# Patient Record
Sex: Female | Born: 1980 | Race: White | Hispanic: No | Smoking: Never smoker
Health system: Southern US, Community
[De-identification: ages and names within clinical notes are randomized; demographics above are authoritative.]

## PROBLEM LIST (undated history)

## (undated) VITALS — BP 113/89 | HR 106 | Temp 97.6°F | Resp 17 | Ht 67.0 in | Wt 161.0 lb

## (undated) DIAGNOSIS — M3500 Sicca syndrome, unspecified: Secondary | ICD-10-CM

## (undated) DIAGNOSIS — R17 Unspecified jaundice: Secondary | ICD-10-CM

## (undated) DIAGNOSIS — J45909 Unspecified asthma, uncomplicated: Secondary | ICD-10-CM

## (undated) DIAGNOSIS — R748 Abnormal levels of other serum enzymes: Secondary | ICD-10-CM

## (undated) HISTORY — PX: ORIF FINGER / THUMB FRACTURE: SUR932

## (undated) HISTORY — PX: TONSILLECTOMY AND ADENOIDECTOMY: SHX28

## (undated) HISTORY — PX: SUBMANDIBULAR GLAND EXCISION: SHX2456

---

## 2013-05-16 DIAGNOSIS — F339 Major depressive disorder, recurrent, unspecified: Secondary | ICD-10-CM | POA: Insufficient documentation

## 2016-01-28 DIAGNOSIS — E538 Deficiency of other specified B group vitamins: Secondary | ICD-10-CM | POA: Insufficient documentation

## 2016-07-20 DIAGNOSIS — M3500 Sicca syndrome, unspecified: Secondary | ICD-10-CM | POA: Insufficient documentation

## 2016-07-20 DIAGNOSIS — M26609 Unspecified temporomandibular joint disorder, unspecified side: Secondary | ICD-10-CM | POA: Insufficient documentation

## 2016-09-22 DIAGNOSIS — N870 Mild cervical dysplasia: Secondary | ICD-10-CM | POA: Insufficient documentation

## 2020-12-18 ENCOUNTER — Other Ambulatory Visit: Payer: Self-pay

## 2020-12-18 ENCOUNTER — Encounter (HOSPITAL_BASED_OUTPATIENT_CLINIC_OR_DEPARTMENT_OTHER): Payer: Self-pay | Admitting: *Deleted

## 2020-12-18 ENCOUNTER — Emergency Department (HOSPITAL_BASED_OUTPATIENT_CLINIC_OR_DEPARTMENT_OTHER): Payer: 59

## 2020-12-18 ENCOUNTER — Emergency Department (HOSPITAL_BASED_OUTPATIENT_CLINIC_OR_DEPARTMENT_OTHER)
Admission: EM | Admit: 2020-12-18 | Discharge: 2020-12-18 | Disposition: A | Discharge status: Home / Self Care | Payer: 59 | Attending: Emergency Medicine | Admitting: Emergency Medicine

## 2020-12-18 DIAGNOSIS — R748 Abnormal levels of other serum enzymes: Secondary | ICD-10-CM

## 2020-12-18 DIAGNOSIS — R7401 Elevation of levels of liver transaminase levels: Secondary | ICD-10-CM | POA: Diagnosis not present

## 2020-12-18 DIAGNOSIS — J45909 Unspecified asthma, uncomplicated: Secondary | ICD-10-CM | POA: Diagnosis not present

## 2020-12-18 DIAGNOSIS — R110 Nausea: Secondary | ICD-10-CM | POA: Insufficient documentation

## 2020-12-18 DIAGNOSIS — N3000 Acute cystitis without hematuria: Secondary | ICD-10-CM | POA: Insufficient documentation

## 2020-12-18 DIAGNOSIS — D72829 Elevated white blood cell count, unspecified: Secondary | ICD-10-CM | POA: Insufficient documentation

## 2020-12-18 DIAGNOSIS — R109 Unspecified abdominal pain: Secondary | ICD-10-CM

## 2020-12-18 DIAGNOSIS — R630 Anorexia: Secondary | ICD-10-CM | POA: Insufficient documentation

## 2020-12-18 DIAGNOSIS — R17 Unspecified jaundice: Secondary | ICD-10-CM | POA: Diagnosis not present

## 2020-12-18 DIAGNOSIS — K824 Cholesterolosis of gallbladder: Secondary | ICD-10-CM | POA: Diagnosis not present

## 2020-12-18 DIAGNOSIS — R1011 Right upper quadrant pain: Secondary | ICD-10-CM | POA: Diagnosis present

## 2020-12-18 DIAGNOSIS — Z91040 Latex allergy status: Secondary | ICD-10-CM | POA: Diagnosis not present

## 2020-12-18 HISTORY — DX: Abnormal levels of other serum enzymes: R74.8

## 2020-12-18 HISTORY — DX: Sjogren syndrome, unspecified: M35.00

## 2020-12-18 HISTORY — DX: Unspecified jaundice: R17

## 2020-12-18 HISTORY — DX: Unspecified asthma, uncomplicated: J45.909

## 2020-12-18 LAB — CBC WITH DIFFERENTIAL/PLATELET
Abs Immature Granulocytes: 0.02 10*3/uL (ref 0.00–0.07)
Basophils Absolute: 0.1 10*3/uL (ref 0.0–0.1)
Basophils Relative: 1 %
Eosinophils Absolute: 0.1 10*3/uL (ref 0.0–0.5)
Eosinophils Relative: 1 %
HCT: 29.4 % — ABNORMAL LOW (ref 36.0–46.0)
Hemoglobin: 10.1 g/dL — ABNORMAL LOW (ref 12.0–15.0)
Immature Granulocytes: 0 %
Lymphocytes Relative: 38 %
Lymphs Abs: 2.7 10*3/uL (ref 0.7–4.0)
MCH: 39.8 pg — ABNORMAL HIGH (ref 26.0–34.0)
MCHC: 34.4 g/dL (ref 30.0–36.0)
MCV: 115.7 fL — ABNORMAL HIGH (ref 80.0–100.0)
Monocytes Absolute: 0.9 10*3/uL (ref 0.1–1.0)
Monocytes Relative: 13 %
Neutro Abs: 3.3 10*3/uL (ref 1.7–7.7)
Neutrophils Relative %: 47 %
Platelets: 240 10*3/uL (ref 150–400)
RBC: 2.54 MIL/uL — ABNORMAL LOW (ref 3.87–5.11)
RDW: 14.4 % (ref 11.5–15.5)
Smear Review: NORMAL
WBC: 7.1 10*3/uL (ref 4.0–10.5)
nRBC: 0 % (ref 0.0–0.2)

## 2020-12-18 LAB — COMPREHENSIVE METABOLIC PANEL
ALT: 77 U/L — ABNORMAL HIGH (ref 0–44)
AST: 231 U/L — ABNORMAL HIGH (ref 15–41)
Albumin: 2.7 g/dL — ABNORMAL LOW (ref 3.5–5.0)
Alkaline Phosphatase: 308 U/L — ABNORMAL HIGH (ref 38–126)
Anion gap: 14 (ref 5–15)
BUN: 8 mg/dL (ref 6–20)
CO2: 30 mmol/L (ref 22–32)
Calcium: 9.6 mg/dL (ref 8.9–10.3)
Chloride: 94 mmol/L — ABNORMAL LOW (ref 98–111)
Creatinine, Ser: 0.63 mg/dL (ref 0.44–1.00)
GFR, Estimated: >60 mL/min (ref >60)
Glucose, Bld: 102 mg/dL — ABNORMAL HIGH (ref 70–99)
Potassium: 3.3 mmol/L — ABNORMAL LOW (ref 3.5–5.1)
Sodium: 138 mmol/L (ref 135–145)
Total Bilirubin: 3.5 mg/dL — ABNORMAL HIGH (ref 0.3–1.2)
Total Protein: 6 g/dL — ABNORMAL LOW (ref 6.5–8.1)

## 2020-12-18 LAB — PREGNANCY, URINE: Preg Test, Ur: NEGATIVE

## 2020-12-18 LAB — URINALYSIS, MICROSCOPIC (REFLEX)

## 2020-12-18 LAB — URINALYSIS, ROUTINE W REFLEX MICROSCOPIC
Glucose, UA: NEGATIVE mg/dL
Hgb urine dipstick: NEGATIVE
Ketones, ur: NEGATIVE mg/dL
Nitrite: POSITIVE — AB
Protein, ur: NEGATIVE mg/dL
Specific Gravity, Urine: 1.015 (ref 1.005–1.030)
pH: 7 (ref 5.0–8.0)

## 2020-12-18 LAB — PROTIME-INR
INR: 1.1 (ref 0.8–1.2)
Prothrombin Time: 14.2 seconds (ref 11.4–15.2)

## 2020-12-18 LAB — ACETAMINOPHEN LEVEL: Acetaminophen (Tylenol), Serum: <10 ug/mL — ABNORMAL LOW (ref 10–30)

## 2020-12-18 LAB — LIPASE, BLOOD: Lipase: 42 U/L (ref 11–51)

## 2020-12-18 MED ORDER — SODIUM CHLORIDE 0.9 % IV BOLUS
1000.0000 mL | Freq: Once | INTRAVENOUS | Status: AC
  Administered 2020-12-18: 1000 mL via INTRAVENOUS

## 2020-12-18 MED ORDER — ONDANSETRON HCL 4 MG/2ML IJ SOLN
4.0000 mg | Freq: Once | INTRAMUSCULAR | Status: AC
  Administered 2020-12-18: 4 mg via INTRAVENOUS
  Filled 2020-12-18: qty 2

## 2020-12-18 MED ORDER — CEPHALEXIN 500 MG PO CAPS: 500.0000 mg | ORAL_CAPSULE | Freq: Four times a day (QID) | ORAL | 0 refills | Status: AC

## 2020-12-18 NOTE — ED Provider Notes (Signed)
[facility] EMERGENCY DEPARTMENT Provider Note   CSN: [id number] Arrival date & time: 12/18/20  1319     History Chief Complaint  Patient presents with  . Abdominal Pain    Jeanette Elliott is a 40 y.o. female with PMH/o elevated LFTs, Jaundice, Sjogren's who presents for evaluation of abdominal pain, persistent jaundice, generalized weakness that has been ongoing for for the past couple months.  She reports that this has been ongoing for about 3 months.  She saw urgent care last month for evaluation of her symptoms and was told that her liver enzymes were elevated.  She was also told that she was jaundiced.  She was told to follow-up with her GI doctor which she has.  She was told that she would need an MRCP or an MRI which she has not got scheduled yet.  She comes in today because she still has symptoms.  She states she feels weak and has pain in her upper abdomen, particular right upper quadrant.  She has not noted any fevers but states that she feels like the jaundice is getting slightly worse.  She has had nausea but no vomiting.  She has had decreased appetite.  She states that she has lost 45 pounds in about a 84-monthtime span.  She denies any fevers, vomiting, urinary complaints, diarrhea.  She does report that she would take about 200 mg of Tylenol a day for the last for 5 days but has not excessively use Tylenol.  Denies any history of IV drug use.  Denies any history outside travel outside the country.  She denies any current alcohol drinks.  She states before this happened, she would drink about 2-3 wine glasses on the weekend.  She denies any chest pain, difficulty breathing.  The history is provided by the patient.       Past Medical History:  Diagnosis Date  . Asthma   . Elevated liver enzymes   . Jaundice   . Sjogren's syndrome ([place]     There are no problems to display for this patient.   Past Surgical History:  Procedure Laterality Date  . SUBMANDIBULAR  GLAND EXCISION    . TONSILLECTOMY       OB History   No obstetric history on file.     History reviewed. No pertinent family history.  Social History   Tobacco Use  . Smoking status: Never Smoker  . Smokeless tobacco: Never Used  Substance Use Topics  . Alcohol use: Not Currently  . Drug use: Not Currently    Home Medications Prior to Admission medications   Medication Sig Start Date End Date Taking? Authorizing Provider  cephALEXin (KEFLEX) 500 MG capsule Take 1 capsule (500 mg total) by mouth 4 (four) times daily for 7 days. 12/18/20 12/25/20 Yes LVolanda Napoleon PA-C    Allergies    Latex  Review of Systems   Review of Systems  Constitutional: Positive for unexpected weight change. Negative for fever.  Respiratory: Negative for cough and shortness of breath.   Cardiovascular: Negative for chest pain.  Gastrointestinal: Positive for abdominal pain and nausea. Negative for vomiting.  Genitourinary: Negative for dysuria and hematuria.  Skin: Positive for color change.  Neurological: Positive for weakness (generalized). Negative for headaches.  All other systems reviewed and are negative.   Physical Exam Updated Vital Signs BP 92/74 (BP Location: Left Arm)   Pulse 94   Temp 98.5 F (36.9 C) (Oral)   Resp 18   Ht  5' 7"  (1.702 m)   Wt 54.4 kg   SpO2 100%   BMI 18.79 kg/m   Physical Exam Vitals and nursing note reviewed.  Constitutional:      Appearance: Normal appearance. She is well-developed.  HENT:     Head: Normocephalic and atraumatic.  Eyes:     General: Lids are normal. Scleral icterus present.     Conjunctiva/sclera: Conjunctivae normal.     Pupils: Pupils are equal, round, and reactive to light.  Cardiovascular:     Rate and Rhythm: Normal rate and regular rhythm.     Pulses: Normal pulses.     Heart sounds: Normal heart sounds. No murmur heard. No friction rub. No gallop.   Pulmonary:     Effort: Pulmonary effort is normal.     Breath  sounds: Normal breath sounds.     Comments: Lungs clear to auscultation bilaterally.  Symmetric chest rise.  No wheezing, rales, rhonchi. Abdominal:     Palpations: Abdomen is soft. Abdomen is not rigid.     Tenderness: There is abdominal tenderness in the right upper quadrant. There is no guarding.     Comments: Abdomen is soft, non-distended.  Tenderness palpation right upper quadrant.  No rigidity, guarding.  No CVA tenderness noted bilaterally.  Musculoskeletal:        General: Normal range of motion.     Cervical back: Full passive range of motion without pain.  Skin:    General: Skin is warm and dry.     Capillary Refill: Capillary refill takes less than 2 seconds.     Coloration: Skin is jaundiced.  Neurological:     Mental Status: She is alert and oriented to person, place, and time.  Psychiatric:        Speech: Speech normal.     ED Results / Procedures / Treatments   Labs (all labs ordered are listed, but only abnormal results are displayed) Labs Reviewed  COMPREHENSIVE METABOLIC PANEL - Abnormal; Notable for the following components:      Result Value   Potassium 3.3 (*)    Chloride 94 (*)    Glucose, Bld 102 (*)    Total Protein 6.0 (*)    Albumin 2.7 (*)    AST 231 (*)    ALT 77 (*)    Alkaline Phosphatase 308 (*)    Total Bilirubin 3.5 (*)    All other components within normal limits  CBC WITH DIFFERENTIAL/PLATELET - Abnormal; Notable for the following components:   RBC 2.54 (*)    Hemoglobin 10.1 (*)    HCT 29.4 (*)    MCV 115.7 (*)    MCH 39.8 (*)    All other components within normal limits  URINALYSIS, ROUTINE W REFLEX MICROSCOPIC - Abnormal; Notable for the following components:   APPearance HAZY (*)    Bilirubin Urine MODERATE (*)    Nitrite POSITIVE (*)    Leukocytes,Ua TRACE (*)    All other components within normal limits  ACETAMINOPHEN LEVEL - Abnormal; Notable for the following components:   Acetaminophen (Tylenol), Serum <10 (*)    All  other components within normal limits  URINALYSIS, MICROSCOPIC (REFLEX) - Abnormal; Notable for the following components:   Bacteria, UA FEW (*)    All other components within normal limits  LIPASE, BLOOD  PREGNANCY, URINE  PROTIME-INR    EKG None  Radiology US Abdomen Limited RUQ (LIVER/GB)  Result Date: 12/18/2020 CLINICAL DATA:  Upper abdominal pain EXAM: ULTRASOUND ABDOMEN LIMITED RIGHT  UPPER QUADRANT COMPARISON:  None. FINDINGS: Gallbladder: There is sludge in the gallbladder. There is a 6 mm echogenic focus in the gallbladder which neither moves nor shadows, a presumed polyp. There are no echogenic foci in the gallbladder which move and shadow as is expected with cholelithiasis. There is no gallbladder wall thickening or pericholecystic fluid. No sonographic Murphy sign noted by sonographer. Common bile duct: Diameter: 2 mm. No intrahepatic or extrahepatic biliary duct dilatation. Liver: No focal lesion identified. Liver echogenicity is increased diffusely. Portal vein is patent on color Doppler imaging with normal direction of blood flow towards the liver. Other: None. IMPRESSION: 1. A 6 mm apparent polyp within the gallbladder. Per consensus guidelines, a polyp of this size does not warrant additional imaging surveillance. There is sludge in the gallbladder. No evident gallstones. No gallbladder wall thickening or pericholecystic fluid. 2. Diffuse increase in liver echogenicity, a finding indicative of hepatic steatosis. No focal liver lesions evident on this study. It must be cautioned that the sensitivity of ultrasound for detection of focal liver lesions is diminished in this circumstance. Electronically Signed   By: Lowella Grip III M.D.   On: 12/18/2020 16:03    Procedures Procedures   Medications Ordered in ED Medications  sodium chloride 0.9 % bolus 1,000 mL ( Intravenous Stopped 12/18/20 1745)  ondansetron (ZOFRAN) injection 4 mg (4 mg Intravenous Given 12/18/20 1643)    ED  Course  I have reviewed the triage vital signs and the nursing notes.  Pertinent labs & imaging results that were available during my care of the patient were reviewed by me and considered in my medical decision making (see chart for details).    MDM Rules/Calculators/A&P                          40 year old female who presents for evaluation of generalized weakness, jaundice, abdominal pain.  She reports is been ongoing for few months.  Saw urgent care last month and was full told to follow-up with GI which she has seen.  The one in her do an MRI but she has not gotten that done yet.  Comes in today's with continued symptoms.  On mission arrival, she is afebrile nontoxic-appearing.  Vital signs are stable.  She does appear jaundiced.  On exam, she has tenderness palpation on the right upper quadrant.  Concern for hepatobiliary etiology.  We will plan to check labs, imaging.  Reviewed lab work from 11/27/2020 which showed total bili of 5.8, alk phos of 187, AST of 261, ALT of 101.  Reactive hepatitis A on her hepatitis panel.  CMP today shows potassium of 3.3, AST of 231, ALT of 77, alk phos of 308, total bili of 3.5.  INR is 1.1.  Acetaminophen level is normal.  Lipase is unremarkable.  CBC shows hemoglobin of 10.1.  Ultrasound shows a 6 mm apparent polyp within the gallbladder.  No evidence of gallbladder wall thickening, pericholecystic fluid.  There is mention of potential hepatic steatosis  Urine pregnancy negative.  UA does show positive nitrites, leukocytes.  Plan to treat.  Patient with no known drug allergies.  Discussed results with mom and patient.  At this time, her LFTs are improving.  I did discuss with mom and patient that she would need further GI follow-up with further testing per their recommendation.  At this time, patient does not meet criteria for admission.  She has not having any vomiting and labs are improving.  We will give her a referral to GI.  Additionally, I have  consulted transition of care to help with instrument of her medical care. At this time, patient exhibits no emergent life-threatening condition that require further evaluation in ED. Discussed patient with Dr. Joya Gaskins who is agreeable. Patient had ample opportunity for questions and discussion. All patient's questions were answered with full understanding. Strict return precautions discussed. Patient expresses understanding and agreement to plan.   Portions of this note were generated with Lobbyist. Dictation errors may occur despite best attempts at proofreading.   Final Clinical Impression(s) / ED Diagnoses Final diagnoses:  Abdominal pain  Gallbladder polyp  Acute cystitis without hematuria  Elevated liver enzymes    Rx / DC Orders ED Discharge Orders         Ordered    cephALEXin (KEFLEX) 500 MG capsule  4 times daily        12/18/20 1853           Desma Mcgregor 12/18/20 2138    Arnaldo Natal, MD 12/18/20 2212

## 2020-12-18 NOTE — Discharge Instructions (Signed)
As we discussed, your liver enzymes are still elevated today but did appear slightly improved from your previous work-up.  We have provided you a referral to a GI doctor.  Please follow-up with them.  Additionally, we have provided you with [facility] with care to help establish further medical care.  We have also consulted our social work team.  They will be in contact with you.  As we discussed, your ultrasound today showed evidence of a gallbladder polyp.  Your urine did show evidence of infection.  We will plan to treat.  Take antibiotics as directed.  Return the emergency department for any worsening abdominal pain, persistent vomiting, fevers or any other worsening concerning symptoms.

## 2020-12-18 NOTE — ED Triage Notes (Signed)
C/o abd pain and bloating x 1 week , jaundice and increased liver enzymes x 3 months

## 2021-01-07 ENCOUNTER — Telehealth: Payer: Self-pay | Admitting: Gastroenterology

## 2021-01-07 NOTE — Telephone Encounter (Signed)
Hi Dr. Bryan Lemma, this new pt is scheduled for an appt with you on 01/22/21. I came across to her appt because her mother called inquiring about any cancellations. I realized that pt recently saw a GI at [facility] but due to her insurance being out of network she could not continue her care over there. Since we missed to advise you about that to review her records first and advise on scheduling. Would you want me to r/s her 5/12 appt? Thank you.

## 2021-01-11 ENCOUNTER — Other Ambulatory Visit: Payer: Self-pay

## 2021-01-11 ENCOUNTER — Encounter (HOSPITAL_COMMUNITY): Payer: Self-pay | Admitting: Emergency Medicine

## 2021-01-11 ENCOUNTER — Emergency Department (HOSPITAL_COMMUNITY): Payer: 59

## 2021-01-11 ENCOUNTER — Inpatient Hospital Stay (HOSPITAL_COMMUNITY)
Admission: EM | Admit: 2021-01-11 | Discharge: 2021-01-17 | DRG: 441 | Disposition: A | Discharge status: Home / Self Care | Payer: 59 | Attending: Family Medicine | Admitting: Family Medicine

## 2021-01-11 DIAGNOSIS — K219 Gastro-esophageal reflux disease without esophagitis: Secondary | ICD-10-CM | POA: Diagnosis present

## 2021-01-11 DIAGNOSIS — R140 Abdominal distension (gaseous): Secondary | ICD-10-CM | POA: Diagnosis present

## 2021-01-11 DIAGNOSIS — D589 Hereditary hemolytic anemia, unspecified: Secondary | ICD-10-CM | POA: Diagnosis present

## 2021-01-11 DIAGNOSIS — S2232XA Fracture of one rib, left side, initial encounter for closed fracture: Secondary | ICD-10-CM | POA: Diagnosis present

## 2021-01-11 DIAGNOSIS — F603 Borderline personality disorder: Secondary | ICD-10-CM | POA: Diagnosis present

## 2021-01-11 DIAGNOSIS — R17 Unspecified jaundice: Secondary | ICD-10-CM | POA: Diagnosis present

## 2021-01-11 DIAGNOSIS — J90 Pleural effusion, not elsewhere classified: Secondary | ICD-10-CM | POA: Diagnosis present

## 2021-01-11 DIAGNOSIS — S2232XD Fracture of one rib, left side, subsequent encounter for fracture with routine healing: Secondary | ICD-10-CM

## 2021-01-11 DIAGNOSIS — K7210 Chronic hepatic failure without coma: Secondary | ICD-10-CM | POA: Diagnosis present

## 2021-01-11 DIAGNOSIS — R634 Abnormal weight loss: Secondary | ICD-10-CM

## 2021-01-11 DIAGNOSIS — R188 Other ascites: Secondary | ICD-10-CM | POA: Diagnosis present

## 2021-01-11 DIAGNOSIS — E46 Unspecified protein-calorie malnutrition: Secondary | ICD-10-CM | POA: Insufficient documentation

## 2021-01-11 DIAGNOSIS — F410 Panic disorder [episodic paroxysmal anxiety] without agoraphobia: Secondary | ICD-10-CM | POA: Diagnosis present

## 2021-01-11 DIAGNOSIS — M7989 Other specified soft tissue disorders: Secondary | ICD-10-CM | POA: Diagnosis present

## 2021-01-11 DIAGNOSIS — R160 Hepatomegaly, not elsewhere classified: Secondary | ICD-10-CM | POA: Diagnosis present

## 2021-01-11 DIAGNOSIS — E538 Deficiency of other specified B group vitamins: Secondary | ICD-10-CM | POA: Diagnosis not present

## 2021-01-11 DIAGNOSIS — I959 Hypotension, unspecified: Secondary | ICD-10-CM | POA: Diagnosis present

## 2021-01-11 DIAGNOSIS — I10 Essential (primary) hypertension: Secondary | ICD-10-CM | POA: Diagnosis present

## 2021-01-11 DIAGNOSIS — D539 Nutritional anemia, unspecified: Secondary | ICD-10-CM | POA: Diagnosis not present

## 2021-01-11 DIAGNOSIS — Z9889 Other specified postprocedural states: Secondary | ICD-10-CM

## 2021-01-11 DIAGNOSIS — W19XXXA Unspecified fall, initial encounter: Secondary | ICD-10-CM

## 2021-01-11 DIAGNOSIS — J45909 Unspecified asthma, uncomplicated: Secondary | ICD-10-CM | POA: Diagnosis present

## 2021-01-11 DIAGNOSIS — F4310 Post-traumatic stress disorder, unspecified: Secondary | ICD-10-CM | POA: Diagnosis present

## 2021-01-11 DIAGNOSIS — E871 Hypo-osmolality and hyponatremia: Secondary | ICD-10-CM | POA: Diagnosis present

## 2021-01-11 DIAGNOSIS — R748 Abnormal levels of other serum enzymes: Secondary | ICD-10-CM

## 2021-01-11 DIAGNOSIS — W1830XA Fall on same level, unspecified, initial encounter: Secondary | ICD-10-CM | POA: Diagnosis present

## 2021-01-11 DIAGNOSIS — D689 Coagulation defect, unspecified: Secondary | ICD-10-CM | POA: Diagnosis present

## 2021-01-11 DIAGNOSIS — F419 Anxiety disorder, unspecified: Secondary | ICD-10-CM | POA: Diagnosis not present

## 2021-01-11 DIAGNOSIS — K760 Fatty (change of) liver, not elsewhere classified: Secondary | ICD-10-CM | POA: Diagnosis present

## 2021-01-11 DIAGNOSIS — B159 Hepatitis A without hepatic coma: Secondary | ICD-10-CM | POA: Diagnosis present

## 2021-01-11 DIAGNOSIS — E43 Unspecified severe protein-calorie malnutrition: Secondary | ICD-10-CM | POA: Diagnosis present

## 2021-01-11 DIAGNOSIS — K8010 Calculus of gallbladder with chronic cholecystitis without obstruction: Secondary | ICD-10-CM | POA: Diagnosis present

## 2021-01-11 DIAGNOSIS — R109 Unspecified abdominal pain: Secondary | ICD-10-CM

## 2021-01-11 DIAGNOSIS — F32A Depression, unspecified: Secondary | ICD-10-CM | POA: Diagnosis present

## 2021-01-11 DIAGNOSIS — E872 Acidosis: Secondary | ICD-10-CM | POA: Diagnosis not present

## 2021-01-11 DIAGNOSIS — M3500 Sicca syndrome, unspecified: Secondary | ICD-10-CM | POA: Diagnosis present

## 2021-01-11 DIAGNOSIS — K5900 Constipation, unspecified: Secondary | ICD-10-CM | POA: Diagnosis not present

## 2021-01-11 DIAGNOSIS — E8809 Other disorders of plasma-protein metabolism, not elsewhere classified: Secondary | ICD-10-CM | POA: Diagnosis not present

## 2021-01-11 DIAGNOSIS — E876 Hypokalemia: Secondary | ICD-10-CM | POA: Diagnosis present

## 2021-01-11 LAB — CBC
HCT: 27.7 % — ABNORMAL LOW (ref 36.0–46.0)
Hemoglobin: 9.8 g/dL — ABNORMAL LOW (ref 12.0–15.0)
MCH: 39.5 pg — ABNORMAL HIGH (ref 26.0–34.0)
MCHC: 35.4 g/dL (ref 30.0–36.0)
MCV: 111.7 fL — ABNORMAL HIGH (ref 80.0–100.0)
Platelets: 205 10*3/uL (ref 150–400)
RBC: 2.48 MIL/uL — ABNORMAL LOW (ref 3.87–5.11)
RDW: 16.8 % — ABNORMAL HIGH (ref 11.5–15.5)
WBC: 10.3 10*3/uL (ref 4.0–10.5)
nRBC: 0 % (ref 0.0–0.2)

## 2021-01-11 LAB — APTT: aPTT: 33 seconds (ref 24–36)

## 2021-01-11 LAB — BASIC METABOLIC PANEL
Anion gap: 19 — ABNORMAL HIGH (ref 5–15)
BUN: <5 mg/dL — ABNORMAL LOW (ref 6–20)
CO2: 27 mmol/L (ref 22–32)
Calcium: 8.3 mg/dL — ABNORMAL LOW (ref 8.9–10.3)
Chloride: 84 mmol/L — ABNORMAL LOW (ref 98–111)
Creatinine, Ser: 0.7 mg/dL (ref 0.44–1.00)
GFR, Estimated: >60 mL/min (ref >60)
Glucose, Bld: 89 mg/dL (ref 70–99)
Potassium: 3.2 mmol/L — ABNORMAL LOW (ref 3.5–5.1)
Sodium: 130 mmol/L — ABNORMAL LOW (ref 135–145)

## 2021-01-11 LAB — PROTIME-INR
INR: 1.3 — ABNORMAL HIGH (ref 0.8–1.2)
Prothrombin Time: 15.7 seconds — ABNORMAL HIGH (ref 11.4–15.2)

## 2021-01-11 LAB — HEPATIC FUNCTION PANEL
ALT: 99 U/L — ABNORMAL HIGH (ref 0–44)
AST: 295 U/L — ABNORMAL HIGH (ref 15–41)
Albumin: 2.4 g/dL — ABNORMAL LOW (ref 3.5–5.0)
Alkaline Phosphatase: 540 U/L — ABNORMAL HIGH (ref 38–126)
Bilirubin, Direct: 5.2 mg/dL — ABNORMAL HIGH (ref 0.0–0.2)
Indirect Bilirubin: 4.3 mg/dL — ABNORMAL HIGH (ref 0.3–0.9)
Total Bilirubin: 9.5 mg/dL — ABNORMAL HIGH (ref 0.3–1.2)
Total Protein: 5.9 g/dL — ABNORMAL LOW (ref 6.5–8.1)

## 2021-01-11 LAB — AMMONIA: Ammonia: 42 umol/L — ABNORMAL HIGH (ref 9–35)

## 2021-01-11 LAB — HIV ANTIBODY (ROUTINE TESTING W REFLEX): HIV Screen 4th Generation wRfx: NONREACTIVE

## 2021-01-11 LAB — LACTIC ACID, PLASMA
Lactic Acid, Venous: 7.7 mmol/L (ref 0.5–1.9)
Lactic Acid, Venous: 5.1 mmol/L (ref 0.5–1.9)

## 2021-01-11 LAB — VITAMIN B12: Vitamin B-12: 547 pg/mL (ref 180–914)

## 2021-01-11 LAB — FOLATE: Folate: 3.5 ng/mL — ABNORMAL LOW (ref >5.9)

## 2021-01-11 MED ORDER — SODIUM CHLORIDE 0.9 % IV BOLUS
1000.0000 mL | Freq: Once | INTRAVENOUS | Status: AC
  Administered 2021-01-11: 1000 mL via INTRAVENOUS

## 2021-01-11 MED ORDER — LORAZEPAM 2 MG/ML IJ SOLN
1.0000 mg | Freq: Once | INTRAMUSCULAR | Status: AC
  Administered 2021-01-11: 1 mg via INTRAVENOUS
  Filled 2021-01-11: qty 1

## 2021-01-11 MED ORDER — GADOBUTROL 1 MMOL/ML IV SOLN
5.0000 mL | Freq: Once | INTRAVENOUS | Status: AC | PRN
  Administered 2021-01-11: 5 mL via INTRAVENOUS

## 2021-01-11 MED ORDER — FENTANYL CITRATE (PF) 100 MCG/2ML IJ SOLN
50.0000 ug | Freq: Once | INTRAMUSCULAR | Status: AC
  Administered 2021-01-11: 50 ug via INTRAVENOUS
  Filled 2021-01-11: qty 2

## 2021-01-11 MED ORDER — SODIUM CHLORIDE 0.9 % IV SOLN: INTRAVENOUS | Status: DC

## 2021-01-11 MED ORDER — HYDROXYZINE HCL 25 MG PO TABS
25.0000 mg | ORAL_TABLET | Freq: Three times a day (TID) | ORAL | Status: DC | PRN
  Administered 2021-01-12 – 2021-01-13 (×3): 25 mg via ORAL
  Filled 2021-01-11 (×3): qty 1

## 2021-01-11 MED ORDER — HEPARIN SODIUM (PORCINE) 5000 UNIT/ML IJ SOLN
5000.0000 [IU] | Freq: Once | INTRAMUSCULAR | Status: AC
  Administered 2021-01-11: 5000 [IU] via SUBCUTANEOUS
  Filled 2021-01-11: qty 1

## 2021-01-11 MED ORDER — KETOROLAC TROMETHAMINE 15 MG/ML IJ SOLN
15.0000 mg | Freq: Four times a day (QID) | INTRAMUSCULAR | Status: DC | PRN
  Administered 2021-01-11 – 2021-01-14 (×7): 15 mg via INTRAVENOUS
  Filled 2021-01-11 (×7): qty 1

## 2021-01-11 MED ORDER — ACETAMINOPHEN 325 MG PO TABS: 325.0000 mg | ORAL_TABLET | Freq: Four times a day (QID) | ORAL | Status: DC | PRN
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
## 2021-01-11 NOTE — ED Triage Notes (Signed)
Pt fell and hit side of bath tub.  C/o L rib pain- states she can feel "popping".  States she fell due to pain and swelling in lower extremities.

## 2021-01-11 NOTE — ED Notes (Signed)
ED Provider at bedside. 
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
## 2021-01-11 NOTE — ED Notes (Signed)
Tried calling report, RN not available.

## 2021-01-11 NOTE — ED Notes (Signed)
Report called to floor RN

## 2021-01-11 NOTE — Progress Notes (Signed)
[facility]  Received consult late this afternoon, unfortunately patient went to MRI and I could not see her.  We are aware of her arrival and recommend trending LFTs in the morning. We will see her in consultation in the morning.  Ellouise Newer, PA-C

## 2021-01-12 DIAGNOSIS — R634 Abnormal weight loss: Secondary | ICD-10-CM

## 2021-01-12 DIAGNOSIS — D539 Nutritional anemia, unspecified: Secondary | ICD-10-CM | POA: Diagnosis not present

## 2021-01-12 DIAGNOSIS — R748 Abnormal levels of other serum enzymes: Secondary | ICD-10-CM | POA: Diagnosis not present

## 2021-01-12 DIAGNOSIS — R17 Unspecified jaundice: Secondary | ICD-10-CM

## 2021-01-12 DIAGNOSIS — S2232XD Fracture of one rib, left side, subsequent encounter for fracture with routine healing: Secondary | ICD-10-CM

## 2021-01-12 DIAGNOSIS — E538 Deficiency of other specified B group vitamins: Secondary | ICD-10-CM

## 2021-01-12 LAB — HEPATITIS PANEL, ACUTE
HCV Ab: NONREACTIVE
Hep A IgM: NONREACTIVE
Hep B C IgM: NONREACTIVE
Hepatitis B Surface Ag: NONREACTIVE

## 2021-01-12 LAB — COMPREHENSIVE METABOLIC PANEL
ALT: 84 U/L — ABNORMAL HIGH (ref 0–44)
AST: 259 U/L — ABNORMAL HIGH (ref 15–41)
Albumin: 2 g/dL — ABNORMAL LOW (ref 3.5–5.0)
Alkaline Phosphatase: 421 U/L — ABNORMAL HIGH (ref 38–126)
Anion gap: 14 (ref 5–15)
BUN: <5 mg/dL — ABNORMAL LOW (ref 6–20)
CO2: 26 mmol/L (ref 22–32)
Calcium: 7.7 mg/dL — ABNORMAL LOW (ref 8.9–10.3)
Chloride: 91 mmol/L — ABNORMAL LOW (ref 98–111)
Creatinine, Ser: 0.8 mg/dL (ref 0.44–1.00)
GFR, Estimated: >60 mL/min (ref >60)
Glucose, Bld: 88 mg/dL (ref 70–99)
Potassium: 3.5 mmol/L (ref 3.5–5.1)
Sodium: 131 mmol/L — ABNORMAL LOW (ref 135–145)
Total Bilirubin: 9.1 mg/dL — ABNORMAL HIGH (ref 0.3–1.2)
Total Protein: 5 g/dL — ABNORMAL LOW (ref 6.5–8.1)

## 2021-01-12 LAB — URINALYSIS, ROUTINE W REFLEX MICROSCOPIC
Glucose, UA: NEGATIVE mg/dL
Hgb urine dipstick: NEGATIVE
Ketones, ur: NEGATIVE mg/dL
Leukocytes,Ua: NEGATIVE
Nitrite: NEGATIVE
Protein, ur: 30 mg/dL — AB
Specific Gravity, Urine: 1.021 (ref 1.005–1.030)
pH: 5 (ref 5.0–8.0)

## 2021-01-12 LAB — CBC
HCT: 22.7 % — ABNORMAL LOW (ref 36.0–46.0)
Hemoglobin: 8 g/dL — ABNORMAL LOW (ref 12.0–15.0)
MCH: 39.6 pg — ABNORMAL HIGH (ref 26.0–34.0)
MCHC: 35.2 g/dL (ref 30.0–36.0)
MCV: 112.4 fL — ABNORMAL HIGH (ref 80.0–100.0)
Platelets: 172 10*3/uL (ref 150–400)
RBC: 2.02 MIL/uL — ABNORMAL LOW (ref 3.87–5.11)
RDW: 17.2 % — ABNORMAL HIGH (ref 11.5–15.5)
WBC: 8.6 10*3/uL (ref 4.0–10.5)
nRBC: 0 % (ref 0.0–0.2)

## 2021-01-12 MED ORDER — ACETAMINOPHEN 325 MG PO TABS
325.0000 mg | ORAL_TABLET | Freq: Four times a day (QID) | ORAL | Status: DC
  Administered 2021-01-12 – 2021-01-17 (×18): 325 mg via ORAL
  Filled 2021-01-12 (×19): qty 1

## 2021-01-12 MED ORDER — OXYCODONE HCL 5 MG PO TABS
2.5000 mg | ORAL_TABLET | Freq: Four times a day (QID) | ORAL | Status: DC | PRN
  Administered 2021-01-12 – 2021-01-14 (×5): 2.5 mg via ORAL
  Filled 2021-01-12 (×5): qty 1

## 2021-01-12 MED ORDER — PRENATAL MULTIVITAMIN CH
1.0000 | ORAL_TABLET | Freq: Every day | ORAL | Status: DC
  Administered 2021-01-12 – 2021-01-13 (×2): 1 via ORAL
  Filled 2021-01-12 (×3): qty 1
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
## 2021-01-12 NOTE — Progress Notes (Signed)
   01/11/21 2100  Assess: MEWS Score  Temp 98.3 F (36.8 C)  BP 100/66  Pulse Rate (!) 109  Resp 17  Level of Consciousness Alert  SpO2 100 %  O2 Device Room Air  Assess: MEWS Score  MEWS Temp 0  MEWS Systolic 1  MEWS Pulse 1  MEWS RR 0  MEWS LOC 0  MEWS Score 2  MEWS Score Color Yellow  Assess: if the MEWS score is Yellow or Red  Were vital signs taken at a resting state? Yes  Focused Assessment No change from prior assessment  Early Detection of Sepsis Score *See Row Information* Low  MEWS guidelines implemented *See Row Information* Yes  Treat  MEWS Interventions Administered prn meds/treatments  Pain Scale 0-10  Pain Score 6  Pain Type Acute pain  Pain Location Rib cage  Pain Orientation Left  Pain Descriptors / Indicators Discomfort;Sore  Pain Frequency Intermittent  Pain Onset Gradual  Patients Stated Pain Goal 2  Pain Intervention(s) Medication (See eMAR)  Take Vital Signs  Increase Vital Sign Frequency  Yellow: Q 2hr X 2 then Q 4hr X 2, if remains yellow, continue Q 4hrs  Escalate  MEWS: Escalate Yellow: discuss with charge nurse/RN and consider discussing with provider and RRT  Notify: Charge Nurse/RN  Name of Charge Nurse/RN Notified Nellie  Date Charge Nurse/RN Notified 01/11/21  Time Charge Nurse/RN Notified 2100  Notify: Provider  Provider Name/Title Dr. Nancy Fetter  Date Provider Notified 01/11/21  Time Provider Notified 2045  Notification Type Page  Notification Reason Other (Comment)  Provider response No new orders  Date of Provider Response 01/11/21  Time of Provider Response 2046  Document  Patient Outcome Stabilized after interventions  Progress note created (see row info) Yes

## 2021-01-12 NOTE — TOC CAGE-AID Note (Addendum)
Transition of Care [facility]) - CAGE-AID Screening   Patient Details  Name: Jeanette Elliott MRN: [id number] Date of Birth: 1980-11-02  Transition of Care [facility]) CM/SW Contact:    Bethann Berkshire, [place] Phone Number: 01/12/2021, 2:46 PM   Clinical Narrative:  Pt reports she doesn't currently drink alcohol and does not use any other substances. Reports she stopped drinking alcohol a few months ago. Was drinking 1-2 drinks on weekends.   CAGE-AID Screening:    Have You Ever Felt You Ought to Cut Down on Your Drinking or Drug Use?: No Have People Annoyed You By Critizing Your Drinking Or Drug Use?: No Have You Felt Bad Or Guilty About Your Drinking Or Drug Use?: No Have You Ever Had a Drink or Used Drugs First Thing In The Morning to Steady Your Nerves or to Get Rid of a Hangover?: No CAGE-AID Score: 0  Substance Abuse Education Offered: No

## 2021-01-13 DIAGNOSIS — D539 Nutritional anemia, unspecified: Secondary | ICD-10-CM | POA: Diagnosis not present

## 2021-01-13 DIAGNOSIS — F419 Anxiety disorder, unspecified: Secondary | ICD-10-CM

## 2021-01-13 DIAGNOSIS — E538 Deficiency of other specified B group vitamins: Secondary | ICD-10-CM | POA: Diagnosis not present

## 2021-01-13 DIAGNOSIS — R634 Abnormal weight loss: Secondary | ICD-10-CM | POA: Diagnosis not present

## 2021-01-13 DIAGNOSIS — R748 Abnormal levels of other serum enzymes: Secondary | ICD-10-CM | POA: Diagnosis not present

## 2021-01-13 DIAGNOSIS — R17 Unspecified jaundice: Secondary | ICD-10-CM | POA: Diagnosis not present

## 2021-01-13 LAB — CBC
HCT: 22 % — ABNORMAL LOW (ref 36.0–46.0)
Hemoglobin: 7.4 g/dL — ABNORMAL LOW (ref 12.0–15.0)
MCH: 39.2 pg — ABNORMAL HIGH (ref 26.0–34.0)
MCHC: 33.6 g/dL (ref 30.0–36.0)
MCV: 116.4 fL — ABNORMAL HIGH (ref 80.0–100.0)
Platelets: 150 10*3/uL (ref 150–400)
RBC: 1.89 MIL/uL — ABNORMAL LOW (ref 3.87–5.11)
RDW: 17.6 % — ABNORMAL HIGH (ref 11.5–15.5)
WBC: 7.5 10*3/uL (ref 4.0–10.5)
nRBC: 0 % (ref 0.0–0.2)

## 2021-01-13 LAB — LACTIC ACID, PLASMA
Lactic Acid, Venous: 2.6 mmol/L (ref 0.5–1.9)
Lactic Acid, Venous: 2.2 mmol/L (ref 0.5–1.9)

## 2021-01-13 LAB — IRON AND TIBC
Iron: 117 ug/dL (ref 28–170)
Saturation Ratios: 93 % — ABNORMAL HIGH (ref 10.4–31.8)
TIBC: 126 ug/dL — ABNORMAL LOW (ref 250–450)
UIBC: 9 ug/dL

## 2021-01-13 LAB — COMPREHENSIVE METABOLIC PANEL
ALT: 79 U/L — ABNORMAL HIGH (ref 0–44)
AST: 274 U/L — ABNORMAL HIGH (ref 15–41)
Albumin: 1.8 g/dL — ABNORMAL LOW (ref 3.5–5.0)
Alkaline Phosphatase: 388 U/L — ABNORMAL HIGH (ref 38–126)
Anion gap: 10 (ref 5–15)
BUN: <5 mg/dL — ABNORMAL LOW (ref 6–20)
CO2: 25 mmol/L (ref 22–32)
Calcium: 7.5 mg/dL — ABNORMAL LOW (ref 8.9–10.3)
Chloride: 96 mmol/L — ABNORMAL LOW (ref 98–111)
Creatinine, Ser: 0.77 mg/dL (ref 0.44–1.00)
GFR, Estimated: >60 mL/min (ref >60)
Glucose, Bld: 91 mg/dL (ref 70–99)
Potassium: 3 mmol/L — ABNORMAL LOW (ref 3.5–5.1)
Sodium: 131 mmol/L — ABNORMAL LOW (ref 135–145)
Total Bilirubin: 9.7 mg/dL — ABNORMAL HIGH (ref 0.3–1.2)
Total Protein: 4.6 g/dL — ABNORMAL LOW (ref 6.5–8.1)

## 2021-01-13 LAB — RETICULOCYTES
Immature Retic Fract: 22.8 % — ABNORMAL HIGH (ref 2.3–15.9)
RBC.: 1.98 MIL/uL — ABNORMAL LOW (ref 3.87–5.11)
Retic Count, Absolute: 84.7 10*3/uL (ref 19.0–186.0)
Retic Ct Pct: 4.3 % — ABNORMAL HIGH (ref 0.4–3.1)

## 2021-01-13 LAB — IGG: IgG (Immunoglobin G), Serum: 902 mg/dL (ref 586–1602)

## 2021-01-13 LAB — ANTINUCLEAR ANTIBODIES, IFA: ANA Ab, IFA: NEGATIVE

## 2021-01-13 LAB — MAGNESIUM: Magnesium: 1.5 mg/dL — ABNORMAL LOW (ref 1.7–2.4)

## 2021-01-13 LAB — FERRITIN: Ferritin: 1071 ng/mL — ABNORMAL HIGH (ref 11–307)

## 2021-01-13 LAB — PHOSPHORUS: Phosphorus: 3.1 mg/dL (ref 2.5–4.6)

## 2021-01-13 LAB — CERULOPLASMIN: Ceruloplasmin: 25 mg/dL (ref 19.0–39.0)

## 2021-01-13 LAB — MITOCHONDRIAL ANTIBODIES: Mitochondrial M2 Ab, IgG: <20 Units (ref 0.0–20.0)

## 2021-01-13 LAB — LACTATE DEHYDROGENASE: LDH: 296 U/L — ABNORMAL HIGH (ref 98–192)

## 2021-01-13 MED ORDER — ESCITALOPRAM OXALATE 10 MG PO TABS
5.0000 mg | ORAL_TABLET | Freq: Once | ORAL | Status: AC
  Administered 2021-01-13: 5 mg via ORAL
  Filled 2021-01-13: qty 1

## 2021-01-13 MED ORDER — FOLIC ACID 1 MG PO TABS
1.0000 mg | ORAL_TABLET | Freq: Every day | ORAL | Status: DC
  Administered 2021-01-13 – 2021-01-17 (×5): 1 mg via ORAL
  Filled 2021-01-13 (×5): qty 1

## 2021-01-13 MED ORDER — MAGNESIUM SULFATE 2 GM/50ML IV SOLN
2.0000 g | Freq: Once | INTRAVENOUS | Status: AC
  Administered 2021-01-13: 2 g via INTRAVENOUS
  Filled 2021-01-13: qty 50

## 2021-01-13 MED ORDER — HYDROXYZINE HCL 25 MG PO TABS
50.0000 mg | ORAL_TABLET | Freq: Three times a day (TID) | ORAL | Status: DC | PRN
  Administered 2021-01-13 – 2021-01-16 (×2): 50 mg via ORAL
  Filled 2021-01-13 (×2): qty 2

## 2021-01-13 MED ORDER — POTASSIUM CHLORIDE CRYS ER 20 MEQ PO TBCR
40.0000 meq | EXTENDED_RELEASE_TABLET | Freq: Once | ORAL | Status: AC
  Administered 2021-01-13: 40 meq via ORAL
  Filled 2021-01-13: qty 2

## 2021-01-13 MED ORDER — ESCITALOPRAM OXALATE 10 MG PO TABS
10.0000 mg | ORAL_TABLET | Freq: Every day | ORAL | Status: DC
  Administered 2021-01-14 – 2021-01-17 (×4): 10 mg via ORAL
  Filled 2021-01-13 (×4): qty 1
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
## 2021-01-13 NOTE — TOC Initial Note (Signed)
Transition of Care [facility]) - Initial/Assessment Note    Patient Details  Name: Jeanette Elliott MRN: [id number] Date of Birth: [birth year]  [facility] CM/SW Contact:    Bethann Berkshire, [place] Phone Number: 01/13/2021, 3:30 PM  Clinical Narrative:                  CSW met with pt and pt mother bedside in response to financial assistance and psych resources. Pt is consents to her mother being included in conversation. CSW spoke with pt and pt mother day prior about medicaid but is able to go into further detail about the process and differences between Department of Social Services and IT trainer. Pt states she has applied for disability online about 2 weeks ago and has not heard anything back yet. Pt was also approved for family planning medicaid but is also applying for disability medicaid. CSW informs pt and pt mother that disability lawyers are available if they need assistance in applying for disability.   CSW also speaks to pt about psych/therapy resources. Pt reports she has been seen in the past through "[facility]" but that has been years. CSW provides list of psych/therapy resources. Pt and pt mother are familiar with how to look up what providers are covered by pt's insurance. CSW is able to provide some education on different levels of care and treatment modalities.   No other needs identified at this time. Please reach out to [facility] if there are any additional needs.   Expected Discharge Plan: Home/Self Care Barriers to Discharge: Continued Medical Work up   Patient Goals and CMS Choice        Expected Discharge Plan and Services Expected Discharge Plan: Home/Self Care       Living arrangements for the past 2 months: Single Family Home                                      Prior Living Arrangements/Services Living arrangements for the past 2 months: Single Family Home Lives with:: Self Patient language and need for  interpreter reviewed:: Yes Do you feel safe going back to the place where you live?: Yes      Need for Family Participation in Patient Care: Yes (Comment) Care giver support system in place?: Yes (comment)   Criminal Activity/Legal Involvement Pertinent to Current Situation/Hospitalization: No - Comment as needed  Activities of Daily Living      Permission Sought/Granted   Permission granted to share information with : Yes, Verbal Permission Granted  Share Information with NAME: Sherhonda, Gaspar (Mother)   [phone] (Mobile)           Emotional Assessment Appearance:: Appears stated age Attitude/Demeanor/Rapport: Engaged Affect (typically observed): Accepting,Flat Orientation: : Oriented to Self,Oriented to Place,Oriented to  Time,Oriented to Situation Alcohol / Substance Use: Not Applicable Psych Involvement: No (comment)  Admission diagnosis:  Jaundice [R17] Fall [W19.XXXA] Elevated liver enzymes [R74.8] Patient Active Problem List   Diagnosis Date Noted  . Closed fracture of rib of left side with routine healing   . Elevated bilirubin   . Loss of weight   . Macrocytic anemia   . Folate deficiency   . Jaundice 01/11/2021  . Elevated liver enzymes   . Fall    PCP:  Pcp, No Pharmacy:   [facility] [phone] - [place], [place] - [address] AT [facility]  [place] [address] [place] [address] Phone: [phone] Fax: [phone]     Social Determinants of Health (SDOH) Interventions    Readmission Risk Interventions No flowsheet data found.

## 2021-01-13 NOTE — Progress Notes (Addendum)
Attending physician's note   I have taken an interval history, reviewed the chart and examined the patient. I agree with the Advanced Practitioner's note, impression, and recommendations as outlined.   40 year old female with progressive elevation in liver enzymes and bilirubin over the last several months with associated weight loss, jaundice, icteric sclera.  Now undergoing extensive evaluation which so far is notable for the following:  - Ferritin 1071, iron 117, TIBC 126, sat 93% - Normal IgG, ceruloplasmin, AMA - Elevated LDH at 296 - Liver enzymes largely stable with AST/ALT/ALP 274/79/288 and T bili 9.7 - Folate 3.5 - H/H 7.4/22 - HIV negative, acute viral hepatitis panel negative - INR 1.3 - Lactate 7.7 --> 5.1 --> 2.2 - MRCP: Enlarged liver 25 cm, CBD 5 mm without any intrahepatic duct dilation.  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  GB sludge with small stones without cholecystitis.  Normal pancreas.  Heterogenous appearing liver  Interestingly, while she reports a history of IDA dating back to high school previously treated with IV iron, she has not had iron supplementation many years.  Labs now with iron overload.  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  1) Elevated liver enzymes 2) Hyperbilirubinemia 3) Elevated alkaline phosphatase 4 Jaundiced/Icteric sclera 5) Lactic acidosis 6) Hypoalbuminemia 7) Folate deficiency 8) Iron saturation elevated/Hemochromatosis 9) Mild coagulopathy 10) Hepatomegaly on MRI 11) Gallbladder sludge/small stones 12) Weight loss/Decreased appetite 13) Macrocytic anemia  - Evaluation so far c/w hemochromatosis - Check HFE - Schedule liver biopsy with IR tomorrow - Hematology consult - We will follow-up on multiple pending labs to rule out concomitant liver disease - Continue trending lactate to normal.  Unsure if this is a typical type a lactic acidosis or potentially type B lactic acidosis from  liver dysfunction - Start folic acid - Continue trending liver enzymes and daily CBC - P.o. intake as tolerated - GI service will continue to follow  Gerrit Heck, DO, FACG [phone] office                                                                      Daily Rounding Note  01/13/2021, 10:10 AM  LOS: 2 days   SUBJECTIVE:   Chief complaint: Jaundice, elevated LFTs.     Complains of pain in left upper quadrant.  Still depressed appetite.  No bowel movements for more than a couple of days.  OBJECTIVE:         Vital signs in last 24 hours:    Temp:  [98.1 F (36.7 C)-98.8 F (37.1 C)] 98.4 F (36.9 C) (05/03 0956) Pulse Rate:  [97-103] 103 (05/03 0956) Resp:  [15-18] 16 (05/03 0956) BP: (87-99)/(57-67) 90/66 (05/03 0956) SpO2:  [95 %-99 %] 97 % (05/03 0956) Last BM Date: 01/10/21 There were no vitals filed for this visit. General: Scleral icterus.  NAD.  Looks mildly unwell Heart: RRR. Chest: Clear bilaterally.  No labored breathing Abdomen: Soft.  Tender left upper quadrant, epigastrium.  No guarding or rebound.  Active bowel sounds.  No distention Extremities: No CCE Neuro/Psych: Alert.  Appropriate.  Moves all 4 limbs.  Depressed/flat affect.  Intake/Output from previous day: 05/02 0701 - 05/03 0700 In:  2176.5 [P.O.:300; I.V.:1876.5] Out: 101 [Urine:101]  Intake/Output this shift: No intake/output data recorded.  Lab Results: Recent Labs    01/11/21 1029 01/12/21 0053 01/13/21 0221  WBC 10.3 8.6 7.5  HGB 9.8* 8.0* 7.4*  HCT 27.7* 22.7* 22.0*  PLT 205 172 150   BMET Recent Labs    01/11/21 1029 01/12/21 0053 01/13/21 0221  NA 130* 131* 131*  K 3.2* 3.5 3.0*  CL 84* 91* 96*  CO2 27 26 25   GLUCOSE 89 88 91  BUN <5* <5* <5*  CREATININE 0.70 0.80 0.77  CALCIUM 8.3* 7.7* 7.5*   LFT Recent Labs    01/11/21 1029 01/12/21 0053 01/13/21 0221  PROT 5.9* 5.0* 4.6*  ALBUMIN 2.4* 2.0* 1.8*  AST 295* 259* 274*  ALT 99* 84* 79*   ALKPHOS 540* 421* 388*  BILITOT 9.5* 9.1* 9.7*  BILIDIR 5.2*  --   --   IBILI 4.3*  --   --    PT/INR Recent Labs    01/11/21 1437  LABPROT 15.7*  INR 1.3*   Hepatitis Panel Recent Labs    01/12/21 0053  HEPBSAG NON REACTIVE  HCVAB NON REACTIVE  HEPAIGM NON REACTIVE  HEPBIGM NON REACTIVE    Studies/Results: MR 3D Recon At Scanner  Result Date: 01/11/2021 CLINICAL DATA:  Jaundice EXAM: MRI ABDOMEN WITHOUT AND WITH CONTRAST (INCLUDING MRCP) TECHNIQUE: Multiplanar multisequence MR imaging of the abdomen was performed both before and after the administration of intravenous contrast. Heavily T2-weighted images of the biliary and pancreatic ducts were obtained, and three-dimensional MRCP images were rendered by post processing. CONTRAST:  49m GADAVIST GADOBUTROL 1 MMOL/ML IV SOLN COMPARISON:  12/18/2020 FINDINGS: Lower chest: Lung bases are clear. Hepatobiliary: The liver is enlarged measuring 25.5 cm in craniocaudal length. There is mild background heterogeneity of the liver parenchyma on post-contrast images, consistent with intrinsic liver disease or hepatitis. No focal lesions. There is no intrahepatic biliary duct dilation. Common bile duct measures 5 mm in diameter, normal. No filling defects or choledocholithiasis. Multiple small gallstones and sludge are seen layering dependently within the gallbladder, with no evidence of cholecystitis. Pancreas: No mass, inflammatory changes, or other parenchymal abnormality identified. Spleen:  Within normal limits in size and appearance. Adrenals/Urinary Tract: No masses identified. No evidence of hydronephrosis. The adrenals are unremarkable. Stomach/Bowel: No evidence of bowel obstruction. Vascular/Lymphatic: Normal caliber of the abdominal aorta. No pathologic adenopathy. Other: Trace ascites along the liver capsule. No evidence of pneumoperitoneum. No abdominal wall hernia. Musculoskeletal: No suspicious bone lesions identified. IMPRESSION: 1.  Hepatomegaly, with heterogeneous background liver parenchyma on postcontrast images suggesting chronic liver disease or hepatitis. No focal liver abnormalities. 2. Cholelithiasis and gallbladder sludge. No evidence of cholecystitis or choledocholithiasis. 3. Normal common bile duct.  No evidence of biliary obstruction. Electronically Signed   By: MRanda NgoM.D.   On: 01/11/2021 20:16   MR ABDOMEN MRCP W WO CONTAST  Result Date: 01/11/2021 CLINICAL DATA:  Jaundice EXAM: MRI ABDOMEN WITHOUT AND WITH CONTRAST (INCLUDING MRCP) TECHNIQUE: Multiplanar multisequence MR imaging of the abdomen was performed both before and after the administration of intravenous contrast. Heavily T2-weighted images of the biliary and pancreatic ducts were obtained, and three-dimensional MRCP images were rendered by post processing. CONTRAST:  520mGADAVIST GADOBUTROL 1 MMOL/ML IV SOLN COMPARISON:  12/18/2020 FINDINGS: Lower chest: Lung bases are clear. Hepatobiliary: The liver is enlarged measuring 25.5 cm in craniocaudal length. There is mild background heterogeneity of the liver parenchyma on post-contrast images, consistent with intrinsic liver  disease or hepatitis. No focal lesions. There is no intrahepatic biliary duct dilation. Common bile duct measures 5 mm in diameter, normal. No filling defects or choledocholithiasis. Multiple small gallstones and sludge are seen layering dependently within the gallbladder, with no evidence of cholecystitis. Pancreas: No mass, inflammatory changes, or other parenchymal abnormality identified. Spleen:  Within normal limits in size and appearance. Adrenals/Urinary Tract: No masses identified. No evidence of hydronephrosis. The adrenals are unremarkable. Stomach/Bowel: No evidence of bowel obstruction. Vascular/Lymphatic: Normal caliber of the abdominal aorta. No pathologic adenopathy. Other: Trace ascites along the liver capsule. No evidence of pneumoperitoneum. No abdominal wall hernia.  Musculoskeletal: No suspicious bone lesions identified. IMPRESSION: 1. Hepatomegaly, with heterogeneous background liver parenchyma on postcontrast images suggesting chronic liver disease or hepatitis. No focal liver abnormalities. 2. Cholelithiasis and gallbladder sludge. No evidence of cholecystitis or choledocholithiasis. 3. Normal common bile duct.  No evidence of biliary obstruction. Electronically Signed   By: Randa Ngo M.D.   On: 01/11/2021 20:16    ASSESMENT:   *    Several months of jaundice, new hepatomegaly. Meld- Na 22. Acute hep A/B/C serologies negative LDH elevated at 296.   Lactic acid elevated, improved 2.6.  Reticulocytes count elevated at 4.3, retic absolute normal, immature retic elevated 22.8.   IgG normal.  Ceruloplasmin normal. Pending labs include microsomal Ab, IgA, IgM, haptoglobin, A1 AT, smooth muscle Ab, homocystine, methylmalonic acid, thiamine LFTs remain elevated, rising T bili but falling alk phos, falling transaminases.  *   Coagulopathy, INR 1.3  *    Longstanding IDA dating back to age 15.  *    Lower extremity edema.  *    Trauma/fall related left rib fracture.  *   History of anemia with iron and B12 deficiency in past.  Currently iron, iron sats and ferritin at or above normal.  TIBC decreased to 126.   PLAN   *    Await findings of multiple labs.  *   Who was to make contact with hematology?  GI or hospitalist?  Or do we wait until all the labs are back before deciding about hematology input.      Azucena Freed  01/13/2021, 10:10 AM Phone [phone]

## 2021-01-14 ENCOUNTER — Inpatient Hospital Stay (HOSPITAL_COMMUNITY): Payer: 59

## 2021-01-14 DIAGNOSIS — E538 Deficiency of other specified B group vitamins: Secondary | ICD-10-CM | POA: Diagnosis not present

## 2021-01-14 DIAGNOSIS — S2232XD Fracture of one rib, left side, subsequent encounter for fracture with routine healing: Secondary | ICD-10-CM | POA: Diagnosis not present

## 2021-01-14 DIAGNOSIS — R17 Unspecified jaundice: Secondary | ICD-10-CM | POA: Diagnosis not present

## 2021-01-14 DIAGNOSIS — D539 Nutritional anemia, unspecified: Secondary | ICD-10-CM | POA: Diagnosis not present

## 2021-01-14 DIAGNOSIS — R748 Abnormal levels of other serum enzymes: Secondary | ICD-10-CM | POA: Diagnosis not present

## 2021-01-14 LAB — CBC
HCT: 23 % — ABNORMAL LOW (ref 36.0–46.0)
Hemoglobin: 7.7 g/dL — ABNORMAL LOW (ref 12.0–15.0)
MCH: 39.3 pg — ABNORMAL HIGH (ref 26.0–34.0)
MCHC: 33.5 g/dL (ref 30.0–36.0)
MCV: 117.3 fL — ABNORMAL HIGH (ref 80.0–100.0)
Platelets: 154 10*3/uL (ref 150–400)
RBC: 1.96 MIL/uL — ABNORMAL LOW (ref 3.87–5.11)
RDW: 17.2 % — ABNORMAL HIGH (ref 11.5–15.5)
WBC: 8.5 10*3/uL (ref 4.0–10.5)
nRBC: 0.2 % (ref 0.0–0.2)

## 2021-01-14 LAB — COMPREHENSIVE METABOLIC PANEL
ALT: 79 U/L — ABNORMAL HIGH (ref 0–44)
AST: 247 U/L — ABNORMAL HIGH (ref 15–41)
Albumin: 1.9 g/dL — ABNORMAL LOW (ref 3.5–5.0)
Alkaline Phosphatase: 394 U/L — ABNORMAL HIGH (ref 38–126)
Anion gap: 9 (ref 5–15)
BUN: 8 mg/dL (ref 6–20)
CO2: 23 mmol/L (ref 22–32)
Calcium: 8 mg/dL — ABNORMAL LOW (ref 8.9–10.3)
Chloride: 99 mmol/L (ref 98–111)
Creatinine, Ser: 0.88 mg/dL (ref 0.44–1.00)
GFR, Estimated: >60 mL/min (ref >60)
Glucose, Bld: 95 mg/dL (ref 70–99)
Potassium: 4.4 mmol/L (ref 3.5–5.1)
Sodium: 131 mmol/L — ABNORMAL LOW (ref 135–145)
Total Bilirubin: 9.5 mg/dL — ABNORMAL HIGH (ref 0.3–1.2)
Total Protein: 4.7 g/dL — ABNORMAL LOW (ref 6.5–8.1)

## 2021-01-14 LAB — LACTIC ACID, PLASMA
Lactic Acid, Venous: 3.8 mmol/L (ref 0.5–1.9)
Lactic Acid, Venous: 2.3 mmol/L (ref 0.5–1.9)
Lactic Acid, Venous: 3.2 mmol/L (ref 0.5–1.9)

## 2021-01-14 LAB — IGA: IgA: 316 mg/dL (ref 87–352)

## 2021-01-14 LAB — ANTI-SMOOTH MUSCLE ANTIBODY, IGG: F-Actin IgG: 9 Units (ref 0–19)

## 2021-01-14 LAB — HOMOCYSTEINE: Homocysteine: 19.3 umol/L — ABNORMAL HIGH (ref 0.0–14.5)

## 2021-01-14 LAB — HAPTOGLOBIN: Haptoglobin: 66 mg/dL (ref 33–278)

## 2021-01-14 LAB — IGM: IgM (Immunoglobulin M), Srm: 199 mg/dL (ref 26–217)

## 2021-01-14 LAB — ALPHA-1-ANTITRYPSIN: A-1 Antitrypsin, Ser: 127 mg/dL (ref 100–188)

## 2021-01-14 LAB — PATHOLOGIST SMEAR REVIEW

## 2021-01-14 MED ORDER — FENTANYL CITRATE (PF) 100 MCG/2ML IJ SOLN
INTRAMUSCULAR | Status: AC
  Filled 2021-01-14: qty 2

## 2021-01-14 MED ORDER — LIDOCAINE-EPINEPHRINE 1 %-1:100000 IJ SOLN
INTRAMUSCULAR | Status: AC
  Filled 2021-01-14: qty 1

## 2021-01-14 MED ORDER — GELATIN ABSORBABLE 12-7 MM EX MISC
CUTANEOUS | Status: AC
  Filled 2021-01-14: qty 1

## 2021-01-14 MED ORDER — MIDAZOLAM HCL 2 MG/2ML IJ SOLN
INTRAMUSCULAR | Status: AC
  Filled 2021-01-14: qty 2

## 2021-01-14 MED ORDER — MIDAZOLAM HCL 2 MG/2ML IJ SOLN
INTRAMUSCULAR | Status: AC | PRN
  Administered 2021-01-14: 0.5 mg via INTRAVENOUS

## 2021-01-14 MED ORDER — FENTANYL CITRATE (PF) 100 MCG/2ML IJ SOLN
INTRAMUSCULAR | Status: AC | PRN
  Administered 2021-01-14: 25 ug via INTRAVENOUS

## 2021-01-14 MED ORDER — SODIUM CHLORIDE 0.9 % IV SOLN: INTRAVENOUS | Status: DC
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
## 2021-01-14 NOTE — Procedures (Signed)
Pre Procedure Dx: Elevated LFTs Post Procedural Dx: Same  Technically successful US guided biopsy of right lobe of the liver.  EBL: None  No immediate complications.   Jay Kortnie Stovall, MD Pager #: [phone]    

## 2021-01-14 NOTE — Progress Notes (Addendum)
[place] [facility] ROUNDING NOTE   Subjective: No acute events overnight.  Visited with the patient this afternoon following her liver biopsy this morning.  Other than some mild postoperative soreness, no complaints.  Sitting upright and trying to eat lunch.   Objective: Vital signs in last 24 hours: Temp:  [97.8 F (36.6 C)-98.4 F (36.9 C)] 98 F (36.7 C) (05/04 0442) Pulse Rate:  [88-103] 93 (05/04 0927) Resp:  [14-19] 15 (05/04 0927) BP: (81-98)/(52-73) 89/61 (05/04 0927) SpO2:  [95 %-100 %] 100 % (05/04 0927) Last BM Date: 01/10/21  AAOx3, well conversive Icteric sclera Jaundiced complexion  Intake/Output from previous day: 05/03 0701 - 05/04 0700 In: 830.7 [P.O.:300; I.V.:530.7] Out: -  Intake/Output this shift: No intake/output data recorded.   Lab Results: Recent Labs    01/12/21 0053 01/13/21 0221 01/14/21 0246  WBC 8.6 7.5 8.5  HGB 8.0* 7.4* 7.7*  PLT 172 150 154  MCV 112.4* 116.4* 117.3*   BMET Recent Labs    01/12/21 0053 01/13/21 0221 01/14/21 0246  NA 131* 131* 131*  K 3.5 3.0* 4.4  CL 91* 96* 99  CO2 26 25 23   GLUCOSE 88 91 95  BUN <5* <5* 8  CREATININE 0.80 0.77 0.88  CALCIUM 7.7* 7.5* 8.0*   LFT Recent Labs    01/11/21 1029 01/12/21 0053 01/13/21 0221 01/14/21 0246  PROT 5.9* 5.0* 4.6* 4.7*  ALBUMIN 2.4* 2.0* 1.8* 1.9*  AST 295* 259* 274* 247*  ALT 99* 84* 79* 79*  ALKPHOS 540* 421* 388* 394*  BILITOT 9.5* 9.1* 9.7* 9.5*  BILIDIR 5.2*  --   --   --   IBILI 4.3*  --   --   --    PT/INR Recent Labs    01/11/21 1437  INR 1.3*      Imaging/Other results: No results found.    Assessment and Plan:  40 year old female with progressive elevation in liver enzymes and bilirubin over the last several months with associated weight loss, jaundice, icteric sclera.  Now undergoing extensive evaluation which so far is notable for the following:  - Ferritin 1071, iron 117, TIBC 126, sat 93% - Normal/negative IgG, IgA,  IgM, ceruloplasmin, AMA, ANA - Elevated LDH at 296; normal haptoglobin - Liver enzymes largely stable with AST/ALT/ALP 274/79/288 and T bili 9.7 - Folate 3.5 - H/H 7.7/23, PLT 154 - HIV negative, acute viral hepatitis panel negative - INR 1.3 - Lactate 7.7-->5.1 --> 2.2 --> 3.8 - Pending labs include: HFE, ASMA, thiamine, anti-LKM - Liver biopsy completed 01/14/2021: Will follow-up on pathology - MRCP: Enlarged liver 25 cm, CBD 5 mm without any intrahepatic duct dilation. 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 GB sludge with small stones without cholecystitis. Normal pancreas. Heterogenous appearing liver  1) Hemochromatosis/Iron overload  2) Hyperbilirubinemia 3) Elevated alkaline phosphatase 4 Jaundiced/Icteric sclera 5) Lactic acidosis 6) Hypoalbuminemia 7) Macrocytic anemia/folate deficiency 8) Elevated liver enzymes 9) Mild coagulopathy 10) Hepatomegaly on MRI 11) Gallbladder sludge/small stones 12) Weight loss/Decreased appetite  - Hematology/Oncology consult - Continue trending lactate.  Unsure if this is a typical type a lactic acidosis or potentially type B lactic acidosis from liver dysfunction - Continue daily liver enzyme trend - Will follow up on liver biopsy results and pending labs - Will need continued Social Work assistance - Continue encouraging p.o. intake - GI service will continue to follow   Lavena Bullion, DO  01/14/2021, 9:37 AM [facility] Pager [phone]

## 2021-01-14 NOTE — Progress Notes (Signed)
Family Medicine Teaching Service Daily Progress Note Intern Pager: [phone]  Patient name: Jeanette Elliott Medical record number: [id number] Date of birth: 01-Aug-1981 Age: 40 y.o. Gender: female  Primary Care Provider: Pcp, No Consultants: GI Code Status: FULL  Pt Overview and Major Events to Date:  5/1: Admitted; MRCP performed   Assessment and Plan: Jeanette Smithis a 40 y.o.femalewhopresenteds/p fall with new 9th rib fracture, also jaundiced with elevated LFT's and bilirubinconcerning for CLF of unknown etiology. PMH is significant foranemia, B12 and iron-deficiency, PTSD/Anxiety, HTN, lower extremity swelling, Hepatitis A.  Jaundice, Elevated LFT's and Bilirubin Iron panel yesterday showed elevated ferritin- concerning for hemochromatosis though could also be elevated due to being an acute phase reactant. Iron level normal with decreased TIBC and increased sat ratio. Additional work up thus far has revealed ANA, mitochondrial antibodies, IgA, IgM and alpha-1-antitrypsin negative. Lactic acid continues to fluctuate 2.2>3.8>2.3. CMP this morning showed AST 247, ALT 79, Alk Phos 394, Total bili 9.5. Overall LFT's are grossly stable.  -GI following, appreciate recommendations: Plan for liver biopsy today by IR  -Labs ordered/pending:   Hemochromatosis DNA   LKM Ab  Thiamine   IgG   Anti-smooth muscle antibody   Lactic acid   Left 9th Rib Fracture: acute, stable  From fall from standing.  -Scheduled low-dose acetaminophen, 325 mg q6h  -Ketorolac 15 mg q6h PRN moderate pain -Oxycodone 2.5 mg q6h PRN severe pain  Hypokalemia: Resolved Potassium 3>4.4. -Continue to encourage PO as tolerated  Cholelithiasis  Biliary Sludge Multiple small gallstones and biliary sludge appreciated on MRCP without cholecystitis or choledocholithiasis. -Consider outpatient elective cholecystectomy   Macrocytic Anemia  Folate Deficiency Hgb stable 7.4>7.7. Transfusion threshold is <7. Drop  was likely dilutional. Iron panel suggestive of potential iron-overloaded state given ferritin >1,000. Could also be elevated in the setting of illness. Haptoglobin WNL. -MMA and Homocysteine pending  -Started on folic acid supplementation -Monitor CBC -Pending labs include: MMA, Homocysteine, peripheral smear  Anxiety, Depression  PTSD  Borderline Personality Disorder Psych consulted yesterday. Patient started on Lexapro and Vistaril increased. -Psych following, appreciate care and consultation -Hydroxyzine 50 mg TID PRN anxiety -Lexapro 10 mg daily  Sjogrens: chronic, stable Naphazoline HCl 1 drop in both eyes BID.  GERD  Takes Omeprazole 20 mg every morning.  -Holding, can add back as needed   FEN/GI: Regular diet  PPx: SCD's   Status is: Inpatient  Remains inpatient appropriate because:Ongoing diagnostic testing needed not appropriate for outpatient work up and Inpatient level of care appropriate due to severity of illness   Dispo: The patient is from: Home              Anticipated d/c is to: Home              Patient currently is not medically stable to d/c.   Difficult to place patient No  Subjective:  Patient continues to have some abdominal pain and rib pain.  She also feels that her anxiety is getting worse the longer she has to stay in the hospital and with the more people she sees.  She continues to express her desire to go home.  She tells me about her 32 and me results that did show that she was positive for a gene associated with hemochromatosis.  Objective: Temp:  [97.8 F (36.6 C)-98.4 F (36.9 C)] 98 F (36.7 C) (05/04 0442) Pulse Rate:  [88-103] 93 (05/04 0442) Resp:  [16-18] 18 (05/04 0442) BP: (81-90)/(52-66) 81/52 (05/04 0442) SpO2:  [96 %-  100 %] 96 % (05/04 0442) Physical Exam: General: Awake, alert, jaundiced with scleral icterus, soft spoken, anxious apeparing Cardiovascular: RRR without murmur Respiratory: CTA B without  wheezing/rhonchi/rales Extremities: Swelling has resolved, 2+ DP pulses bilaterally  Laboratory: Recent Labs  Lab 01/12/21 0053 01/13/21 0221 01/14/21 0246  WBC 8.6 7.5 8.5  HGB 8.0* 7.4* 7.7*  HCT 22.7* 22.0* 23.0*  PLT 172 150 154   Recent Labs  Lab 01/12/21 0053 01/13/21 0221 01/14/21 0246  NA 131* 131* 131*  K 3.5 3.0* 4.4  CL 91* 96* 99  CO2 26 25 23   BUN <5* <5* 8  CREATININE 0.80 0.77 0.88  CALCIUM 7.7* 7.5* 8.0*  PROT 5.0* 4.6* 4.7*  BILITOT 9.1* 9.7* 9.5*  ALKPHOS 421* 388* 394*  ALT 84* 79* 79*  AST 259* 274* 247*  GLUCOSE 88 91 95    Imaging/Diagnostic Tests: None new.   Sharion Settler, DO 01/14/2021, 6:20 AM PGY-1, [place] Intern pager: [phone], text pages welcome

## 2021-01-14 NOTE — Consult Note (Addendum)
[place]  Telephone:([phone]   HEMATOLOGY ONCOLOGY INPATIENT CONSULTATION   Jeanette Elliott  DOB: [birth year]  MR#: [id number]  CSN#: [id number]    Requesting Physician: [facility] Dr. Erin Hearing   Patient Care Team: Pcp, No as PCP - General  Reason for consult: anemia and elevated ferritin, rule out hemochromatosis  History of present illness: Patient is a 40 year old Caucasian female, with past medical history of PTSD, anxiety, hypertension, iron deficiency, B12 deficiency, who was admitted for left ninth rib fracture after fall.  He was found to have significant jaundice and abnormal liver functions, GI was consulted, and she underwent liver biopsy today.  Patient states that she has been anemic since seen a year over her high school.  She was seeing hematologist Dr. Baird Cancer at [facility] and to 2018, when she changed her job and insurance.  She previously received B12 injection and IV iron, but not compliant with oral B12 and iron.  She reports unintentional weight loss about 45 pounds in the past 6 months, and jaundice for over a month.  She has noticed progressive dyspnea on exertion for the past month, no chest pain, cough, or abdominal pain.  He has noticed abdominal bloating, decreased appetite, no nausea or vomiting around same time.  No fever or chills.  No overt GI bleeding or other mucosal bleeding.  Her in house  work-up showed elevated ferritin at 1071, elevated iron level and saturation, low TIBC, elevated LDH, normal haptoglobin, transaminitis and significant hyperbilirubinemia with total bilirubin 9.7, folate was slightly low 3.5.  Lactate was elevated, but trending down.  She underwent abdominal MRI after admission, which showed enlarged liver 25 cm, no biliary duct dilatation.  Spleen size normal, gallbladder sludge and small stones.  No acute cholecystitis.  MEDICAL HISTORY:  Past Medical History:  Diagnosis Date  . Asthma   . Elevated liver  enzymes   . Jaundice   . Sjogren's syndrome ([place])     SURGICAL HISTORY: Past Surgical History:  Procedure Laterality Date  . SUBMANDIBULAR GLAND EXCISION    . TONSILLECTOMY      SOCIAL HISTORY: Social History   Socioeconomic History  . Marital status: Divorced    Spouse name: Not on file  . Number of children: Not on file  . Years of education: Not on file  . Highest education level: Not on file  Occupational History  . Not on file  Tobacco Use  . Smoking status: Never Smoker  . Smokeless tobacco: Never Used  Substance and Sexual Activity  . Alcohol use: Not Currently  . Drug use: Not Currently  . Sexual activity: Not on file  Other Topics Concern  . Not on file  Social History Narrative  . Not on file   Social Determinants of Health   Financial Resource Strain: Not on file  Food Insecurity: Not on file  Transportation Needs: Not on file  Physical Activity: Not on file  Stress: Not on file  Social Connections: Not on file  Intimate Partner Violence: Not on file    FAMILY HISTORY: No family history on file.  ALLERGIES:  is allergic to latex.  MEDICATIONS:  Current Facility-Administered Medications  Medication Dose Route Frequency Provider Last Rate Last Admin  . acetaminophen (TYLENOL) tablet 325 mg  325 mg Oral Q6H Gifford Shave, MD   325 mg at 01/14/21 1214  . escitalopram (LEXAPRO) tablet 10 mg  10 mg Oral Daily Briant Cedar, MD   10 mg at 01/14/21  0277  . folic acid (FOLVITE) tablet 1 mg  1 mg Oral Daily Meccariello, Bailey J, DO   1 mg at 01/14/21 1026  . gelatin adsorbable (GELFOAM/SURGIFOAM) 12-7 MM sponge 12-7 mm           . hydrOXYzine (ATARAX/VISTARIL) tablet 50 mg  50 mg Oral TID PRN Briant Cedar, MD   50 mg at 01/13/21 2034  . ketorolac (TORADOL) 15 MG/ML injection 15 mg  15 mg Intravenous Q6H PRN Meccariello, Bailey J, DO   15 mg at 01/14/21 1026  . lidocaine-EPINEPHrine (XYLOCAINE W/EPI) 1 %-1:100000 (with pres) injection            . oxyCODONE (Oxy IR/ROXICODONE) immediate release tablet 2.5 mg  2.5 mg Oral Q6H PRN Gifford Shave, MD   2.5 mg at 01/14/21 0006    REVIEW OF SYSTEMS:   Constitutional: Denies fevers, chills or abnormal night sweats Eyes: Denies blurriness of vision, double vision or watery eyes Ears, nose, mouth, throat, and face: Denies mucositis or sore throat Respiratory: Denies cough, dyspnea or wheezes Cardiovascular: Denies palpitation, chest discomfort or lower extremity swelling Gastrointestinal:  Denies nausea, heartburn or change in bowel habits Skin: Denies abnormal skin rashes Lymphatics: Denies new lymphadenopathy or easy bruising Neurological:Denies numbness, tingling or new weaknesses Behavioral/Psych: Mood is stable, no new changes  All other systems were reviewed with the patient and are negative.  PHYSICAL EXAMINATION: ECOG PERFORMANCE STATUS: 3 - Symptomatic, >50% confined to bed  Vitals:   01/14/21 1013 01/14/21 1526  BP: (!) 88/63 96/73  Pulse: 95 86  Resp:    Temp: 97.7 F (36.5 C) 98.1 F (36.7 C)  SpO2: 98% 98%   There were no vitals filed for this visit.  GENERAL:alert, no distress and comfortable SKIN: (+) Pale skin and jaundice no rashes or significant lesions EYES: normal, conjunctiva are pink and non-injected, sclera clear OROPHARYNX:no exudate, no erythema and lips, buccal mucosa, and tongue normal  NECK: supple, thyroid normal size, non-tender, without nodularity LYMPH:  no palpable lymphadenopathy in the cervical, axillary or inguinal LUNGS: clear to auscultation and percussion with normal breathing effort HEART: regular rate & rhythm and no murmurs and no lower extremity edema ABDOMEN:abdomen soft, distended, significant hepatomegaly, to the level of umbilicus.  Mild tenderness in the right upper quadrant, normal bowel sounds Musculoskeletal:no cyanosis of digits and no clubbing  PSYCH: alert & oriented x 3 with fluent speech NEURO: no focal  motor/sensory deficits  LABORATORY DATA:  I have reviewed the data as listed Lab Results  Component Value Date   WBC 8.5 01/14/2021   HGB 7.7 (L) 01/14/2021   HCT 23.0 (L) 01/14/2021   MCV 117.3 (H) 01/14/2021   PLT 154 01/14/2021   Recent Labs    01/11/21 1029 01/12/21 0053 01/13/21 0221 01/14/21 0246  NA 130* 131* 131* 131*  K 3.2* 3.5 3.0* 4.4  CL 84* 91* 96* 99  CO2 _0 GLUCOSE 89 88 91 95  BUN <5* <5* <5* 8  CREATININE 0.70 0.80 0.77 0.88  CALCIUM 8.3* 7.7* 7.5* 8.0*  GFRNONAA >60 >60 >60 >60  PROT 5.9* 5.0* 4.6* 4.7*  ALBUMIN 2.4* 2.0* 1.8* 1.9*  AST 295* 259* 274* 247*  ALT 99* 84* 79* 79*  ALKPHOS 540* 421* 388* 394*  BILITOT 9.5* 9.1* 9.7* 9.5*  BILIDIR 5.2*  --   --   --   IBILI 4.3*  --   --   --     RADIOGRAPHIC STUDIES:  I have personally reviewed the radiological images as listed and agreed with the findings in the report. DG Ribs Unilateral W/Chest Left  Result Date: 01/11/2021 CLINICAL DATA:  Rib pain. EXAM: LEFT RIBS AND CHEST - 3+ VIEW COMPARISON:  None. FINDINGS: There is a nondisplaced fracture through the anterior ninth left rib. No other bony or soft tissue abnormalities. No pneumothorax. IMPRESSION: Nondisplaced fracture through the anterior ninth left rib. No pneumothorax. Electronically Signed   By: Dorise Bullion III M.D   On: 01/11/2021 10:05   MR 3D Recon At Scanner  Result Date: 01/11/2021 CLINICAL DATA:  Jaundice EXAM: MRI ABDOMEN WITHOUT AND WITH CONTRAST (INCLUDING MRCP) TECHNIQUE: Multiplanar multisequence MR imaging of the abdomen was performed both before and after the administration of intravenous contrast. Heavily T2-weighted images of the biliary and pancreatic ducts were obtained, and three-dimensional MRCP images were rendered by post processing. CONTRAST:  17m GADAVIST GADOBUTROL 1 MMOL/ML IV SOLN COMPARISON:  12/18/2020 FINDINGS: Lower chest: Lung bases are clear. Hepatobiliary: The liver is enlarged measuring 25.5 cm in  craniocaudal length. There is mild background heterogeneity of the liver parenchyma on post-contrast images, consistent with intrinsic liver disease or hepatitis. No focal lesions. There is no intrahepatic biliary duct dilation. Common bile duct measures 5 mm in diameter, normal. No filling defects or choledocholithiasis. Multiple small gallstones and sludge are seen layering dependently within the gallbladder, with no evidence of cholecystitis. Pancreas: No mass, inflammatory changes, or other parenchymal abnormality identified. Spleen:  Within normal limits in size and appearance. Adrenals/Urinary Tract: No masses identified. No evidence of hydronephrosis. The adrenals are unremarkable. Stomach/Bowel: No evidence of bowel obstruction. Vascular/Lymphatic: Normal caliber of the abdominal aorta. No pathologic adenopathy. Other: Trace ascites along the liver capsule. No evidence of pneumoperitoneum. No abdominal wall hernia. Musculoskeletal: No suspicious bone lesions identified. IMPRESSION: 1. Hepatomegaly, with heterogeneous background liver parenchyma on postcontrast images suggesting chronic liver disease or hepatitis. No focal liver abnormalities. 2. Cholelithiasis and gallbladder sludge. No evidence of cholecystitis or choledocholithiasis. 3. Normal common bile duct.  No evidence of biliary obstruction. Electronically Signed   By: MRanda NgoM.D.   On: 01/11/2021 20:16   [place]BIOPSY (LIVER)  Result Date: 01/14/2021 INDICATION: Elevated LFTs. Please perform ultrasound-guided liver biopsy for tissue diagnostic purposes. EXAM: ULTRASOUND GUIDED LIVER BIOPSY COMPARISON:  None. MEDICATIONS: None ANESTHESIA/SEDATION: Fentanyl 25 mcg IV; Versed 0.5 mg IV Total Moderate Sedation time: 12 minutes; The patient was continuously monitored during the procedure by the interventional radiology nurse under my direct supervision. COMPLICATIONS: None immediate. PROCEDURE: Informed written consent was obtained from the  patient after a discussion of the risks, benefits and alternatives to treatment. The patient understands and consents the procedure. A timeout was performed prior to the initiation of the procedure. Ultrasound scanning was performed of the right upper abdominal quadrant and the procedure was planned. The right upper abdomen was prepped and draped in the usual sterile fashion. The overlying soft tissues were anesthetized with 1% lidocaine with epinephrine. A 17 gauge, 6.8 cm co-axial needle was advanced into a peripheral aspect of the right lobe of the liver and 3 core biopsies were obtained with an 18 gauge core device under direct ultrasound guidance. The co-axial needle track was embolized with the administration of a Gel-Foam slurry. Superficial hemostasis was obtained with manual compression. Post procedural scanning was negative for definitive area of hemorrhage. A dressing was placed. The patient tolerated the procedure well without immediate post procedural complication. IMPRESSION: Technically successful ultrasound guided liver  biopsy. Electronically Signed   By: Sandi Mariscal M.D.   On: 01/14/2021 09:37   MR ABDOMEN MRCP W WO CONTAST  Result Date: 01/11/2021 CLINICAL DATA:  Jaundice EXAM: MRI ABDOMEN WITHOUT AND WITH CONTRAST (INCLUDING MRCP) TECHNIQUE: Multiplanar multisequence MR imaging of the abdomen was performed both before and after the administration of intravenous contrast. Heavily T2-weighted images of the biliary and pancreatic ducts were obtained, and three-dimensional MRCP images were rendered by post processing. CONTRAST:  39m GADAVIST GADOBUTROL 1 MMOL/ML IV SOLN COMPARISON:  12/18/2020 FINDINGS: Lower chest: Lung bases are clear. Hepatobiliary: The liver is enlarged measuring 25.5 cm in craniocaudal length. There is mild background heterogeneity of the liver parenchyma on post-contrast images, consistent with intrinsic liver disease or hepatitis. No focal lesions. There is no intrahepatic  biliary duct dilation. Common bile duct measures 5 mm in diameter, normal. No filling defects or choledocholithiasis. Multiple small gallstones and sludge are seen layering dependently within the gallbladder, with no evidence of cholecystitis. Pancreas: No mass, inflammatory changes, or other parenchymal abnormality identified. Spleen:  Within normal limits in size and appearance. Adrenals/Urinary Tract: No masses identified. No evidence of hydronephrosis. The adrenals are unremarkable. Stomach/Bowel: No evidence of bowel obstruction. Vascular/Lymphatic: Normal caliber of the abdominal aorta. No pathologic adenopathy. Other: Trace ascites along the liver capsule. No evidence of pneumoperitoneum. No abdominal wall hernia. Musculoskeletal: No suspicious bone lesions identified. IMPRESSION: 1. Hepatomegaly, with heterogeneous background liver parenchyma on postcontrast images suggesting chronic liver disease or hepatitis. No focal liver abnormalities. 2. Cholelithiasis and gallbladder sludge. No evidence of cholecystitis or choledocholithiasis. 3. Normal common bile duct.  No evidence of biliary obstruction. Electronically Signed   By: MRanda NgoM.D.   On: 01/11/2021 20:16   UKoreaAbdomen Limited RUQ (LIVER/GB)  Result Date: 12/18/2020 CLINICAL DATA:  Upper abdominal pain EXAM: ULTRASOUND ABDOMEN LIMITED RIGHT UPPER QUADRANT COMPARISON:  None. FINDINGS: Gallbladder: There is sludge in the gallbladder. There is a 6 mm echogenic focus in the gallbladder which neither moves nor shadows, a presumed polyp. There are no echogenic foci in the gallbladder which move and shadow as is expected with cholelithiasis. There is no gallbladder wall thickening or pericholecystic fluid. No sonographic Murphy sign noted by sonographer. Common bile duct: Diameter: 2 mm. No intrahepatic or extrahepatic biliary duct dilatation. Liver: No focal lesion identified. Liver echogenicity is increased diffusely. Portal vein is patent on color  Doppler imaging with normal direction of blood flow towards the liver. Other: None. IMPRESSION: 1. A 6 mm apparent polyp within the gallbladder. Per consensus guidelines, a polyp of this size does not warrant additional imaging surveillance. There is sludge in the gallbladder. No evident gallstones. No gallbladder wall thickening or pericholecystic fluid. 2. Diffuse increase in liver echogenicity, a finding indicative of hepatic steatosis. No focal liver lesions evident on this study. It must be cautioned that the sensitivity of ultrasound for detection of focal liver lesions is diminished in this circumstance. Electronically Signed   By: WLowella GripIII M.D.   On: 12/18/2020 16:03    ASSESSMENT & PLAN:  40yo female with PMH of B12, folate and iron deficiency and mild anemia since 20's, presented with significant weight loss, jaundice, transaminitis, and rib fracture after a fall.  1.  Macrocytic anemia, with folate deficiency  2.  Transaminitis and nonobstructive hyperbilirubinemia, with significant hepatomegaly 3.  Elevated ferritin, with history of iron deficiency 4.  Significant weight loss 5.  Anxiety, depression, PTSD, borderline personality disorder 6.  Sjogrens syndrome  Recommendations: -Patient has history of nutritional anemia, including folate, B12 and iron deficiency in the past, received IV iron and B12 injections, last follow-up in 2018 -pt is on folate 78m daily, will continue.  Her B12 level was normal at 547 on admission, I will check methylmalonic acid level -Her haptoglobin was negative, reticulocyte count was not elevated, no evidence of hemolysis. -Not sure how to explain her liver failure.  Liver biopsy was done today, will follow-up for results.  I doubt that this is hemochromatosis, given her very low ferritin level in the past. -HMontellois also unlikely, given no other cytopenia, normal spleen on exam and MRI  -Malignancy is on the differential, given her significant  weight loss (not sure how much her psych disorder contributes to her weight loss, she denies intentional weight loss). I will likely recommend bone marrow biopsy, will wait for liver biopsy first to see if she has hematological malignancy in liver, such as lymphoma. -I recommend CT chest and CT pelvis w contrast to rule out malignancy  -blood transfusion if Hg<7.5  -I will f/u tomorrow or Friday   All questions were answered. The patient knows to call the clinic with any problems, questions or concerns.      YTruitt Merle MD 01/14/2021 4:45 PM

## 2021-01-14 NOTE — Progress Notes (Signed)
Family Medicine Teaching Service Daily Progress Note Intern Pager: [phone]  Patient name: Jeanette Elliott Medical record number: [id number] Date of birth: [birth year] Age: 40 y.o. Gender: female  Primary Care Provider: Pcp, No Consultants: GI, Psych, Hematology Code Status: FULL  Pt Overview and Major Events to Date:  5/1: Admitted; MRCP performed 5/4: Liver Bx   Assessment and Plan: Jeanette Smithis a 40 y.o.femalewhopresenteds/p fall with new 9th rib fracture, also jaundiced with elevated LFT's and bilirubinconcerning forCLF of unknown etiology. PMH is significant foranemia, B12 and iron-deficiency, PTSD/Anxiety, HTN, lower extremity swelling, Hepatitis A.  Jaundice, Elevated LFT's and Bilirubin Work up thus far has revealed ANA, mitochondrial antibodies, IgA, IgM, anti-smooth muscle IgG and alpha-1-antitrypsin negative. Liver bx performed yesterday. CMP this morning showed LFT's downtrending AST 247>193, ALT 79>70, Alk Phos 394>400, Total bili 9.5>9. Hematology consult placed yesterday and CT chest/abd/pelvis ordered per recommendations to assess for potential malignancy. -GI following, appreciate recommendations: F/u liver biopsy -Hematology following, appreciate consultation and care:  Potential bone marrow bx pending liver bx   F/u CT chest, Abd/pelv with contrast  -Start 50 mg spironolactone qd -Start 20 mg Lasix qd -Labs ordered/pending:              Hemochromatosis DNA              LKM Ab             Thiamine              IgG        Left 9th Rib Fracture: acute, stable  From fall from standing. Continues to have pain, does not feel that it is controlled.  -Scheduled low-dose acetaminophen, 325 mg q6h  -Scheduled Ketorolac 15 mg q6h  -Schedule Oxycodone 2.5 mg BID  -Lidocaine patch daily   Hyponatremia Na 131>129. Asymptomatic.  -Monitor with daily BMP  Cholelithiasis  Biliary Sludge Multiple small gallstones and biliary sludge appreciated on MRCP without  cholecystitis or choledocholithiasis. -Consider outpatient elective cholecystectomy   Macrocytic Anemia Folate Deficiency Hgb stable 7.7>8.1. Transfusion threshold is <7. Haptoglobin WNL. Homocysteine elevated at 19.3, path smear consistent with macrocytic anemia. -MMA pending  -Started on folic acid supplementation -Monitor CBC -Since Hgb stable, will start subq Heparin for DVT ppx (potential bone marrow bx)  Anxiety, Depression PTSD  Borderline Personality Disorder -Psych following, appreciate care and consultation -Hydroxyzine 50 mg TIDPRN anxiety -Lexapro 10 mg daily  Sjogrens: chronic, stable Naphazoline HCl 1 drop in both eyes BID.  GERD  Takes Omeprazole 20 mg every morning.  -Holding, can add back as needed   Constipation Has not had a bowel movement since 4/30.  Patient reported yesterday that this is normal for her and she has not had much to eat.  Given that pain medication will not be scheduled, will add scheduled senna.  Patient declined MiraLAX yesterday but will also add PRN. -MiraLAX PRN  -Senna   FEN/GI:Regular diet PPx:SCD's   Status is: Inpatient  Remains inpatient appropriate because:Ongoing diagnostic testing needed not appropriate for outpatient work up and Inpatient level of care appropriate due to severity of illness   Dispo: The patient is from: Home              Anticipated d/c is to: Home              Patient currently is not medically stable to d/c.   Difficult to place patient No    Subjective:  Patient continues to have abdominal pain.  His predominantly  left upper quadrant around the area of prior fracture.  She continues to feel distended and bloated.  She did not sleep well last night.  She continues to desire discharge home.  Objective: Temp:  [97.7 F (36.5 C)-98.3 F (36.8 C)] 98.1 F (36.7 C) (05/04 1526) Pulse Rate:  [86-99] 86 (05/04 1526) Resp:  [14-19] 15 (05/04 0927) BP: (81-98)/(52-73) 96/73 (05/04  1526) SpO2:  [95 %-100 %] 98 % (05/04 1526) Physical Exam: General: Awake, alert, in no distress  Cardiovascular: RRR without murmur Respiratory: Without respiratory distress, breathing comfortably  Abdomen: mildly distended, tenderness diffusely to light palpation  Extremities: trace edema b/l ankles, 2+ DP and radial pulses b/l  Laboratory: Recent Labs  Lab 01/12/21 0053 01/13/21 0221 01/14/21 0246  WBC 8.6 7.5 8.5  HGB 8.0* 7.4* 7.7*  HCT 22.7* 22.0* 23.0*  PLT 172 150 154   Recent Labs  Lab 01/12/21 0053 01/13/21 0221 01/14/21 0246  NA 131* 131* 131*  K 3.5 3.0* 4.4  CL 91* 96* 99  CO2 _0 BUN <5* <5* 8  CREATININE 0.80 0.77 0.88  CALCIUM 7.7* 7.5* 8.0*  PROT 5.0* 4.6* 4.7*  BILITOT 9.1* 9.7* 9.5*  ALKPHOS 421* 388* 394*  ALT 84* 79* 79*  AST 259* 274* 247*  GLUCOSE 88 91 95   Imaging/Diagnostic Tests: CT pending   Sharion Settler, DO 01/14/2021, 7:39 PM PGY-1, [place] Intern pager: [phone], text pages welcome

## 2021-01-14 NOTE — Consult Note (Signed)
Chief Complaint: Patient was seen in consultation today for jaundice/random liver biopsy.  Referring Physician(s): Vena Rua (GI)  Supervising Physician: Sandi Mariscal  Patient Status: [facility] - In-pt  History of Present Illness: Jeanette Elliott is a 40 y.o. female with a past medical history of hypertension, Sjogren's syndrome, anemia, B-12 deficiency, Hepatitis A, GERD, and PTSD/anxiety. She presented to [facility] ED 01/11/2021 secondary to fall. She was found to have new 9th left rib fracture and ws incidently found to be jaundice. Further work-up revealed elevated LFTs/bilirubin. She was admitted for further management. GI was consulted who recommended random liver biopsy for further evaluation (possible hemochromatosis).  IR consulted by Azucena Freed, PA-C for possible image-guided random liver biopsy. Patient awake and alert sitting in bed. RN at bedside. Complains of intermittent abdominal pain, rated 8/10 with movement. Denies N/V associated with abdominal pain, does complain of poor appetite. Denies fever, chills, chest pain, dyspnea, or headache.   Past Medical History:  Diagnosis Date  . Asthma   . Elevated liver enzymes   . Jaundice   . Sjogren's syndrome [facility])     Past Surgical History:  Procedure Laterality Date  . SUBMANDIBULAR GLAND EXCISION    . TONSILLECTOMY      Allergies: Latex  Medications: Prior to Admission medications   Medication Sig Start Date End Date Taking? Authorizing Provider  albuterol (VENTOLIN HFA) 108 (90 Base) MCG/ACT inhaler Inhale 2 puffs into the lungs every 6 (six) hours as needed (asthma attacks).   Yes [provider]  EPINEPHrine 0.3 mg/0.3 mL IJ SOAJ injection Inject 0.3 mg into the muscle once as needed for anaphylaxis.   Yes [provider]  furosemide (LASIX) 20 MG tablet Take 20 mg by mouth every morning. 12/29/20  Yes [provider]  ibuprofen (ADVIL) 200 MG tablet Take 200 mg by mouth at bedtime as  needed (pain).   Yes [provider]  Multiple Vitamins-Minerals (ADULT ONE DAILY GUMMIES) CHEW Chew 2 tablets by mouth daily.   Yes [provider]  Naphazoline HCl (CLEAR EYES OP) Place 1 drop into both eyes 2 (two) times daily.   Yes [provider]  omeprazole (PRILOSEC OTC) 20 MG tablet Take 20 mg by mouth every morning.   Yes [provider]  Simethicone (GAS-X PO) Take 1 tablet by mouth daily as needed (bloating).   Yes [provider]  spironolactone (ALDACTONE) 50 MG tablet Take 50 mg by mouth every morning. 12/29/20  Yes [provider]     No family history on file.  Social History   Socioeconomic History  . Marital status: Divorced    Spouse name: Not on file  . Number of children: Not on file  . Years of education: Not on file  . Highest education level: Not on file  Occupational History  . Not on file  Tobacco Use  . Smoking status: Never Smoker  . Smokeless tobacco: Never Used  Substance and Sexual Activity  . Alcohol use: Not Currently  . Drug use: Not Currently  . Sexual activity: Not on file  Other Topics Concern  . Not on file  Social History Narrative  . Not on file   Social Determinants of Health   Financial Resource Strain: Not on file  Food Insecurity: Not on file  Transportation Needs: Not on file  Physical Activity: Not on file  Stress: Not on file  Social Connections: Not on file     Review of Systems: A 12 point  ROS discussed and pertinent positives are indicated in the HPI above.  All other systems are negative.  Review of Systems  Constitutional: Positive for appetite change. Negative for chills and fever.  Respiratory: Negative for shortness of breath and wheezing.   Cardiovascular: Negative for chest pain and palpitations.  Gastrointestinal: Negative for nausea and vomiting.  Neurological: Negative for headaches.  Psychiatric/Behavioral: Negative for behavioral problems and  confusion.    Vital Signs: BP 98/73 (BP Location: Right Arm)   Pulse 99   Temp 98 F (36.7 C)   Resp 18   SpO2 98%   Physical Exam Vitals and nursing note reviewed.  Constitutional:      General: She is not in acute distress. Eyes:     General: Scleral icterus present.  Cardiovascular:     Rate and Rhythm: Normal rate and regular rhythm.     Heart sounds: Normal heart sounds. No murmur heard.   Pulmonary:     Effort: Pulmonary effort is normal. No respiratory distress.     Breath sounds: Normal breath sounds. No wheezing.  Skin:    General: Skin is warm and dry.     Coloration: Skin is jaundiced.  Neurological:     Mental Status: She is alert and oriented to person, place, and time.      MD Evaluation Airway: WNL Heart: WNL Abdomen: WNL Chest/ Lungs: WNL ASA  Classification: 2 Mallampati/Airway Score: Two   Imaging: DG Ribs Unilateral W/Chest Left  Result Date: 01/11/2021 CLINICAL DATA:  Rib pain. EXAM: LEFT RIBS AND CHEST - 3+ VIEW COMPARISON:  None. FINDINGS: There is a nondisplaced fracture through the anterior ninth left rib. No other bony or soft tissue abnormalities. No pneumothorax. IMPRESSION: Nondisplaced fracture through the anterior ninth left rib. No pneumothorax. Electronically Signed   By: Dorise Bullion III M.D   On: 01/11/2021 10:05   MR 3D Recon At Scanner  Result Date: 01/11/2021 CLINICAL DATA:  Jaundice EXAM: MRI ABDOMEN WITHOUT AND WITH CONTRAST (INCLUDING MRCP) TECHNIQUE: Multiplanar multisequence MR imaging of the abdomen was performed both before and after the administration of intravenous contrast. Heavily T2-weighted images of the biliary and pancreatic ducts were obtained, and three-dimensional MRCP images were rendered by post processing. CONTRAST:  73m GADAVIST GADOBUTROL 1 MMOL/ML IV SOLN COMPARISON:  12/18/2020 FINDINGS: Lower chest: Lung bases are clear. Hepatobiliary: The liver is enlarged measuring 25.5 cm in craniocaudal length. There  is mild background heterogeneity of the liver parenchyma on post-contrast images, consistent with intrinsic liver disease or hepatitis. No focal lesions. There is no intrahepatic biliary duct dilation. Common bile duct measures 5 mm in diameter, normal. No filling defects or choledocholithiasis. Multiple small gallstones and sludge are seen layering dependently within the gallbladder, with no evidence of cholecystitis. Pancreas: No mass, inflammatory changes, or other parenchymal abnormality identified. Spleen:  Within normal limits in size and appearance. Adrenals/Urinary Tract: No masses identified. No evidence of hydronephrosis. The adrenals are unremarkable. Stomach/Bowel: No evidence of bowel obstruction. Vascular/Lymphatic: Normal caliber of the abdominal aorta. No pathologic adenopathy. Other: Trace ascites along the liver capsule. No evidence of pneumoperitoneum. No abdominal wall hernia. Musculoskeletal: No suspicious bone lesions identified. IMPRESSION: 1. Hepatomegaly, with heterogeneous background liver parenchyma on postcontrast images suggesting chronic liver disease or hepatitis. No focal liver abnormalities. 2. Cholelithiasis and gallbladder sludge. No evidence of cholecystitis or choledocholithiasis. 3. Normal common bile duct.  No evidence of biliary obstruction. Electronically Signed   By: MRanda NgoM.D.   On: 01/11/2021 20:16  MR ABDOMEN MRCP W WO CONTAST  Result Date: 01/11/2021 CLINICAL DATA:  Jaundice EXAM: MRI ABDOMEN WITHOUT AND WITH CONTRAST (INCLUDING MRCP) TECHNIQUE: Multiplanar multisequence MR imaging of the abdomen was performed both before and after the administration of intravenous contrast. Heavily T2-weighted images of the biliary and pancreatic ducts were obtained, and three-dimensional MRCP images were rendered by post processing. CONTRAST:  55m GADAVIST GADOBUTROL 1 MMOL/ML IV SOLN COMPARISON:  12/18/2020 FINDINGS: Lower chest: Lung bases are clear. Hepatobiliary: The  liver is enlarged measuring 25.5 cm in craniocaudal length. There is mild background heterogeneity of the liver parenchyma on post-contrast images, consistent with intrinsic liver disease or hepatitis. No focal lesions. There is no intrahepatic biliary duct dilation. Common bile duct measures 5 mm in diameter, normal. No filling defects or choledocholithiasis. Multiple small gallstones and sludge are seen layering dependently within the gallbladder, with no evidence of cholecystitis. Pancreas: No mass, inflammatory changes, or other parenchymal abnormality identified. Spleen:  Within normal limits in size and appearance. Adrenals/Urinary Tract: No masses identified. No evidence of hydronephrosis. The adrenals are unremarkable. Stomach/Bowel: No evidence of bowel obstruction. Vascular/Lymphatic: Normal caliber of the abdominal aorta. No pathologic adenopathy. Other: Trace ascites along the liver capsule. No evidence of pneumoperitoneum. No abdominal wall hernia. Musculoskeletal: No suspicious bone lesions identified. IMPRESSION: 1. Hepatomegaly, with heterogeneous background liver parenchyma on postcontrast images suggesting chronic liver disease or hepatitis. No focal liver abnormalities. 2. Cholelithiasis and gallbladder sludge. No evidence of cholecystitis or choledocholithiasis. 3. Normal common bile duct.  No evidence of biliary obstruction. Electronically Signed   By: MRanda NgoM.D.   On: 01/11/2021 20:16   UKoreaAbdomen Limited RUQ (LIVER/GB)  Result Date: 12/18/2020 CLINICAL DATA:  Upper abdominal pain EXAM: ULTRASOUND ABDOMEN LIMITED RIGHT UPPER QUADRANT COMPARISON:  None. FINDINGS: Gallbladder: There is sludge in the gallbladder. There is a 6 mm echogenic focus in the gallbladder which neither moves nor shadows, a presumed polyp. There are no echogenic foci in the gallbladder which move and shadow as is expected with cholelithiasis. There is no gallbladder wall thickening or pericholecystic fluid. No  sonographic Murphy sign noted by sonographer. Common bile duct: Diameter: 2 mm. No intrahepatic or extrahepatic biliary duct dilatation. Liver: No focal lesion identified. Liver echogenicity is increased diffusely. Portal vein is patent on color Doppler imaging with normal direction of blood flow towards the liver. Other: None. IMPRESSION: 1. A 6 mm apparent polyp within the gallbladder. Per consensus guidelines, a polyp of this size does not warrant additional imaging surveillance. There is sludge in the gallbladder. No evident gallstones. No gallbladder wall thickening or pericholecystic fluid. 2. Diffuse increase in liver echogenicity, a finding indicative of hepatic steatosis. No focal liver lesions evident on this study. It must be cautioned that the sensitivity of ultrasound for detection of focal liver lesions is diminished in this circumstance. Electronically Signed   By: WLowella GripIII M.D.   On: 12/18/2020 16:03    Labs:  CBC: Recent Labs    01/11/21 1029 01/12/21 0053 01/13/21 0221 01/14/21 0246  WBC 10.3 8.6 7.5 8.5  HGB 9.8* 8.0* 7.4* 7.7*  HCT 27.7* 22.7* 22.0* 23.0*  PLT 205 172 150 154    COAGS: Recent Labs    12/18/20 1456 01/11/21 1437  INR 1.1 1.3*  APTT  --  33    BMP: Recent Labs    01/11/21 1029 01/12/21 0053 01/13/21 0221 01/14/21 0246  NA 130* 131* 131* 131*  K 3.2* 3.5 3.0* 4.4  CL  84* 91* 96* 99  CO2 27 26 25 23   GLUCOSE 89 88 91 95  BUN <5* <5* <5* 8  CALCIUM 8.3* 7.7* 7.5* 8.0*  CREATININE 0.70 0.80 0.77 0.88  GFRNONAA >60 >60 >60 >60    LIVER FUNCTION TESTS: Recent Labs    01/11/21 1029 01/12/21 0053 01/13/21 0221 01/14/21 0246  BILITOT 9.5* 9.1* 9.7* 9.5*  AST 295* 259* 274* 247*  ALT 99* 84* 79* 79*  ALKPHOS 540* 421* 388* 394*  PROT 5.9* 5.0* 4.6* 4.7*  ALBUMIN 2.4* 2.0* 1.8* 1.9*     Assessment and Plan:  Jaundice/scleral icterus, elevated LFTs/bilirubin. Plan for image-guided random liver biopsy today in  IR. Patient is NPO. Afebrile. She does not take blood thinners. INR 1.3 01/11/2021.  Risks and benefits discussed with the patient including, but not limited to bleeding, infection, damage to adjacent structures or low yield requiring additional tests. All of the patient's questions were answered, patient is agreeable to proceed. Consent signed and in chart.   Thank you for this interesting consult.  I greatly enjoyed meeting Voncile Schwarz and look forward to participating in their care.  A copy of this report was sent to the requesting provider on this date.  Electronically Signed: Earley Abide, PA-C 01/14/2021, 8:23 AM   I spent a total of 20 Minutes in face to face in clinical consultation, greater than 50% of which was counseling/coordinating care for jaundice/random liver biopsy.

## 2021-01-15 ENCOUNTER — Inpatient Hospital Stay (HOSPITAL_COMMUNITY): Payer: 59

## 2021-01-15 ENCOUNTER — Encounter (HOSPITAL_COMMUNITY): Payer: Self-pay | Admitting: Family Medicine

## 2021-01-15 DIAGNOSIS — R17 Unspecified jaundice: Secondary | ICD-10-CM | POA: Diagnosis not present

## 2021-01-15 DIAGNOSIS — R748 Abnormal levels of other serum enzymes: Secondary | ICD-10-CM | POA: Diagnosis not present

## 2021-01-15 DIAGNOSIS — S2232XD Fracture of one rib, left side, subsequent encounter for fracture with routine healing: Secondary | ICD-10-CM | POA: Diagnosis not present

## 2021-01-15 DIAGNOSIS — R634 Abnormal weight loss: Secondary | ICD-10-CM

## 2021-01-15 DIAGNOSIS — E538 Deficiency of other specified B group vitamins: Secondary | ICD-10-CM | POA: Diagnosis not present

## 2021-01-15 LAB — CBC
HCT: 23.7 % — ABNORMAL LOW (ref 36.0–46.0)
Hemoglobin: 8.1 g/dL — ABNORMAL LOW (ref 12.0–15.0)
MCH: 40.1 pg — ABNORMAL HIGH (ref 26.0–34.0)
MCHC: 34.2 g/dL (ref 30.0–36.0)
MCV: 117.3 fL — ABNORMAL HIGH (ref 80.0–100.0)
Platelets: 153 10*3/uL (ref 150–400)
RBC: 2.02 MIL/uL — ABNORMAL LOW (ref 3.87–5.11)
RDW: 17.3 % — ABNORMAL HIGH (ref 11.5–15.5)
WBC: 10.2 10*3/uL (ref 4.0–10.5)
nRBC: 0 % (ref 0.0–0.2)

## 2021-01-15 LAB — COMPREHENSIVE METABOLIC PANEL
ALT: 70 U/L — ABNORMAL HIGH (ref 0–44)
AST: 193 U/L — ABNORMAL HIGH (ref 15–41)
Albumin: 1.8 g/dL — ABNORMAL LOW (ref 3.5–5.0)
Alkaline Phosphatase: 400 U/L — ABNORMAL HIGH (ref 38–126)
Anion gap: 10 (ref 5–15)
BUN: 11 mg/dL (ref 6–20)
CO2: 19 mmol/L — ABNORMAL LOW (ref 22–32)
Calcium: 7.9 mg/dL — ABNORMAL LOW (ref 8.9–10.3)
Chloride: 100 mmol/L (ref 98–111)
Creatinine, Ser: 0.86 mg/dL (ref 0.44–1.00)
GFR, Estimated: >60 mL/min (ref >60)
Glucose, Bld: 78 mg/dL (ref 70–99)
Potassium: 4.1 mmol/L (ref 3.5–5.1)
Sodium: 129 mmol/L — ABNORMAL LOW (ref 135–145)
Total Bilirubin: 9 mg/dL — ABNORMAL HIGH (ref 0.3–1.2)
Total Protein: 4.8 g/dL — ABNORMAL LOW (ref 6.5–8.1)

## 2021-01-15 LAB — ANTI-MICROSOMAL ANTIBODY LIVER / KIDNEY: LKM1 Ab: 1.6 Units (ref 0.0–20.0)

## 2021-01-15 MED ORDER — LIDOCAINE 5 % EX PTCH
1.0000 | MEDICATED_PATCH | CUTANEOUS | Status: DC
  Administered 2021-01-15 – 2021-01-16 (×2): 1 via TRANSDERMAL
  Filled 2021-01-15 (×2): qty 1

## 2021-01-15 MED ORDER — IOHEXOL 9 MG/ML PO SOLN
ORAL | Status: AC
  Filled 2021-01-15: qty 1000

## 2021-01-15 MED ORDER — KETOROLAC TROMETHAMINE 15 MG/ML IJ SOLN
15.0000 mg | Freq: Four times a day (QID) | INTRAMUSCULAR | Status: DC
  Administered 2021-01-15 – 2021-01-17 (×8): 15 mg via INTRAVENOUS
  Filled 2021-01-15 (×8): qty 1

## 2021-01-15 MED ORDER — SPIRONOLACTONE 25 MG PO TABS
50.0000 mg | ORAL_TABLET | Freq: Every day | ORAL | Status: DC
  Administered 2021-01-15 – 2021-01-17 (×3): 50 mg via ORAL
  Filled 2021-01-15 (×3): qty 2

## 2021-01-15 MED ORDER — FUROSEMIDE 10 MG/ML IJ SOLN
20.0000 mg | Freq: Every day | INTRAMUSCULAR | Status: DC
  Administered 2021-01-15 – 2021-01-17 (×2): 20 mg via INTRAVENOUS
  Filled 2021-01-15 (×3): qty 2

## 2021-01-15 MED ORDER — HEPARIN SODIUM (PORCINE) 5000 UNIT/ML IJ SOLN
5000.0000 [IU] | Freq: Three times a day (TID) | INTRAMUSCULAR | Status: DC
  Administered 2021-01-15: 5000 [IU] via SUBCUTANEOUS
  Filled 2021-01-15: qty 1

## 2021-01-15 MED ORDER — POLYETHYLENE GLYCOL 3350 17 G PO PACK: 17.0000 g | PACK | Freq: Every day | ORAL | Status: DC | PRN

## 2021-01-15 MED ORDER — IOHEXOL 300 MG/ML  SOLN
75.0000 mL | Freq: Once | INTRAMUSCULAR | Status: AC | PRN
  Administered 2021-01-15: 75 mL via INTRAVENOUS

## 2021-01-15 MED ORDER — OXYCODONE HCL 5 MG PO TABS
2.5000 mg | ORAL_TABLET | Freq: Two times a day (BID) | ORAL | Status: DC
Start: 2021-01-15 — End: 2021-01-17
  Administered 2021-01-15 – 2021-01-17 (×5): 2.5 mg via ORAL
  Filled 2021-01-15 (×5): qty 1

## 2021-01-15 MED ORDER — ONDANSETRON HCL 4 MG/2ML IJ SOLN
4.0000 mg | Freq: Three times a day (TID) | INTRAMUSCULAR | Status: DC | PRN
  Administered 2021-01-15 – 2021-01-16 (×3): 4 mg via INTRAVENOUS
  Filled 2021-01-15 (×3): qty 2

## 2021-01-15 MED ORDER — HEPARIN SODIUM (PORCINE) 5000 UNIT/ML IJ SOLN: 5000.0000 [IU] | Freq: Three times a day (TID) | INTRAMUSCULAR | Status: DC

## 2021-01-15 MED ORDER — ALBUMIN HUMAN 25 % IV SOLN
12.5000 g | Freq: Once | INTRAVENOUS | Status: DC
  Filled 2021-01-15: qty 50

## 2021-01-15 MED ORDER — SENNA 8.6 MG PO TABS
1.0000 | ORAL_TABLET | Freq: Every day | ORAL | Status: DC
  Administered 2021-01-15 – 2021-01-17 (×3): 8.6 mg via ORAL
  Filled 2021-01-15 (×3): qty 1

## 2021-01-15 NOTE — Progress Notes (Signed)
[facility] ROUNDING NOTE   Subjective: Liver biopsy completed yesterday.  Plan is for specimen to be sent to [facility] for further review.  CT C/A/P earlier today with bilateral moderate to large pleural effusions with lower lobe consolidation, hepatic steatosis, hepatomegaly, and moderate volume ascites.  The ascites and effusions are both new from recent MRI and abdominal ultrasound.  Does have some mild pain at the liver biopsy site, but otherwise does not offer any complaints.  Is hopeful for discharge home soon.   Objective: Vital signs in last 24 hours: Temp:  [98.1 F (36.7 C)-98.4 F (36.9 C)] 98.1 F (36.7 C) (05/05 1417) Pulse Rate:  [83-92] 86 (05/05 1417) Resp:  [17-18] 18 (05/05 1417) BP: (85-93)/(64-71) 90/71 (05/05 1417) SpO2:  [98 %-100 %] 100 % (05/05 1417) Last BM Date: 01/10/21 General: NAD HEENT: Icteric sclera, jaundice complexion Ext:  No c/c/e    Intake/Output from previous day: 05/04 0701 - 05/05 0700 In: 978.5 [P.O.:280; I.V.:698.5] Out: -  Intake/Output this shift: No intake/output data recorded.   Lab Results: Recent Labs    01/13/21 0221 01/14/21 0246 01/15/21 0256  WBC 7.5 8.5 10.2  HGB 7.4* 7.7* 8.1*  PLT 150 154 153  MCV 116.4* 117.3* 117.3*   BMET Recent Labs    01/13/21 0221 01/14/21 0246 01/15/21 0256  NA 131* 131* 129*  K 3.0* 4.4 4.1  CL 96* 99 100  CO2 25 23 19*  GLUCOSE 91 95 78  BUN <5* 8 11  CREATININE 0.77 0.88 0.86  CALCIUM 7.5* 8.0* 7.9*   LFT Recent Labs    01/13/21 0221 01/14/21 0246 01/15/21 0256  PROT 4.6* 4.7* 4.8*  ALBUMIN 1.8* 1.9* 1.8*  AST 274* 247* 193*  ALT 79* 79* 70*  ALKPHOS 388* 394* 400*  BILITOT 9.7* 9.5* 9.0*   PT/INR No results for input(s): INR in the last 72 hours.    Imaging/Other results: CT CHEST W CONTRAST  Result Date: 01/15/2021 CLINICAL DATA:  Unintentional weight loss and fatigue EXAM: CT CHEST WITH CONTRAST TECHNIQUE: Multidetector CT imaging of  the chest was performed during intravenous contrast administration. CONTRAST:  32m OMNIPAQUE IOHEXOL 300 MG/ML  SOLN COMPARISON:  10/23/2019 FINDINGS: Cardiovascular: Thoracic aorta and its branches are well visualized without aneurysmal dilatation or dissection. No cardiac enlargement is noted. No coronary calcifications are seen. Pulmonary artery as visualized is within normal limits although not timed for embolus evaluation. Mediastinum/Nodes: Thoracic inlet demonstrates tiny hypodensities within the thyroid stable in appearance from the prior exam. These all measure less than 1 cm. No hilar or mediastinal adenopathy is noted. The esophagus as visualized is within normal limits. Lungs/Pleura: Bilateral moderate to large pleural effusions are noted with associated lower lobe consolidation. No focal mass lesion is seen. Upper Abdomen: Fatty infiltration of the liver is seen. Mild amount of ascites is noted. The remainder of the upper abdomen is within normal limits. Musculoskeletal: No chest wall abnormality. No acute or significant osseous findings. IMPRESSION: Bilateral moderate to large pleural effusions with associated lower lobe consolidation. Fatty liver and mild ascites. Tiny bilateral thyroid nodules stable from prior CT. No followup recommended (ref: J Am Coll Radiol. 2015 Feb;12(2): 143-50). No other acute abnormality is noted. Electronically Signed   By: MInez CatalinaM.D.   On: 01/15/2021 15:08   CT ABDOMEN PELVIS W CONTRAST  Result Date: 01/15/2021 CLINICAL DATA:  Painless jaundice, weight loss, fatigue EXAM: CT ABDOMEN AND PELVIS WITH CONTRAST TECHNIQUE: Multidetector CT imaging of the abdomen and  pelvis was performed using the standard protocol following bolus administration of intravenous contrast. CONTRAST:  72m OMNIPAQUE IOHEXOL 300 MG/ML  SOLN COMPARISON:  MR abdomen, 01/11/2021 FINDINGS: Lower chest: Moderate bilateral pleural effusions and associated atelectasis or consolidation,  substantially increased compared to prior MR. Hepatobiliary: Hepatomegaly and severe hepatic steatosis. Mildly hyperdense biopsy site of the tip of the right lobe of the liver (series 3, image 51). Mildly distended, thickened gallbladder containing dependent sludge. No discrete calculi or biliary ductal dilatation. Pancreas: Unremarkable. No pancreatic ductal dilatation or surrounding inflammatory changes. Spleen: Normal in size without significant abnormality. Adrenals/Urinary Tract: Adrenal glands are unremarkable. Kidneys are normal, without renal calculi, solid lesion, or hydronephrosis. Bladder is unremarkable. Stomach/Bowel: Stomach is within normal limits. Appendix appears normal. No evidence of bowel wall thickening, distention, or inflammatory changes. Vascular/Lymphatic: No significant vascular findings are present. No enlarged abdominal or pelvic lymph nodes. Reproductive: No mass or other significant abnormality. Other: No abdominal wall hernia or abnormality. Moderate volume ascites throughout the abdomen and pelvis, new compared to prior examination. Musculoskeletal: No acute or significant osseous findings. IMPRESSION: 1. Hepatomegaly and severe hepatic steatosis. 2. Moderate volume ascites throughout the abdomen and pelvis, new compared to prior examination. 3. Moderate bilateral pleural effusions and associated atelectasis or consolidation, substantially increased compared to prior examination. 4. Mildly distended, thickened gallbladder containing dependent sludge. No discrete calculi or biliary ductal dilatation. This appearance is generally nonspecific in the setting of ascites. Electronically Signed   By: AEddie CandleM.D.   On: 01/15/2021 09:56   [place] (LIVER)  Result Date: 01/14/2021 INDICATION: Elevated LFTs. Please perform ultrasound-guided liver biopsy for tissue diagnostic purposes. EXAM: ULTRASOUND GUIDED LIVER BIOPSY COMPARISON:  None. MEDICATIONS: None ANESTHESIA/SEDATION:  Fentanyl 25 mcg IV; Versed 0.5 mg IV Total Moderate Sedation time: 12 minutes; The patient was continuously monitored during the procedure by the interventional radiology nurse under my direct supervision. COMPLICATIONS: None immediate. PROCEDURE: Informed written consent was obtained from the patient after a discussion of the risks, benefits and alternatives to treatment. The patient understands and consents the procedure. A timeout was performed prior to the initiation of the procedure. Ultrasound scanning was performed of the right upper abdominal quadrant and the procedure was planned. The right upper abdomen was prepped and draped in the usual sterile fashion. The overlying soft tissues were anesthetized with 1% lidocaine with epinephrine. A 17 gauge, 6.8 cm co-axial needle was advanced into a peripheral aspect of the right lobe of the liver and 3 core biopsies were obtained with an 18 gauge core device under direct ultrasound guidance. The co-axial needle track was embolized with the administration of a Gel-Foam slurry. Superficial hemostasis was obtained with manual compression. Post procedural scanning was negative for definitive area of hemorrhage. A dressing was placed. The patient tolerated the procedure well without immediate post procedural complication. IMPRESSION: Technically successful ultrasound guided liver biopsy. Electronically Signed   By: JSandi MariscalM.D.   On: 01/14/2021 09:37      Assessment and Plan:  40year old femalewithprogressive elevation in liver enzymes and bilirubin over the last several months with associated weight loss, jaundice, icteric sclera.Now undergoing extensive evaluation which so far is notable for the following:  -Ferritin 1071, iron 117, TIBC 126, sat 93% -Normal/negative IgG, IgA, IgM, ceruloplasmin, AMA, ANA, LKM Ab, ASMA -Elevated LDH at 296; normal haptoglobin -Liver enzymes largely stable with AST/ALT/ALP 193/70/400 and T bili 9.0 -Folate  3.5 -H/H  8.1/23.7, PLT 153.  MCV 117 -HIV negative, acute viral  hepatitis panel negative -INR 1.3 -Lactate 7.7-->5.1-->2.2 --> 3.2 - Pending labs include: HFE, thiamine - Liver biopsy completed 01/14/2021: Specimen being sent to [facility]  - Abdominal US (12/18/2020): GB sludge, 6 mm GB polyp, hepatic steatosis -MRCP (01/11/2021): Enlarged liver 25 cm, CBD 5 mm without any intrahepatic duct dilation. No CDL. GB sludge with small stones without cholecystitis. Normal pancreas. Heterogenous appearing liver - CT chest (01/15/2021): Moderate to large bilateral pleural effusions with associated lower lobe consolidation, new from prior.  No mass. CT abdomen/pelvis (01/15/2021): Hepatomegaly, severe hepatic steatosis, moderate volume ascites, new from prior.  1) Iron overload 2) Hyperbilirubinemia 3) Elevated alkaline phosphatase 4 Jaundiced/Icteric sclera 5) Lactic acidosis-improved 6) Hypoalbuminemia 7) Macrocytic anemia/folate deficiency 8) Elevated liver enzymes 9) Mild coagulopathy 10) Hepatomegaly on MRI 11) Gallbladder sludge/small stones 12) Weight loss/Decreased appetite 13) Ascites 14) Pleural effusions  -  Was seen by Hematology/Oncology and I discussed her case with Dr. Burr Medico today.  Based on her history of iron deficiency in the past, she does not think this truly represents Hemochromatosis.  She additionally discussed the liver biopsy with the Pathologist.  While this is being sent off to [facility], does not appear to have any malignancy on the available specimen.  Similarly, no evidence of hemolysis - Aside from the iron overload, extended serologic work-up largely unrevealing for underlying liver disease.  Biopsy pending - Additionally confounding her picture is the new ascites and bilateral pleural effusions.  Neither of these were present on prior imaging.  Plan for diagnostic paracentesis and probable thoracentesis.  Diuretics restarted by primary service - Will follow  up on liver biopsy results when this comes back from [place] encouraging p.o. intake - Following paracentesis/thoracentesis, could be reasonable to anticipate discharge in the next 24-48 hours - GI service will continue to follow     Lavena Bullion, DO  01/15/2021, 6:15 PM [facility] Pager [phone]

## 2021-01-15 NOTE — Progress Notes (Addendum)
HEMATOLOGY-ONCOLOGY PROGRESS NOTE  SUBJECTIVE: Reports ongoing abdominal distention which is somewhat better today.  It was worse this morning prior to starting diuretics.  No bleeding reported.  REVIEW OF SYSTEMS:   Constitutional: Denies fevers, chills  Eyes: Denies blurriness of vision Ears, nose, mouth, throat, and face: Denies mucositis or sore throat Respiratory: Denies cough, dyspnea or wheezes Cardiovascular: Denies palpitation, chest discomfort Gastrointestinal:  Denies nausea, heartburn  Skin: Denies abnormal skin rashes Lymphatics: Denies new lymphadenopathy or easy bruising Neurological:Denies numbness, tingling or new weaknesses Behavioral/Psych: Mood is stable, no new changes  Extremities: No lower extremity edema All other systems were reviewed with the patient and are negative.  I have reviewed the past medical history, past surgical history, social history and family history with the patient and they are unchanged from previous note.   PHYSICAL EXAMINATION:  Vitals:   01/15/21 1011 01/15/21 1417  BP: 91/68 90/71  Pulse: 86 86  Resp:  18  Temp:  98.1 F (36.7 C)  SpO2:  100%   There were no vitals filed for this visit.  Intake/Output from previous day: 05/04 0701 - 05/05 0700 In: 978.5 [P.O.:280; I.V.:698.5] Out: -   GENERAL:alert, no distress and comfortable SKIN: skin color, texture, turgor are normal, no rashes or significant lesions EYES: normal, Conjunctiva are pink and non-injected, sclera clear ABDOMEN: Mildly distended, nontender Musculoskeletal:no cyanosis of digits and no clubbing  NEURO: alert & oriented x 3 with fluent speech, no focal motor/sensory deficits  LABORATORY DATA:  I have reviewed the data as listed CMP Latest Ref Rng & Units 01/15/2021 01/14/2021 01/13/2021  Glucose 70 - 99 mg/dL 78 95 91  BUN 6 - 20 mg/dL 11 8 <5(L)  Creatinine 0.44 - 1.00 mg/dL 0.86 0.88 0.77  Sodium 135 - 145 mmol/L 129(L) 131(L) 131(L)  Potassium 3.5 - 5.1  mmol/L 4.1 4.4 3.0(L)  Chloride 98 - 111 mmol/L 100 99 96(L)  CO2 22 - 32 mmol/L 19(L) 23 25  Calcium 8.9 - 10.3 mg/dL 7.9(L) 8.0(L) 7.5(L)  Total Protein 6.5 - 8.1 g/dL 4.8(L) 4.7(L) 4.6(L)  Total Bilirubin 0.3 - 1.2 mg/dL 9.0(H) 9.5(H) 9.7(H)  Alkaline Phos 38 - 126 U/L 400(H) 394(H) 388(H)  AST 15 - 41 U/L 193(H) 247(H) 274(H)  ALT 0 - 44 U/L 70(H) 79(H) 79(H)    Lab Results  Component Value Date   WBC 10.2 01/15/2021   HGB 8.1 (L) 01/15/2021   HCT 23.7 (L) 01/15/2021   MCV 117.3 (H) 01/15/2021   PLT 153 01/15/2021   NEUTROABS 3.3 12/18/2020    DG Ribs Unilateral W/Chest Left  Result Date: 01/11/2021 CLINICAL DATA:  Rib pain. EXAM: LEFT RIBS AND CHEST - 3+ VIEW COMPARISON:  None. FINDINGS: There is a nondisplaced fracture through the anterior ninth left rib. No other bony or soft tissue abnormalities. No pneumothorax. IMPRESSION: Nondisplaced fracture through the anterior ninth left rib. No pneumothorax. Electronically Signed   By: Dorise Bullion III M.D   On: 01/11/2021 10:05   CT CHEST W CONTRAST  Result Date: 01/15/2021 CLINICAL DATA:  Unintentional weight loss and fatigue EXAM: CT CHEST WITH CONTRAST TECHNIQUE: Multidetector CT imaging of the chest was performed during intravenous contrast administration. CONTRAST:  69m OMNIPAQUE IOHEXOL 300 MG/ML  SOLN COMPARISON:  10/23/2019 FINDINGS: Cardiovascular: Thoracic aorta and its branches are well visualized without aneurysmal dilatation or dissection. No cardiac enlargement is noted. No coronary calcifications are seen. Pulmonary artery as visualized is within normal limits although not timed for embolus evaluation. Mediastinum/Nodes:  Thoracic inlet demonstrates tiny hypodensities within the thyroid stable in appearance from the prior exam. These all measure less than 1 cm. No hilar or mediastinal adenopathy is noted. The esophagus as visualized is within normal limits. Lungs/Pleura: Bilateral moderate to large pleural effusions are  noted with associated lower lobe consolidation. No focal mass lesion is seen. Upper Abdomen: Fatty infiltration of the liver is seen. Mild amount of ascites is noted. The remainder of the upper abdomen is within normal limits. Musculoskeletal: No chest wall abnormality. No acute or significant osseous findings. IMPRESSION: Bilateral moderate to large pleural effusions with associated lower lobe consolidation. Fatty liver and mild ascites. Tiny bilateral thyroid nodules stable from prior CT. No followup recommended (ref: J Am Coll Radiol. 2015 Feb;12(2): 143-50). No other acute abnormality is noted. Electronically Signed   By: Inez Catalina M.D.   On: 01/15/2021 15:08   CT ABDOMEN PELVIS W CONTRAST  Result Date: 01/15/2021 CLINICAL DATA:  Painless jaundice, weight loss, fatigue EXAM: CT ABDOMEN AND PELVIS WITH CONTRAST TECHNIQUE: Multidetector CT imaging of the abdomen and pelvis was performed using the standard protocol following bolus administration of intravenous contrast. CONTRAST:  39m OMNIPAQUE IOHEXOL 300 MG/ML  SOLN COMPARISON:  MR abdomen, 01/11/2021 FINDINGS: Lower chest: Moderate bilateral pleural effusions and associated atelectasis or consolidation, substantially increased compared to prior MR. Hepatobiliary: Hepatomegaly and severe hepatic steatosis. Mildly hyperdense biopsy site of the tip of the right lobe of the liver (series 3, image 51). Mildly distended, thickened gallbladder containing dependent sludge. No discrete calculi or biliary ductal dilatation. Pancreas: Unremarkable. No pancreatic ductal dilatation or surrounding inflammatory changes. Spleen: Normal in size without significant abnormality. Adrenals/Urinary Tract: Adrenal glands are unremarkable. Kidneys are normal, without renal calculi, solid lesion, or hydronephrosis. Bladder is unremarkable. Stomach/Bowel: Stomach is within normal limits. Appendix appears normal. No evidence of bowel wall thickening, distention, or inflammatory  changes. Vascular/Lymphatic: No significant vascular findings are present. No enlarged abdominal or pelvic lymph nodes. Reproductive: No mass or other significant abnormality. Other: No abdominal wall hernia or abnormality. Moderate volume ascites throughout the abdomen and pelvis, new compared to prior examination. Musculoskeletal: No acute or significant osseous findings. IMPRESSION: 1. Hepatomegaly and severe hepatic steatosis. 2. Moderate volume ascites throughout the abdomen and pelvis, new compared to prior examination. 3. Moderate bilateral pleural effusions and associated atelectasis or consolidation, substantially increased compared to prior examination. 4. Mildly distended, thickened gallbladder containing dependent sludge. No discrete calculi or biliary ductal dilatation. This appearance is generally nonspecific in the setting of ascites. Electronically Signed   By: AEddie CandleM.D.   On: 01/15/2021 09:56   MR 3D Recon At Scanner  Result Date: 01/11/2021 CLINICAL DATA:  Jaundice EXAM: MRI ABDOMEN WITHOUT AND WITH CONTRAST (INCLUDING MRCP) TECHNIQUE: Multiplanar multisequence MR imaging of the abdomen was performed both before and after the administration of intravenous contrast. Heavily T2-weighted images of the biliary and pancreatic ducts were obtained, and three-dimensional MRCP images were rendered by post processing. CONTRAST:  568mGADAVIST GADOBUTROL 1 MMOL/ML IV SOLN COMPARISON:  12/18/2020 FINDINGS: Lower chest: Lung bases are clear. Hepatobiliary: The liver is enlarged measuring 25.5 cm in craniocaudal length. There is mild background heterogeneity of the liver parenchyma on post-contrast images, consistent with intrinsic liver disease or hepatitis. No focal lesions. There is no intrahepatic biliary duct dilation. Common bile duct measures 5 mm in diameter, normal. No filling defects or choledocholithiasis. Multiple small gallstones and sludge are seen layering dependently within the  gallbladder, with no evidence of  cholecystitis. Pancreas: No mass, inflammatory changes, or other parenchymal abnormality identified. Spleen:  Within normal limits in size and appearance. Adrenals/Urinary Tract: No masses identified. No evidence of hydronephrosis. The adrenals are unremarkable. Stomach/Bowel: No evidence of bowel obstruction. Vascular/Lymphatic: Normal caliber of the abdominal aorta. No pathologic adenopathy. Other: Trace ascites along the liver capsule. No evidence of pneumoperitoneum. No abdominal wall hernia. Musculoskeletal: No suspicious bone lesions identified. IMPRESSION: 1. Hepatomegaly, with heterogeneous background liver parenchyma on postcontrast images suggesting chronic liver disease or hepatitis. No focal liver abnormalities. 2. Cholelithiasis and gallbladder sludge. No evidence of cholecystitis or choledocholithiasis. 3. Normal common bile duct.  No evidence of biliary obstruction. Electronically Signed   By: Randa Ngo M.D.   On: 01/11/2021 20:16   US BIOPSY (LIVER)  Result Date: 01/14/2021 INDICATION: Elevated LFTs. Please perform ultrasound-guided liver biopsy for tissue diagnostic purposes. EXAM: ULTRASOUND GUIDED LIVER BIOPSY COMPARISON:  None. MEDICATIONS: None ANESTHESIA/SEDATION: Fentanyl 25 mcg IV; Versed 0.5 mg IV Total Moderate Sedation time: 12 minutes; The patient was continuously monitored during the procedure by the interventional radiology nurse under my direct supervision. COMPLICATIONS: None immediate. PROCEDURE: Informed written consent was obtained from the patient after a discussion of the risks, benefits and alternatives to treatment. The patient understands and consents the procedure. A timeout was performed prior to the initiation of the procedure. Ultrasound scanning was performed of the right upper abdominal quadrant and the procedure was planned. The right upper abdomen was prepped and draped in the usual sterile fashion. The overlying soft tissues  were anesthetized with 1% lidocaine with epinephrine. A 17 gauge, 6.8 cm co-axial needle was advanced into a peripheral aspect of the right lobe of the liver and 3 core biopsies were obtained with an 18 gauge core device under direct ultrasound guidance. The co-axial needle track was embolized with the administration of a Gel-Foam slurry. Superficial hemostasis was obtained with manual compression. Post procedural scanning was negative for definitive area of hemorrhage. A dressing was placed. The patient tolerated the procedure well without immediate post procedural complication. IMPRESSION: Technically successful ultrasound guided liver biopsy. Electronically Signed   By: Sandi Mariscal M.D.   On: 01/14/2021 09:37   MR ABDOMEN MRCP W WO CONTAST  Result Date: 01/11/2021 CLINICAL DATA:  Jaundice EXAM: MRI ABDOMEN WITHOUT AND WITH CONTRAST (INCLUDING MRCP) TECHNIQUE: Multiplanar multisequence MR imaging of the abdomen was performed both before and after the administration of intravenous contrast. Heavily T2-weighted images of the biliary and pancreatic ducts were obtained, and three-dimensional MRCP images were rendered by post processing. CONTRAST:  54m GADAVIST GADOBUTROL 1 MMOL/ML IV SOLN COMPARISON:  12/18/2020 FINDINGS: Lower chest: Lung bases are clear. Hepatobiliary: The liver is enlarged measuring 25.5 cm in craniocaudal length. There is mild background heterogeneity of the liver parenchyma on post-contrast images, consistent with intrinsic liver disease or hepatitis. No focal lesions. There is no intrahepatic biliary duct dilation. Common bile duct measures 5 mm in diameter, normal. No filling defects or choledocholithiasis. Multiple small gallstones and sludge are seen layering dependently within the gallbladder, with no evidence of cholecystitis. Pancreas: No mass, inflammatory changes, or other parenchymal abnormality identified. Spleen:  Within normal limits in size and appearance. Adrenals/Urinary Tract:  No masses identified. No evidence of hydronephrosis. The adrenals are unremarkable. Stomach/Bowel: No evidence of bowel obstruction. Vascular/Lymphatic: Normal caliber of the abdominal aorta. No pathologic adenopathy. Other: Trace ascites along the liver capsule. No evidence of pneumoperitoneum. No abdominal wall hernia. Musculoskeletal: No suspicious bone lesions identified. IMPRESSION: 1. Hepatomegaly,  with heterogeneous background liver parenchyma on postcontrast images suggesting chronic liver disease or hepatitis. No focal liver abnormalities. 2. Cholelithiasis and gallbladder sludge. No evidence of cholecystitis or choledocholithiasis. 3. Normal common bile duct.  No evidence of biliary obstruction. Electronically Signed   By: Randa Ngo M.D.   On: 01/11/2021 20:16   US Abdomen Limited RUQ (LIVER/GB)  Result Date: 12/18/2020 CLINICAL DATA:  Upper abdominal pain EXAM: ULTRASOUND ABDOMEN LIMITED RIGHT UPPER QUADRANT COMPARISON:  None. FINDINGS: Gallbladder: There is sludge in the gallbladder. There is a 6 mm echogenic focus in the gallbladder which neither moves nor shadows, a presumed polyp. There are no echogenic foci in the gallbladder which move and shadow as is expected with cholelithiasis. There is no gallbladder wall thickening or pericholecystic fluid. No sonographic Murphy sign noted by sonographer. Common bile duct: Diameter: 2 mm. No intrahepatic or extrahepatic biliary duct dilatation. Liver: No focal lesion identified. Liver echogenicity is increased diffusely. Portal vein is patent on color Doppler imaging with normal direction of blood flow towards the liver. Other: None. IMPRESSION: 1. A 6 mm apparent polyp within the gallbladder. Per consensus guidelines, a polyp of this size does not warrant additional imaging surveillance. There is sludge in the gallbladder. No evident gallstones. No gallbladder wall thickening or pericholecystic fluid. 2. Diffuse increase in liver echogenicity, a  finding indicative of hepatic steatosis. No focal liver lesions evident on this study. It must be cautioned that the sensitivity of ultrasound for detection of focal liver lesions is diminished in this circumstance. Electronically Signed   By: Lowella Grip III M.D.   On: 12/18/2020 16:03    ASSESSMENT AND PLAN: 40 yo female with PMH of B12, folate and iron deficiency and mild anemia since 20's, presented with significant weight loss, jaundice, transaminitis, and rib fracture after a fall.  1.  Macrocytic anemia, with folate deficiency  2.  Transaminitis and nonobstructive hyperbilirubinemia, with significant hepatomegaly 3.  Elevated ferritin, with history of iron deficiency 4.  Significant weight loss 5.  Anxiety, depression, PTSD, borderline personality disorder 6.  Sjogrens syndrome   Recommendations: -Patient has history of nutritional anemia, including folate, B12 and iron deficiency in the past, received IV iron and B12 injections, last follow-up in 2018 -pt is on folate 30m daily, will continue.  Her B12 level was normal at 547 on admission, methylmalonic acid pending -Her haptoglobin was negative, reticulocyte count was not elevated, no evidence of hemolysis. -Not sure how to explain her liver failure.    Final liver biopsy result pending.  However discussed with pathologist and there is no evidence of malignancy. -CT scan results reviewed and discussed with patient.  She has moderate volume ascites and moderate bilateral pleural effusions.  This may be related to her low albumin.  Thoracentesis and paracentesis ordered per GI. -Dietitian consult ordered. -blood transfusion if Hg<7.5    LOS: 4 days   KMikey Bussing DNP, AGPCNP-BC, AOCNP 01/15/21   Addendum  I have seen the patient, examined her. I agree with the assessment and and plan and have edited the notes.   I spoke with pathologist Dr. BMelina Copatoday regarding her liver biopsy.  The biopsy was negative for  malignancy including lymphoma.  Our GI pathologist is currently out of office, so the slides was sent to DEminencepathologist for review.  I personally reviewed her CT scan images, which showed new moderate bilateral pleural effusion and moderate ascites.  Ascites developed since last MRI 4 days ago. Recommend thoracentesis or  paracentesis for work-up.  CT scan otherwise negative for malignancy.  I reviewed the above with patient.  Her anemia is likely anemia of chronic disease, especially her liver disease. I do not plan to do bone marrow biopsy at this point,will wait for liver biopsy result. I also spoke with GI Dr. Bryan Lemma today. Will check copper and lead level.   Truitt Merle  01/15/2021

## 2021-01-15 NOTE — Consult Note (Signed)
Reason for Consult: Depression/Anxiety/PTSD  She reports that she continues to tolerate the Lexapro well with no side effects. She reports that she did not sleep well last night but also had a CT early this morning so her sleep was interrupted. She reports that she still has significant pain in her abdomen when eating so she is still eating the same amount. She reports no SI, HI, or AVH. Discussed that once her liver function returned to normal her outpatient provider could further add medications for better symptom control.   Diagnosis:Depression/Anxiety/PTSD    Treatment Plan: Discussed case with Dr. Dwyane Dee -Continue Lexapro 10 mg daily -Continue Atarax 50 mg TID PRN anxiety  -Establish follow up with outpatient Psychiatry and Therapy  -Given her history of multiple failed medications once outpatient consider addition of Abilify or other secondary agent once liver function normalizes

## 2021-01-16 ENCOUNTER — Inpatient Hospital Stay (HOSPITAL_COMMUNITY): Payer: 59

## 2021-01-16 ENCOUNTER — Other Ambulatory Visit (HOSPITAL_COMMUNITY): Payer: Self-pay

## 2021-01-16 DIAGNOSIS — R188 Other ascites: Secondary | ICD-10-CM

## 2021-01-16 DIAGNOSIS — E46 Unspecified protein-calorie malnutrition: Secondary | ICD-10-CM | POA: Insufficient documentation

## 2021-01-16 DIAGNOSIS — S2232XD Fracture of one rib, left side, subsequent encounter for fracture with routine healing: Secondary | ICD-10-CM | POA: Diagnosis not present

## 2021-01-16 DIAGNOSIS — R17 Unspecified jaundice: Secondary | ICD-10-CM | POA: Diagnosis not present

## 2021-01-16 DIAGNOSIS — Z9889 Other specified postprocedural states: Secondary | ICD-10-CM

## 2021-01-16 DIAGNOSIS — R748 Abnormal levels of other serum enzymes: Secondary | ICD-10-CM | POA: Diagnosis not present

## 2021-01-16 DIAGNOSIS — E43 Unspecified severe protein-calorie malnutrition: Secondary | ICD-10-CM | POA: Insufficient documentation

## 2021-01-16 HISTORY — PX: IR THORACENTESIS ASP PLEURAL SPACE W/IMG GUIDE: IMG5380

## 2021-01-16 LAB — COMPREHENSIVE METABOLIC PANEL
ALT: 65 U/L — ABNORMAL HIGH (ref 0–44)
AST: 142 U/L — ABNORMAL HIGH (ref 15–41)
Albumin: 1.8 g/dL — ABNORMAL LOW (ref 3.5–5.0)
Alkaline Phosphatase: 376 U/L — ABNORMAL HIGH (ref 38–126)
Anion gap: 7 (ref 5–15)
BUN: 10 mg/dL (ref 6–20)
CO2: 21 mmol/L — ABNORMAL LOW (ref 22–32)
Calcium: 8.1 mg/dL — ABNORMAL LOW (ref 8.9–10.3)
Chloride: 101 mmol/L (ref 98–111)
Creatinine, Ser: 0.83 mg/dL (ref 0.44–1.00)
GFR, Estimated: >60 mL/min (ref >60)
Glucose, Bld: 74 mg/dL (ref 70–99)
Potassium: 3.9 mmol/L (ref 3.5–5.1)
Sodium: 129 mmol/L — ABNORMAL LOW (ref 135–145)
Total Bilirubin: 8.5 mg/dL — ABNORMAL HIGH (ref 0.3–1.2)
Total Protein: 4.8 g/dL — ABNORMAL LOW (ref 6.5–8.1)

## 2021-01-16 LAB — CBC
HCT: 23.6 % — ABNORMAL LOW (ref 36.0–46.0)
Hemoglobin: 8 g/dL — ABNORMAL LOW (ref 12.0–15.0)
MCH: 39.8 pg — ABNORMAL HIGH (ref 26.0–34.0)
MCHC: 33.9 g/dL (ref 30.0–36.0)
MCV: 117.4 fL — ABNORMAL HIGH (ref 80.0–100.0)
Platelets: 160 10*3/uL (ref 150–400)
RBC: 2.01 MIL/uL — ABNORMAL LOW (ref 3.87–5.11)
RDW: 18 % — ABNORMAL HIGH (ref 11.5–15.5)
WBC: 10 10*3/uL (ref 4.0–10.5)
nRBC: 0 % (ref 0.0–0.2)

## 2021-01-16 LAB — GRAM STAIN

## 2021-01-16 LAB — LACTATE DEHYDROGENASE, PLEURAL OR PERITONEAL FLUID: LD, Fluid: 53 U/L — ABNORMAL HIGH (ref 3–23)

## 2021-01-16 LAB — PROTEIN, PLEURAL OR PERITONEAL FLUID: Total protein, fluid: <3 g/dL

## 2021-01-16 MED ORDER — PROSOURCE PLUS PO LIQD
30.0000 mL | Freq: Three times a day (TID) | ORAL | Status: DC
  Administered 2021-01-16 – 2021-01-17 (×2): 30 mL via ORAL
  Filled 2021-01-16 (×2): qty 30

## 2021-01-16 MED ORDER — LIDOCAINE 5 % EX PTCH
1.0000 | MEDICATED_PATCH | CUTANEOUS | 0 refills | Status: DC
  Filled 2021-01-16: qty 30, 30d supply, fill #0

## 2021-01-16 MED ORDER — LIDOCAINE HCL (PF) 1 % IJ SOLN
INTRAMUSCULAR | Status: AC | PRN
  Administered 2021-01-16: 10 mL

## 2021-01-16 MED ORDER — OXYCODONE HCL 5 MG PO TABS
2.5000 mg | ORAL_TABLET | Freq: Two times a day (BID) | ORAL | 0 refills | Status: AC
  Filled 2021-01-16: qty 3, 3d supply, fill #0

## 2021-01-16 MED ORDER — HYDROXYZINE HCL 25 MG PO TABS
50.0000 mg | ORAL_TABLET | Freq: Three times a day (TID) | ORAL | 0 refills | Status: DC | PRN
  Filled 2021-01-16: qty 60, 10d supply, fill #0

## 2021-01-16 MED ORDER — ACETAMINOPHEN 325 MG PO TABS
325.0000 mg | ORAL_TABLET | Freq: Four times a day (QID) | ORAL | 0 refills | Status: DC | PRN
Start: 2021-01-16 — End: 2021-02-04
  Filled 2021-01-16: qty 30, 8d supply, fill #0

## 2021-01-16 MED ORDER — ESCITALOPRAM OXALATE 10 MG PO TABS
10.0000 mg | ORAL_TABLET | Freq: Every day | ORAL | 0 refills | Status: DC
  Filled 2021-01-16: qty 30, 30d supply, fill #0

## 2021-01-16 MED ORDER — POLYETHYLENE GLYCOL 3350 17 GM/SCOOP PO POWD
17.0000 g | Freq: Every day | ORAL | 0 refills | Status: DC | PRN
Start: 2021-01-16 — End: 2021-01-22
  Filled 2021-01-16: qty 510, 30d supply, fill #0

## 2021-01-16 MED ORDER — FUROSEMIDE 10 MG/ML IJ SOLN
20.0000 mg | Freq: Once | INTRAMUSCULAR | Status: AC
  Administered 2021-01-16: 20 mg via INTRAVENOUS
  Filled 2021-01-16: qty 2

## 2021-01-16 MED ORDER — FOLIC ACID 1 MG PO TABS
1.0000 mg | ORAL_TABLET | Freq: Every day | ORAL | 0 refills | Status: DC
  Filled 2021-01-16: qty 30, 30d supply, fill #0

## 2021-01-16 MED ORDER — ENOXAPARIN SODIUM 40 MG/0.4ML IJ SOSY: 40.0000 mg | PREFILLED_SYRINGE | INTRAMUSCULAR | Status: DC

## 2021-01-16 MED ORDER — ADULT MULTIVITAMIN W/MINERALS CH
1.0000 | ORAL_TABLET | Freq: Every day | ORAL | Status: DC
  Administered 2021-01-16 – 2021-01-17 (×2): 1 via ORAL
  Filled 2021-01-16 (×2): qty 1

## 2021-01-16 MED ORDER — LIDOCAINE HCL 1 % IJ SOLN
INTRAMUSCULAR | Status: AC
  Filled 2021-01-16: qty 20

## 2021-01-16 NOTE — Progress Notes (Signed)
Pt not seen today.  Has a office appt w GI DR C on 5/12  S Leiann Sporer PA-C

## 2021-01-16 NOTE — Progress Notes (Signed)
Pt's d/c plan noticed.   I will schedule her F/U with me in my office in 2 weeks with repeated lab.   Truitt Merle MD 01/16/2021

## 2021-01-16 NOTE — Progress Notes (Addendum)
Family Medicine Teaching Service Daily Progress Note Intern Pager: [phone]  Patient name: Jeanette Elliott Medical record number: [id number] Date of birth: 1981-04-19 Age: 40 y.o. Gender: female  Primary Care Provider: Pcp, No Consultants: GI, Hematology, Psychiatry  Code Status: FULL  Pt Overview and Major Events to Date:  5/1: Admitted; MRCP performed 5/4: Liver Bx   Assessment and Plan: Jeanette Elliott a 40 y.o.femalewhopresenteds/p fall with new 9th rib fracture, also jaundiced with elevated LFT's and bilirubinconcerning forCLF of unknown etiology. PMH is significant foranemia, B12 and iron-deficiency, PTSD/Anxiety, HTN, lower extremity swelling, Hepatitis A.  Jaundice, Elevated LFT's and Bilirubin Liver bx performed and pathology has been sent to [facility] for interpretation, initial read did not show concern for malignancy. CT Chest Abd/Pelv showed moderate volume ascites and severe hepatic steatosis. Additional work up thus far has revealed ANA,mitochondrial antibodies, IgA, IgM, anti-smooth muscle IgG, LKM Ab and alpha-1-antitrypsinnegative.  CMP this morning showed LFT's downtrending AST 193>142, ALT 70>65, Alk Phos 400>376, Total bili 9>8.5.  -GI following, appreciate recommendations:  F/u liver biopsy  Diagnostic Paracentesis  Probably thoracentesis  -Hematology following, appreciate consultation and care -Continue 50 mg spironolactone qd -Continue 20 mg Lasix qd -Labs pending:  Hemochromatosis DNA  Thiamine  IgG  Moderate to Large Pleural Effusions Seen on CT scan yesterday. Not previously visualized on admission. Has maintained on room air.  -Probable thoracentesis  -Monitor O2 status   Low Albumin Albumin 1.8. Likely multifactorial from severe hepatic steatosis and dietary.  -Dietician consult placed by heme  Left 9th Rib Fracture: acute, stable  From fall from standing. Continues to have pain, states she did have  good relief with addition of lidocaine patch.  -Scheduled low-dose acetaminophen, 325 mg q6h  -Scheduled Ketorolac 15 mg q6h  -Schedule Oxycodone 2.5 mg BID  -Lidocaine patch daily   Hyponatremia Na stable at 129. Asymptomatic.  -Monitor with daily BMP  Cholelithiasis  Biliary Sludge Multiple small gallstones and biliary sludge appreciated on MRCP without cholecystitis or choledocholithiasis. -Consider outpatient elective cholecystectomy   Macrocytic Anemia Folate Deficiency Hgbstable8.1>8. Transfusion threshold is <7.Haptoglobin WNL. Homocysteine elevated at 19.3, path smear consistent with macrocytic anemia. Hematologist feels that anemia is likely anemia of chronic disease. -MMA pending  -Continue folic acid supplementation -Monitor CBC -F/u copper and lead level  Anxiety, Depression PTSD Borderline Personality Disorder -Psych following, appreciate care and consultation -Hydroxyzine 67m TIDPRN anxiety -Lexapro 10 mg daily  Sjogrens: chronic, stable Naphazoline HCl 1 drop in both eyes BID.  GERD  Takes Omeprazole 20 mg every morning.  -Holding, can add back as needed   Constipation: Improved Patient reports she had a normal bowel movement yesterday.  Given that she is on scheduled low-dose opioids, will continue bowel regimen below. -MiraLAX PRN  -Senna   FEN/GI:Regular diet PPx: SCD (pending paracentesis/thoracentesis)   Status is: Inpatient  Remains inpatient appropriate because:Ongoing diagnostic testing needed not appropriate for outpatient work up   Dispo: The patient is from: Home              Anticipated d/c is to: Home              Patient currently is not medically stable to d/c.  Likely d/c tomorrow.    Difficult to place patient No   Subjective:  Patient continues to have left-sided abdominal pain.  States that she felt more distended last night but had some relief with an extra Lasix dose.  She is aware that she will be having  paracentesis today and  potentially thoracentesis as well.  She did have a normal bowel movement yesterday.  States that she is coughing, wondering if it is due to the fluid around her lungs, but denies feeling short of breath.   Objective: Temp:  [98 F (36.7 C)-98.1 F (36.7 C)] 98 F (36.7 C) (05/06 0603) Pulse Rate:  [85-92] 92 (05/06 0603) Resp:  [18] 18 (05/06 0603) BP: (90-92)/(68-73) 90/73 (05/06 0603) SpO2:  [100 %] 100 % (05/06 0603) Physical Exam: General: Awake, alert, jaundiced, scleral icterus, laying in bed, parents at bedside Cardiovascular: RRR, no murmur Respiratory: CTA B, with mildly decreased breath sounds bilateral bases without crackles/rales/wheezing Abdomen: Distended, tender to palpation left upper quadrant, epigastric regions, hepatomegaly, normal active bowel sounds Extremities: No swelling appreciated bilateral lower extremities, warm and dry  Laboratory: Recent Labs  Lab 01/14/21 0246 01/15/21 0256 01/16/21 0157  WBC 8.5 10.2 10.0  HGB 7.7* 8.1* 8.0*  HCT 23.0* 23.7* 23.6*  PLT 154 153 160   Recent Labs  Lab 01/14/21 0246 01/15/21 0256 01/16/21 0157  NA 131* 129* 129*  K 4.4 4.1 3.9  CL 99 100 101  CO2 23 19* 21*  BUN _0 CREATININE 0.88 0.86 0.83  CALCIUM 8.0* 7.9* 8.1*  PROT 4.7* 4.8* 4.8*  BILITOT 9.5* 9.0* 8.5*  ALKPHOS 394* 400* 376*  ALT 79* 70* 65*  AST 247* 193* 142*  GLUCOSE 95 78 74     Imaging/Diagnostic Tests: CT CHEST W CONTRAST  Result Date: 01/15/2021 CLINICAL DATA:  Unintentional weight loss and fatigue EXAM: CT CHEST WITH CONTRAST TECHNIQUE: Multidetector CT imaging of the chest was performed during intravenous contrast administration. CONTRAST:  48m OMNIPAQUE IOHEXOL 300 MG/ML  SOLN COMPARISON:  10/23/2019 FINDINGS: Cardiovascular: Thoracic aorta and its branches are well visualized without aneurysmal dilatation or dissection. No cardiac enlargement is noted. No coronary calcifications are seen. Pulmonary artery  as visualized is within normal limits although not timed for embolus evaluation. Mediastinum/Nodes: Thoracic inlet demonstrates tiny hypodensities within the thyroid stable in appearance from the prior exam. These all measure less than 1 cm. No hilar or mediastinal adenopathy is noted. The esophagus as visualized is within normal limits. Lungs/Pleura: Bilateral moderate to large pleural effusions are noted with associated lower lobe consolidation. No focal mass lesion is seen. Upper Abdomen: Fatty infiltration of the liver is seen. Mild amount of ascites is noted. The remainder of the upper abdomen is within normal limits. Musculoskeletal: No chest wall abnormality. No acute or significant osseous findings. IMPRESSION: Bilateral moderate to large pleural effusions with associated lower lobe consolidation. Fatty liver and mild ascites. Tiny bilateral thyroid nodules stable from prior CT. No followup recommended (ref: J Am Coll Radiol. 2015 Feb;12(2): 143-50). No other acute abnormality is noted. Electronically Signed   By: MInez CatalinaM.D.   On: 01/15/2021 15:08     ESharion Settler DO 01/16/2021, 6:14 AM PGY-1, CLee's SummitIntern pager: [phone] text pages welcome

## 2021-01-16 NOTE — Progress Notes (Signed)
Initial Nutrition Assessment  DOCUMENTATION CODES:  Severe malnutrition in context of acute illness/injury  INTERVENTION:  Continue current diet order.  Add Magic cup TID with meals, each supplement provides 290 kcal and 9 grams of protein.  Add 30 ml ProSource Plus po TID, each supplement provides 100 kcal and 15 grams of protein.   Add snacks TID - RD to order.  Add MVI with minerals daily.  NUTRITION DIAGNOSIS:  Severe Malnutrition related to acute illness as evidenced by percent weight loss,energy intake < or equal to 50% for > or equal to 5 days,mild fat depletion,moderate fat depletion,mild muscle depletion,moderate muscle depletion.  GOAL:  Patient will meet greater than or equal to 90% of their needs  MONITOR:  PO intake,Supplement acceptance,Labs,Weight trends,Skin,I & O's  REASON FOR ASSESSMENT:  Consult Assessment of nutrition requirement/status  ASSESSMENT:  40 yo female with a PMH of anemia, Vitamin B12 deficiency, iron deficiency, PTSD/anxiety/BPD, HTN, LE swelling, and Hep A who presents with new 9th rib fracture s/p fall and jaundice with elevated LFTs and bilirubin. 5/1 - MRCP performed- multiple small gallstones and biliary sludge noted 5/4 - liver biopsy; CT scan showed moderate/large PEs  Possible discharge today.  Spoke with pt, mother, and father at bedside. Pt in good spirits, eager to go home. She reports not eating well at home or in the hospital because she feels bloated and does not have an appetite. She has not been eating much at home; mother endorses this. Per Epic, pt eating 0-75% of meals, mostly 0%.  She reports a 35 lb weight loss in the past 3 months, which is a 22.6% change in BW, which is significant and severe.  On exam, pt still has some muscle and fat stores, but they are limited; mostly moderate depletions.  Pt severely malnourished given above information. RD suspects chronic moderate malnutrition given pt history as well.  Pt  describes herself as a "picky" eater and does not like meat. RD to try Magic Cup TID, ProSource Plus TID, and snacks TID (strawberry yogurt and fruits) per pt request. Encourage PO intake when possible.  Medications: folic acid, Lasix, oxycodone BID, Senokot, spironolactone Labs: reviewed; Na 129  NUTRITION - FOCUSED PHYSICAL EXAM: Flowsheet Row Most Recent Value  Orbital Region Moderate depletion  Upper Arm Region Moderate depletion  Thoracic and Lumbar Region Mild depletion  Buccal Region Mild depletion  Temple Region Moderate depletion  Clavicle Bone Region Moderate depletion  Clavicle and Acromion Bone Region Moderate depletion  Scapular Bone Region Moderate depletion  Dorsal Hand Mild depletion  Patellar Region Moderate depletion  Anterior Thigh Region Moderate depletion  Posterior Calf Region Moderate depletion  Edema (RD Assessment) Mild  Hair Reviewed  Eyes Reviewed  Mouth Reviewed  Skin Reviewed  [Jaundice]  Nails Reviewed  [pink nail beds]     Diet Order:   Diet Order            Diet regular Room service appropriate? Yes; Fluid consistency: Thin  Diet effective now                EDUCATION NEEDS:  Education needs have been addressed  Skin:  Skin Assessment: Skin Integrity Issues: Skin Integrity Issues:: Incisions Incisions: R abdomen puncture  Last BM:  01/15/21  Height:  Ht Readings from Last 1 Encounters:  12/18/20 5' 7"  (1.702 m)   Weight:  Wt Readings from Last 1 Encounters:  01/11/21 54.4 kg   Ideal Body Weight:  61.4 kg  BMI: 18.79 kg/m^2  Estimated Nutritional Needs:  Kcal:  1900-2100 Protein:  75-90 grams Fluid:  >1.9 L  Derrel Nip, RD, LDN Registered Dietitian After Hours/Weekend Pager # in [place]

## 2021-01-16 NOTE — Procedures (Signed)
PROCEDURE SUMMARY:  Successful US guided left thoracentesis. Yielded 300 mL of clear amber fluid. Patient tolerated procedure well. No immediate complications. EBL = trace  Specimen was sent for labs.  Post procedure chest X-ray pending.  *Limited US of the abdomen for possible paracentesis showed only trace ascites. No safe window, no pocket of fluid to allow for safe approach for paracentesis.  Kissa Campoy S Madix Blowe PA-C 01/16/2021 1:40 PM

## 2021-01-16 NOTE — Consult Note (Addendum)
Reason for Consult: Depression/Anxiety/PTSD  She reports that she is doing ok today. She reports no SI, HI, or AVH. She reports her sleep has been ok and that her appetite is still limited due to her other conditions but is unchanged. Discussed that Primary team has discussed discharge today and that they will provide her prescriptions and that Social Work will be contacted to arrange outpatient follow up.   Diagnosis:Depression/Anxiety/PTSD    Treatment Plan: Case discussed with Dr. Dwyane Dee -Continue Lexapro 10 mg daily -Continue Atarax 50 mg TID PRN anxiety   -Establish follow up with outpatient Psychiatry and Therapy -If unable to find local Psychiatrist in [facility] number will be provided in discharge paperwork to schedule appointment for 2 weeks after discharge  -Given her history of multiple failed medications consider addition of Abilify or other secondary agent once liver function normalizes in the outpatient setting.

## 2021-01-17 DIAGNOSIS — R17 Unspecified jaundice: Secondary | ICD-10-CM | POA: Diagnosis not present

## 2021-01-17 DIAGNOSIS — D539 Nutritional anemia, unspecified: Secondary | ICD-10-CM | POA: Diagnosis not present

## 2021-01-17 DIAGNOSIS — S2232XD Fracture of one rib, left side, subsequent encounter for fracture with routine healing: Secondary | ICD-10-CM | POA: Diagnosis not present

## 2021-01-17 DIAGNOSIS — J90 Pleural effusion, not elsewhere classified: Secondary | ICD-10-CM

## 2021-01-17 LAB — COMPREHENSIVE METABOLIC PANEL
ALT: 53 U/L — ABNORMAL HIGH (ref 0–44)
AST: 119 U/L — ABNORMAL HIGH (ref 15–41)
Albumin: 1.8 g/dL — ABNORMAL LOW (ref 3.5–5.0)
Alkaline Phosphatase: 373 U/L — ABNORMAL HIGH (ref 38–126)
Anion gap: 8 (ref 5–15)
BUN: 9 mg/dL (ref 6–20)
CO2: 21 mmol/L — ABNORMAL LOW (ref 22–32)
Calcium: 8.1 mg/dL — ABNORMAL LOW (ref 8.9–10.3)
Chloride: 101 mmol/L (ref 98–111)
Creatinine, Ser: 1.01 mg/dL — ABNORMAL HIGH (ref 0.44–1.00)
GFR, Estimated: >60 mL/min (ref >60)
Glucose, Bld: 68 mg/dL — ABNORMAL LOW (ref 70–99)
Potassium: 3.8 mmol/L (ref 3.5–5.1)
Sodium: 130 mmol/L — ABNORMAL LOW (ref 135–145)
Total Bilirubin: 7.9 mg/dL — ABNORMAL HIGH (ref 0.3–1.2)
Total Protein: 4.5 g/dL — ABNORMAL LOW (ref 6.5–8.1)

## 2021-01-17 LAB — METHYLMALONIC ACID, SERUM: Methylmalonic Acid, Quantitative: 102 nmol/L (ref 0–378)
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
## 2021-01-17 NOTE — Progress Notes (Signed)
Iv removed, morning meds administered to patient, discharged instructions reviewed. Patient verbalized understanding. Patient discharged.

## 2021-01-17 NOTE — Discharge Instructions (Signed)
Dear Jeanette Elliott,   Thank you so much for allowing [place] to be part of your care!  You were admitted to [facility] for rib fracture and abnormal liver tests. You were treated with pain medications. Your doctor will go over the results of your liver biopsy when completed.   POST-HOSPITAL & CARE INSTRUCTIONS 1. Make sure to follow-up with the GI doctor on 5/12, see below. 2. Please let PCP/Specialists know of any changes that were made.  3. Please see medications section of this packet for any medication changes.   DOCTOR'S APPOINTMENT & FOLLOW UP CARE INSTRUCTIONS  Future Appointments  Date Time Provider [place]  01/22/2021  9:00 AM Cirigliano, Garvin Fila, DO [facility]  01/22/2021  1:20 PM Cirigliano, Vito V, DO [facility]    RETURN PRECAUTIONS:   Take care and be well!  [facility]  [place], [place] [address] [phone]   Suggestions For Increasing Calories And Protein . Several small meals a day are easier to eat and digest than three large ones. Space meals about 2 to 3 hours apart to maximize comfort. . Stop eating 2 to 3 hours before bed and sleep with your head elevated if gastric reflux (GERD) and heartburn are problems. . Do not eat your favorite foods if you are feeling bad. Save them for when you feel good! . Eat breakfast-type foods at any meal. Eggs are usually easy to eat and are great any time of the day. (The same goes for pancakes and waffles.) . Eat when you feel hungry. Most people have the greatest appetite in the morning because they have not eaten all night. If this is the best meal for you, then pile on those calories and other nutrients in the morning and at lunch. Then you can have a smaller dinner without losing total calories for the day. . Eat leftovers or nutritious snacks in the afternoon and early evening to round out your day. . Try homemade or  commercially prepared nutrition bars and puddings, as well as calorie- and protein-rich liquid nutritional supplements. Benefits of Physical Activity Talk to your doctor about physical activity. Light or moderate physical activity can help maintain muscle and promote an appetite. Walking in the neighborhood or the local mall is a great way to get up, get out, and get moving. If you are unsteady on your feet, try walking around the dining room table. Save Room for [employer]! Drink most fluids between meals instead of with meals. (It is fine to have a sip to help swallow food at meal time.) Fluids (which usually have fewer calories and nutrients than solid food) can take up valuable space in your stomach.  Foods Recommended High-Protein Foods Milk products Add cheese to toast, crackers, sandwiches, baked potatoes, vegetables, soups, noodles, meat, and fruit. Use reduced-fat (2%) or whole milk in place of water when cooking cereal and cream soups. Include cream sauces on vegetables and pasta. Add powdered milk to cream soups and mashed potatoes.  Eggs Have hard-cooked eggs readily available in the refrigerator. Chop and add to salads, casseroles, soups, and vegetables. Make a quick egg salad. All eggs should be well cooked to avoid the risk of harmful bacteria.  Meats, poultry, and fish Add leftover cooked meats to soups, casseroles, salads, and omelets. Make dip by mixing diced, chopped, or shredded meat with sour cream and spices.  Beans, legumes, nuts, and seeds  Sprinkle nuts and seeds on cereals, fruit, and desserts such as ice cream, pudding, and custard. Also serve nuts and seeds on vegetables, salads, and pasta. Spread peanut butter on toast, bread, English muffins, and fruit, or blend it in a milk shake. Add beans and peas to salads, soups, casseroles, and vegetable dishes.  High-Calorie Foods Butter, margarine, and  oils Melt butter or margarine over potatoes, rice, pasta, and cooked  vegetables. Add melted butter or margarine into soups and casseroles and spread on bread for sandwiches before spreading sandwich spread or peanut butter. Saut or stir-fry vegetables, meats, chicken and fish such as shrimp/scallops in olive or canola oil. A variety of oils add calories and can be used to [employer], chicken, or fish.  Milk products Add whipping cream to desserts, pancakes, waffles, fruit, and hot chocolate, and fold it into soups and casseroles. Add sour cream to baked potatoes and vegetables.  Salad dressing Use regular (not low-fat or diet) mayonnaise and salad dressing on sandwiches and in dips with vegetables and fruit.   Sweets Add jelly and honey to bread and crackers. Add jam to fruit and ice cream and as a topping over cake.   Copyright 2020  Academy of Nutrition and Dietetics. All rights reserved.

## 2021-01-19 ENCOUNTER — Telehealth: Payer: Self-pay | Admitting: Hematology

## 2021-01-19 LAB — METHYLMALONIC ACID, SERUM: Methylmalonic Acid, Quantitative: 104 nmol/L (ref 0–378)

## 2021-01-19 LAB — HEMOCHROMATOSIS DNA-PCR(C282Y,H63D)

## 2021-01-19 NOTE — Telephone Encounter (Signed)
I received staff msg from Dr. Burr Medico to schedule an appt for anemia. I cld and lft Jeanette Elliott a vm to return my call.

## 2021-01-20 LAB — HEAVY METALS, BLOOD
Arsenic: <1 ug/L (ref 0–9)
Lead: 2 ug/dL (ref 0–4)
Mercury: <1 ug/L (ref 0.0–14.9)

## 2021-01-21 LAB — CULTURE, BODY FLUID W GRAM STAIN -BOTTLE: Culture: NO GROWTH

## 2021-01-22 ENCOUNTER — Telehealth: Payer: Self-pay | Admitting: General Surgery

## 2021-01-22 ENCOUNTER — Other Ambulatory Visit: Payer: Self-pay

## 2021-01-22 ENCOUNTER — Encounter: Payer: Self-pay | Admitting: Gastroenterology

## 2021-01-22 ENCOUNTER — Ambulatory Visit (INDEPENDENT_AMBULATORY_CARE_PROVIDER_SITE_OTHER): Payer: 59 | Admitting: Family Medicine

## 2021-01-22 ENCOUNTER — Encounter: Payer: Self-pay | Admitting: Family Medicine

## 2021-01-22 ENCOUNTER — Ambulatory Visit (INDEPENDENT_AMBULATORY_CARE_PROVIDER_SITE_OTHER): Payer: 59 | Admitting: Gastroenterology

## 2021-01-22 VITALS — BP 82/60 | HR 97 | Temp 98.0°F | Ht 67.0 in | Wt 134.6 lb

## 2021-01-22 VITALS — BP 94/68 | HR 101 | Ht 67.0 in | Wt 135.1 lb

## 2021-01-22 DIAGNOSIS — F419 Anxiety disorder, unspecified: Secondary | ICD-10-CM

## 2021-01-22 DIAGNOSIS — R17 Unspecified jaundice: Secondary | ICD-10-CM

## 2021-01-22 DIAGNOSIS — R748 Abnormal levels of other serum enzymes: Secondary | ICD-10-CM

## 2021-01-22 DIAGNOSIS — R7401 Elevation of levels of liver transaminase levels: Secondary | ICD-10-CM

## 2021-01-22 DIAGNOSIS — R188 Other ascites: Secondary | ICD-10-CM | POA: Diagnosis not present

## 2021-01-22 DIAGNOSIS — D539 Nutritional anemia, unspecified: Secondary | ICD-10-CM

## 2021-01-22 DIAGNOSIS — R630 Anorexia: Secondary | ICD-10-CM

## 2021-01-22 DIAGNOSIS — R634 Abnormal weight loss: Secondary | ICD-10-CM

## 2021-01-22 DIAGNOSIS — Z09 Encounter for follow-up examination after completed treatment for conditions other than malignant neoplasm: Secondary | ICD-10-CM | POA: Diagnosis not present

## 2021-01-22 DIAGNOSIS — R600 Localized edema: Secondary | ICD-10-CM | POA: Diagnosis not present

## 2021-01-22 DIAGNOSIS — E8809 Other disorders of plasma-protein metabolism, not elsewhere classified: Secondary | ICD-10-CM

## 2021-01-22 DIAGNOSIS — J90 Pleural effusion, not elsewhere classified: Secondary | ICD-10-CM

## 2021-01-22 DIAGNOSIS — D520 Dietary folate deficiency anemia: Secondary | ICD-10-CM

## 2021-01-22 MED ORDER — SPIRONOLACTONE 50 MG PO TABS: 50.0000 mg | ORAL_TABLET | Freq: Every morning | ORAL | 0 refills | Status: DC

## 2021-01-22 MED ORDER — EPINEPHRINE 0.3 MG/0.3ML IJ SOAJ: 0.3000 mg | Freq: Once | INTRAMUSCULAR | 0 refills | Status: AC | PRN

## 2021-01-22 MED ORDER — ALBUTEROL SULFATE HFA 108 (90 BASE) MCG/ACT IN AERS: 2.0000 | INHALATION_SPRAY | Freq: Four times a day (QID) | RESPIRATORY_TRACT | 1 refills | Status: AC | PRN

## 2021-01-22 MED ORDER — FUROSEMIDE 20 MG PO TABS: 20.0000 mg | ORAL_TABLET | Freq: Every day | ORAL | 1 refills | Status: DC

## 2021-01-22 MED ORDER — HYDROXYZINE HCL 25 MG PO TABS: 50.0000 mg | ORAL_TABLET | Freq: Every day | ORAL | 0 refills | Status: DC

## 2021-01-22 MED ORDER — ESCITALOPRAM OXALATE 10 MG PO TABS: 10.0000 mg | ORAL_TABLET | Freq: Every day | ORAL | 1 refills | Status: DC

## 2021-01-22 NOTE — Patient Instructions (Addendum)
Call Dr. Ernestina Penna office [phone] to schedule appt   Call [place] [phone] to schedule appt  [place]: [email or web address]  [email or web address]  [email or web address] [email or web address]  [facility] [email or web address]  [email or web address]   Increase lasix 20m daily x 5 days then reassess and resume 255mdaily

## 2021-01-22 NOTE — Patient Instructions (Signed)
If you are age 39 or older, your body mass index should be between 23-30. Your Body mass index is 21.16 kg/m. If this is out of the aforementioned range listed, please consider follow up with your Primary Care Provider.  If you are age 68 or younger, your body mass index should be between 19-25. Your Body mass index is 21.16 kg/m. If this is out of the aformentioned range listed, please consider follow up with your Primary Care Provider.   Please go to the 2nd floor of this building on 01/27/2021 and schedule your labwork, [place], [address].  We have placed referrals to [place] and Opthamology, they will contact you with an appointment.   Please follow a low 2 qm sodium diet. We have given you a hand out.  Return to clinic in 3 months, you will need to call to schedule at [phone].  Thank you for choosing me and [facility].  Vito Cirigliano, D.O.

## 2021-01-22 NOTE — Progress Notes (Signed)
Chief Complaint: Hospital follow-up, lower extremity edema   GI history: 40 year old female with a history of PTSD, anxiety, HTN, Sjogren's syndrome, B12 and iron deficiency starting in her teenage years, previously treated with IV iron and B12 injections, referred to the GI clinic for evaluation of significantly elevated liver enzymes, hepatic steatosis, hyperbilirubinemia, weight loss (45#over several months in 2022).  Was previously seen by Dr. Maretta Los at [facility], and has since transitioned her care to [facility] due to insurance coverage.   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  MCV 117 -HIV negative, acute viral hepatitis panel negative -INR 1.3 -Pending labs include: thiamine, liver biopsy result - Heavy metals negative -Liver biopsy completed 01/14/2021: Specimen being sent to [facility]  - Abdominal US (12/18/2020): GB sludge, 6 mm GB polyp, hepatic steatosis -MRCP (01/11/2021): Enlarged, heterogenous appearance liver 25 cm, CBD 5 mm without any intrahepatic duct dilation. No CDL. GB sludge with small stones without cholecystitis. Normal pancreas.  - CT chest (01/15/2021): Moderate to large bilateral pleural effusions with associated lower lobe consolidation, new from prior.  No mass. - CT abdomen/pelvis (01/15/2021): Hepatomegaly, severe hepatic steatosis, moderate volume ascites, new from prior. - Abdominal ultrasound for paracentesis (01/16/2021): Trace perihepatic ascites without safe window for paracentesis - Left thoracentesis (01/16/2021): 300 cc pleural fluid removed  -Seen by inpatient Hematology service.  Felt  this was not Hemochromatosis based on the prior history of iron deficiency anemia.  Started on folic acid for macrocytic anemia.  No blood transfusion needed - Seen by inpatient Psychiatry service for severe anxiety, PTSD and increased Vistaril and started on Lexapro  Labs at discharge on 01/17/2021: - NA 130, BUN/creatinine 9/1 - AST/ALT/ALP 819/53/373, T bili 7.9 (all downtrending) - H/H 8/23.6, MCV/RDW 117/18 - PLT 160   HPI:     Jeanette Elliott is a 40 y.o. female referred to the Gastroenterology Clinic for hospital follow-up.  Admitted earlier this month with extensive hepatic work-up as outlined above.  Liver biopsy was sent to [facility] and still pending.    Was seen by her PCP, Dr. Letta Median earlier today and increased Lasix to 40 mg/daily x5 days for treatment of LE edema and ascites. Still taking aldactone 50 mg/day.  Her main complaint today is of deep edema and feeling of abdominal fullness.  Still with reduced p.o. intake.  Also with difficulty reading due to blurred vision.  Has been recently seen by her optometrist.  No liver labs, CBC, or abdominal imaging for review today.  She presents today with her mother.   Past Medical History:  Diagnosis Date  . Asthma   . Elevated liver enzymes   . Jaundice   . Sjogren's syndrome [facility])      Past Surgical History:  Procedure Laterality Date  . IR THORACENTESIS ASP PLEURAL SPACE W/IMG GUIDE  01/16/2021  . SUBMANDIBULAR GLAND EXCISION    . TONSILLECTOMY     Family History  Problem Relation Age of Onset  . Colon cancer Neg Hx   . Esophageal cancer Neg Hx    Social History   Tobacco Use  . Smoking status: Never Smoker  .  Smokeless tobacco: Never Used  Vaping Use  . Vaping Use: Never used  Substance Use Topics  . Alcohol use: Not Currently  . Drug use: Not Currently   Current Outpatient Medications  Medication Sig Dispense Refill  . acetaminophen (TYLENOL) 325 MG tablet Take 1 tablet (325 mg total) by  mouth every 6 (six) hours as needed for mild pain. 30 tablet 0  . escitalopram (LEXAPRO) 10 MG tablet Take 1 tablet (10 mg total) by mouth daily. 90 tablet 1  . folic acid (FOLVITE) 1 MG tablet Take 1 tablet (1 mg total) by mouth daily. 30 tablet 0  . furosemide (LASIX) 20 MG tablet Take 1 tablet (20 mg total) by mouth daily. 60 tablet 1  . hydrOXYzine (ATARAX/VISTARIL) 25 MG tablet Take 2 tablets (50 mg total) by mouth at bedtime. 90 tablet 0  . Multiple Vitamins-Minerals (ADULT ONE DAILY GUMMIES) CHEW Chew 2 tablets by mouth daily.    Marland Kitchen spironolactone (ALDACTONE) 50 MG tablet Take 1 tablet (50 mg total) by mouth every morning. 90 tablet 0  . albuterol (VENTOLIN HFA) 108 (90 Base) MCG/ACT inhaler Inhale 2 puffs into the lungs every 6 (six) hours as needed (asthma attacks). (Patient not taking: Reported on 01/22/2021) 8 g 1  . EPINEPHrine 0.3 mg/0.3 mL IJ SOAJ injection Inject 0.3 mg into the muscle once as needed for anaphylaxis. (Patient not taking: Reported on 01/22/2021) 1 each 0  . Naphazoline HCl (CLEAR EYES OP) Place 1 drop into both eyes 2 (two) times daily. (Patient not taking: Reported on 01/22/2021)     No current facility-administered medications for this visit.   Allergies  Allergen Reactions  . Latex Rash     Review of Systems: All systems reviewed and negative except where noted in HPI.     Physical Exam:    Wt Readings from Last 3 Encounters:  01/22/21 135 lb 2 oz (61.3 kg)  01/22/21 134 lb 9.6 oz (61.1 kg)  01/11/21 120 lb (54.4 kg)    BP 94/68   Pulse (!) 101   Ht 5' 7"  (1.702 m)   Wt 135 lb 2 oz (61.3 kg)   LMP  (LMP Unknown)   BMI 21.16 kg/m  Constitutional:  Pleasant, in no acute distress. Psychiatric: Normal mood and affect. Behavior is normal. EENT: Icteric sclera.   Cardiovascular: Slightly tachycardic, regular rhythm. No edema Pulmonary/chest: Effort normal and breath sounds normal. No wheezing, rales or rhonchi. Abdominal: TTP along left rib cage and  costosternal margin  2/2 rib fracture.  Abdomen soft.  Minimal, nonfocal TTP without rebound or guarding.  Bowel sounds active throughout.  Neurological: Alert and oriented to person place and time. Skin: Jaundiced complexion.   ASSESSMENT AND PLAN;   1) Hyperbilirubinemia/jaundice 2) Elevated alkaline phosphatase 3) Elevated liver enzymes 4) Hypoalbuminemia 5) LE edema 6) Pleural effusion 7) Hepatomegaly on imaging 8) Mild coagulopathy 9) Gallbladder sludge 10) Weight loss/Decreased appetite 11) Iron overload 12) Macrocytic/folate deficient anemia  Had a long discussion today with the patient and her mother.  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  Otherwise, extended serologic and radiographic work-up has been unrevealing for AIH, Wilson disease, viral hepatitis, PBC, PSC, etc. Liver biopsy performed and sent off to [facility]ology, with final read still pending.  Preliminary read was without e/o malignancy.  Plan as below:  - Agree with increase in Lasix to 40  mg/day.  Will repeat BMP next week and if no renal injury, plan to increase Aldactone as well for continued diuresis - Evaluate for improvement in LE edema with diuresis as above - Evaluate for improvement in appetite/abdominal fullness with diuresis, as that could be 2/2 abdominal edema - Encouraged improved p.o. intake with High-protein diet from primarily plant-based foods.  Avoid red meat.  No raw or undercooked meat, seafood, or shellfish. -Low-fat/cholesterol/carbohydrate diet.  -Offered Nutrition consult; deferred today by patient - <2 gm Na diet - Given lack of clear etiology, I will refer her to [place] for further evaluation - Follow-up in the Pulmonary Clinic - Continue folic acid - Continue follow-up in the Hematology clinic as planned - Referral to Ophthalmology for  blurry vision - Check CBC, PT/INR, and liver enzymes next week  I spent 45 minutes of time, including in depth chart review, independent review of results as outlined above, communicating results with the patient directly, face-to-face time with the patient, coordinating care, ordering studies and medications as appropriate, and documentation.   [place], DO, FACG  01/22/2021, 1:44 PM   No ref. provider found
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
## 2021-01-22 NOTE — Telephone Encounter (Signed)
External referral placed for [facility] to see Hepatology faxed to [phone] waiting for a date of appt. Referral will be sent to Opthalmology

## 2021-01-23 ENCOUNTER — Encounter (HOSPITAL_COMMUNITY): Payer: Self-pay

## 2021-01-23 LAB — SURGICAL PATHOLOGY

## 2021-01-26 ENCOUNTER — Telehealth: Payer: Self-pay

## 2021-01-26 LAB — VITAMIN B1: Vitamin B1 (Thiamine): 70.9 nmol/L (ref 66.5–200.0)

## 2021-01-26 NOTE — Telephone Encounter (Signed)
Received a PA for Epinephrine from the pharmacy, tried to do a PA through cover my meds but none was needed.  Call the insurance company and spoke to Joe at [employer], he called the pharmacy while waiting on hold and was able to get the RX to go through without a PA.  Dm/cma

## 2021-01-27 ENCOUNTER — Other Ambulatory Visit (INDEPENDENT_AMBULATORY_CARE_PROVIDER_SITE_OTHER): Payer: 59

## 2021-01-27 ENCOUNTER — Telehealth: Payer: Self-pay

## 2021-01-27 ENCOUNTER — Telehealth: Payer: Self-pay | Admitting: Nurse Practitioner

## 2021-01-27 ENCOUNTER — Other Ambulatory Visit: Payer: Self-pay

## 2021-01-27 ENCOUNTER — Other Ambulatory Visit: Payer: Self-pay | Admitting: Nurse Practitioner

## 2021-01-27 DIAGNOSIS — R600 Localized edema: Secondary | ICD-10-CM | POA: Diagnosis not present

## 2021-01-27 DIAGNOSIS — R17 Unspecified jaundice: Secondary | ICD-10-CM

## 2021-01-27 DIAGNOSIS — R188 Other ascites: Secondary | ICD-10-CM

## 2021-01-27 DIAGNOSIS — R7401 Elevation of levels of liver transaminase levels: Secondary | ICD-10-CM | POA: Diagnosis not present

## 2021-01-27 DIAGNOSIS — T502X5A Adverse effect of carbonic-anhydrase inhibitors, benzothiadiazides and other diuretics, initial encounter: Secondary | ICD-10-CM

## 2021-01-27 DIAGNOSIS — E876 Hypokalemia: Secondary | ICD-10-CM

## 2021-01-27 LAB — CBC
HCT: 28.6 % — ABNORMAL LOW (ref 36.0–46.0)
Hemoglobin: 9.8 g/dL — ABNORMAL LOW (ref 12.0–15.0)
MCHC: 34.1 g/dL (ref 30.0–36.0)
MCV: 114.7 fl — ABNORMAL HIGH (ref 78.0–100.0)
Platelets: 275 10*3/uL (ref 150.0–400.0)
RBC: 2.49 Mil/uL — ABNORMAL LOW (ref 3.87–5.11)
RDW: 15.8 % — ABNORMAL HIGH (ref 11.5–15.5)
WBC: 10.3 10*3/uL (ref 4.0–10.5)

## 2021-01-27 LAB — COMPREHENSIVE METABOLIC PANEL
ALT: 83 U/L — ABNORMAL HIGH (ref 0–35)
AST: 198 U/L — ABNORMAL HIGH (ref 0–37)
Albumin: 2.8 g/dL — ABNORMAL LOW (ref 3.5–5.2)
Alkaline Phosphatase: 415 U/L — ABNORMAL HIGH (ref 39–117)
BUN: 3 mg/dL — ABNORMAL LOW (ref 6–23)
CO2: 34 mEq/L — ABNORMAL HIGH (ref 19–32)
Calcium: 7.9 mg/dL — ABNORMAL LOW (ref 8.4–10.5)
Chloride: 88 mEq/L — ABNORMAL LOW (ref 96–112)
Creatinine, Ser: 0.71 mg/dL (ref 0.40–1.20)
GFR: 107.76 mL/min (ref >60.00)
Glucose, Bld: 90 mg/dL (ref 70–99)
Potassium: 2.7 mEq/L — CL (ref 3.5–5.1)
Sodium: 133 mEq/L — ABNORMAL LOW (ref 135–145)
Total Bilirubin: 9.2 mg/dL — ABNORMAL HIGH (ref 0.2–1.2)
Total Protein: 5.6 g/dL — ABNORMAL LOW (ref 6.0–8.3)

## 2021-01-27 MED ORDER — POTASSIUM CHLORIDE CRYS ER 20 MEQ PO TBCR: 40.0000 meq | EXTENDED_RELEASE_TABLET | Freq: Every day | ORAL | 0 refills | Status: DC

## 2021-01-27 NOTE — Telephone Encounter (Signed)
Spoke with Jeanette Elliott of [facility] lab with a critical lab value, pt has critically low potassium 2.7, notified DOC of the day DR. Ethelene Hal, and [place]

## 2021-01-27 NOTE — Telephone Encounter (Signed)
Pt informed and scheduled to repeat lab next week.

## 2021-01-28 ENCOUNTER — Other Ambulatory Visit: Payer: Self-pay

## 2021-01-28 ENCOUNTER — Telehealth: Payer: Self-pay | Admitting: General Surgery

## 2021-01-28 ENCOUNTER — Other Ambulatory Visit: Payer: 59

## 2021-01-28 ENCOUNTER — Other Ambulatory Visit (INDEPENDENT_AMBULATORY_CARE_PROVIDER_SITE_OTHER): Payer: 59

## 2021-01-28 DIAGNOSIS — R17 Unspecified jaundice: Secondary | ICD-10-CM

## 2021-01-28 DIAGNOSIS — D539 Nutritional anemia, unspecified: Secondary | ICD-10-CM | POA: Diagnosis not present

## 2021-01-28 DIAGNOSIS — R748 Abnormal levels of other serum enzymes: Secondary | ICD-10-CM

## 2021-01-28 DIAGNOSIS — Z09 Encounter for follow-up examination after completed treatment for conditions other than malignant neoplasm: Secondary | ICD-10-CM

## 2021-01-28 DIAGNOSIS — R7401 Elevation of levels of liver transaminase levels: Secondary | ICD-10-CM

## 2021-01-28 NOTE — Telephone Encounter (Signed)
-----   Message from [place], DO sent at 01/27/2021  3:28 PM EDT ----- Agree, this is a very challenging case. I thought copper would be on the panel as well. I saw her in f/u last week and given the lack of clear answer but ongoing jaundice, I am sending her to [facility] [place]. Now with the final biopsy read, I feel even more confident that she needs to be at a referral center with a dedicated transplant hepatologist. I will be curious to see what they find.   Thank you again for your help with her! Such a tough case.   Azell Der, Can we see if we can add copper to her lab collect from earlier today??   ----- Message ----- From: Truitt Merle, MD Sent: 01/27/2021   2:43 PM EDT To: Lavena Bullion, DO, Royston Bake, RN  [facility],  I saw her liver biopsy result, looks like we still do not have a clear answer.   I ordered heavy metals panel in hospital, which came back normal. I thought cooper was included but it's not. I saw that she had lab done today, not sure if we can still add cooper level to CMP.   I am not sure if I can contribute more, but will see her back if she needs blood transfusion. I will f/u her CBC today   Thanks   Krista Blue

## 2021-01-28 NOTE — Telephone Encounter (Signed)
The patient will come to [facility] to have labs drawn, told to go to the second floor.

## 2021-01-29 LAB — BASIC METABOLIC PANEL
BUN/Creatinine Ratio: 5 (calc) — ABNORMAL LOW (ref 6–22)
BUN: 3 mg/dL — ABNORMAL LOW (ref 7–25)
CO2: 32 mmol/L (ref 20–32)
Calcium: 8.3 mg/dL — ABNORMAL LOW (ref 8.6–10.2)
Chloride: 90 mmol/L — ABNORMAL LOW (ref 98–110)
Creat: 0.64 mg/dL (ref 0.50–1.10)
Glucose, Bld: 89 mg/dL (ref 65–99)
Potassium: 3.2 mmol/L — ABNORMAL LOW (ref 3.5–5.3)
Sodium: 135 mmol/L (ref 135–146)

## 2021-01-29 LAB — CERULOPLASMIN: Ceruloplasmin: 36 mg/dL (ref 18–53)

## 2021-01-30 ENCOUNTER — Telehealth: Payer: Self-pay | Admitting: General Surgery

## 2021-01-30 NOTE — Telephone Encounter (Signed)
Copied from IM chat this am at 8:36 Edsel Shives-Good morning, I wanted to let you know that I have contacted Jeanette Elliott this morning. she is not doing to well, she states her stomach is now the size of being 8 months pregnant and her pain level is between a 7&8. she is already on potassium and I have a call into [facility]. Is there any suggestions for her? she is curious if she needs to be drained?   Dr Bryan Lemma- If abdomen is distended, I would be curious about ascites that may need to be drained, or at the very least sampled. She had ascites during her recent admission, but then no significnat ascites when rads tried to do paracentesis. Not unreasonable to go to ER for evaluation and likely labs and ultrasound. Depending on results, we may need to actually transfer from our ER to [place]? Alternative would be going right to [facility] ER with likely admission. Let me know what she decides. Thanks for helping her out!   [name]-The patient now has decided not to go to the ED. she stated if it gets worse she will.   Dr Jaclynn Major

## 2021-01-30 NOTE — Telephone Encounter (Signed)
Contacted the patient and she stated she is already on potassium. Patient complains of her stomach being swollen to about 8 months pregnant, and her pain level is between a 7&8. She would like to know if she should go to the hospital to have a paracentesis?

## 2021-01-30 NOTE — Telephone Encounter (Signed)
Contacted [facility] regarding the status of referral, spoke with Katharine Look, she stated that the referral had not yet been processed even though it was marked urgent. She transferred me to the GI/Hepatology department and I spoke with Estill Bamberg. She asked if I would send over the referral again and they will contact the patient. I expressed the urgency and was told the team will decide if it is something they can do. Fax # [phone]

## 2021-01-30 NOTE — Telephone Encounter (Signed)
Received a call from the patients mother that they went to [facility] ED and they decided to leave because the ED Dr stated they would have to work her up before any treatment could be done. Meanwhile, they left and called to see if we would set her up to have a paracentesis to be done at [facility]. I explained to her that the reason she was told to go to one of the ED's either [facility] or [facility] was to be evaluated. Patients mother needed to hang up because the [place] clinic was on the other line calling.

## 2021-01-30 NOTE — Telephone Encounter (Signed)
-----   Message from [place], DO sent at 01/29/2021 12:06 PM EDT ----- Repeat labs notable for potassium 2.7.  Please start KCL 20 mEq BID x1 week with repeat BMP in 7 days.  Liver enzymes, alkaline phosphatase, bilirubin all slightly up trended from 10 days prior, with T bili 9.2.  Hemoglobin has improved, now at 9.8, with otherwise normal WBC and platelets.  Please check on the status of her referral to [facility].

## 2021-01-31 LAB — COPPER, SERUM: Copper: 173 ug/dL (ref 70–175)

## 2021-02-02 ENCOUNTER — Emergency Department (HOSPITAL_COMMUNITY): Payer: 59

## 2021-02-02 ENCOUNTER — Other Ambulatory Visit: Payer: Self-pay

## 2021-02-02 ENCOUNTER — Encounter (HOSPITAL_COMMUNITY): Payer: Self-pay

## 2021-02-02 ENCOUNTER — Telehealth: Payer: Self-pay | Admitting: Family Medicine

## 2021-02-02 ENCOUNTER — Inpatient Hospital Stay (HOSPITAL_COMMUNITY)
Admission: EM | Admit: 2021-02-02 | Discharge: 2021-02-04 | DRG: 432 | Disposition: A | Discharge status: Home / Self Care | Payer: 59 | Attending: Family Medicine | Admitting: Family Medicine

## 2021-02-02 ENCOUNTER — Telehealth: Payer: Self-pay | Admitting: General Surgery

## 2021-02-02 DIAGNOSIS — K7460 Unspecified cirrhosis of liver: Secondary | ICD-10-CM | POA: Diagnosis not present

## 2021-02-02 DIAGNOSIS — R140 Abdominal distension (gaseous): Secondary | ICD-10-CM

## 2021-02-02 DIAGNOSIS — E8809 Other disorders of plasma-protein metabolism, not elsewhere classified: Secondary | ICD-10-CM | POA: Diagnosis present

## 2021-02-02 DIAGNOSIS — F419 Anxiety disorder, unspecified: Secondary | ICD-10-CM | POA: Diagnosis not present

## 2021-02-02 DIAGNOSIS — K219 Gastro-esophageal reflux disease without esophagitis: Secondary | ICD-10-CM | POA: Diagnosis present

## 2021-02-02 DIAGNOSIS — J90 Pleural effusion, not elsewhere classified: Secondary | ICD-10-CM | POA: Diagnosis present

## 2021-02-02 DIAGNOSIS — E871 Hypo-osmolality and hyponatremia: Secondary | ICD-10-CM | POA: Diagnosis present

## 2021-02-02 DIAGNOSIS — K7290 Hepatic failure, unspecified without coma: Secondary | ICD-10-CM | POA: Diagnosis present

## 2021-02-02 DIAGNOSIS — K7581 Nonalcoholic steatohepatitis (NASH): Secondary | ICD-10-CM | POA: Diagnosis present

## 2021-02-02 DIAGNOSIS — Z6820 Body mass index (BMI) 20.0-20.9, adult: Secondary | ICD-10-CM | POA: Diagnosis not present

## 2021-02-02 DIAGNOSIS — B159 Hepatitis A without hepatic coma: Secondary | ICD-10-CM | POA: Diagnosis present

## 2021-02-02 DIAGNOSIS — D509 Iron deficiency anemia, unspecified: Secondary | ICD-10-CM | POA: Diagnosis present

## 2021-02-02 DIAGNOSIS — F32A Depression, unspecified: Secondary | ICD-10-CM | POA: Diagnosis present

## 2021-02-02 DIAGNOSIS — R188 Other ascites: Secondary | ICD-10-CM | POA: Diagnosis present

## 2021-02-02 DIAGNOSIS — Z20822 Contact with and (suspected) exposure to covid-19: Secondary | ICD-10-CM | POA: Diagnosis present

## 2021-02-02 DIAGNOSIS — J9811 Atelectasis: Secondary | ICD-10-CM | POA: Diagnosis present

## 2021-02-02 DIAGNOSIS — K766 Portal hypertension: Secondary | ICD-10-CM | POA: Diagnosis present

## 2021-02-02 DIAGNOSIS — F4310 Post-traumatic stress disorder, unspecified: Secondary | ICD-10-CM | POA: Diagnosis present

## 2021-02-02 DIAGNOSIS — R6881 Early satiety: Secondary | ICD-10-CM | POA: Diagnosis present

## 2021-02-02 DIAGNOSIS — E43 Unspecified severe protein-calorie malnutrition: Secondary | ICD-10-CM | POA: Diagnosis present

## 2021-02-02 DIAGNOSIS — E876 Hypokalemia: Secondary | ICD-10-CM | POA: Diagnosis present

## 2021-02-02 DIAGNOSIS — D539 Nutritional anemia, unspecified: Secondary | ICD-10-CM | POA: Diagnosis present

## 2021-02-02 DIAGNOSIS — Z79899 Other long term (current) drug therapy: Secondary | ICD-10-CM | POA: Diagnosis not present

## 2021-02-02 DIAGNOSIS — I959 Hypotension, unspecified: Secondary | ICD-10-CM | POA: Diagnosis present

## 2021-02-02 DIAGNOSIS — Z91040 Latex allergy status: Secondary | ICD-10-CM

## 2021-02-02 DIAGNOSIS — K7469 Other cirrhosis of liver: Principal | ICD-10-CM | POA: Diagnosis present

## 2021-02-02 DIAGNOSIS — R1010 Upper abdominal pain, unspecified: Secondary | ICD-10-CM | POA: Diagnosis not present

## 2021-02-02 DIAGNOSIS — R945 Abnormal results of liver function studies: Secondary | ICD-10-CM | POA: Diagnosis not present

## 2021-02-02 DIAGNOSIS — D689 Coagulation defect, unspecified: Secondary | ICD-10-CM | POA: Diagnosis present

## 2021-02-02 DIAGNOSIS — R000 Tachycardia, unspecified: Secondary | ICD-10-CM | POA: Diagnosis present

## 2021-02-02 DIAGNOSIS — R17 Unspecified jaundice: Secondary | ICD-10-CM | POA: Diagnosis not present

## 2021-02-02 DIAGNOSIS — I10 Essential (primary) hypertension: Secondary | ICD-10-CM | POA: Diagnosis present

## 2021-02-02 DIAGNOSIS — R634 Abnormal weight loss: Secondary | ICD-10-CM | POA: Diagnosis present

## 2021-02-02 LAB — PROTIME-INR
INR: 1.3 — ABNORMAL HIGH (ref 0.8–1.2)
Prothrombin Time: 16.4 seconds — ABNORMAL HIGH (ref 11.4–15.2)

## 2021-02-02 LAB — COMPREHENSIVE METABOLIC PANEL
ALT: 120 U/L — ABNORMAL HIGH (ref 0–44)
AST: 519 U/L — ABNORMAL HIGH (ref 15–41)
Albumin: 2.4 g/dL — ABNORMAL LOW (ref 3.5–5.0)
Alkaline Phosphatase: 416 U/L — ABNORMAL HIGH (ref 38–126)
Anion gap: 17 — ABNORMAL HIGH (ref 5–15)
BUN: <5 mg/dL — ABNORMAL LOW (ref 6–20)
CO2: 29 mmol/L (ref 22–32)
Calcium: 8 mg/dL — ABNORMAL LOW (ref 8.9–10.3)
Chloride: 87 mmol/L — ABNORMAL LOW (ref 98–111)
Creatinine, Ser: 0.78 mg/dL (ref 0.44–1.00)
GFR, Estimated: >60 mL/min (ref >60)
Glucose, Bld: 85 mg/dL (ref 70–99)
Potassium: 3.2 mmol/L — ABNORMAL LOW (ref 3.5–5.1)
Sodium: 133 mmol/L — ABNORMAL LOW (ref 135–145)
Total Bilirubin: 9.9 mg/dL — ABNORMAL HIGH (ref 0.3–1.2)
Total Protein: 6.1 g/dL — ABNORMAL LOW (ref 6.5–8.1)

## 2021-02-02 LAB — CBC WITH DIFFERENTIAL/PLATELET
Abs Immature Granulocytes: 0.04 10*3/uL (ref 0.00–0.07)
Basophils Absolute: 0.1 10*3/uL (ref 0.0–0.1)
Basophils Relative: 1 %
Eosinophils Absolute: 0.1 10*3/uL (ref 0.0–0.5)
Eosinophils Relative: 1 %
HCT: 32.5 % — ABNORMAL LOW (ref 36.0–46.0)
Hemoglobin: 10.5 g/dL — ABNORMAL LOW (ref 12.0–15.0)
Immature Granulocytes: 0 %
Lymphocytes Relative: 28 %
Lymphs Abs: 2.9 10*3/uL (ref 0.7–4.0)
MCH: 37.1 pg — ABNORMAL HIGH (ref 26.0–34.0)
MCHC: 32.3 g/dL (ref 30.0–36.0)
MCV: 114.8 fL — ABNORMAL HIGH (ref 80.0–100.0)
Monocytes Absolute: 1.5 10*3/uL — ABNORMAL HIGH (ref 0.1–1.0)
Monocytes Relative: 14 %
Neutro Abs: 5.7 10*3/uL (ref 1.7–7.7)
Neutrophils Relative %: 56 %
Platelets: 247 10*3/uL (ref 150–400)
RBC: 2.83 MIL/uL — ABNORMAL LOW (ref 3.87–5.11)
RDW: 14.2 % (ref 11.5–15.5)
WBC: 10.4 10*3/uL (ref 4.0–10.5)
nRBC: 0 % (ref 0.0–0.2)

## 2021-02-02 LAB — RESP PANEL BY RT-PCR (FLU A&B, COVID) ARPGX2
Influenza A by PCR: NEGATIVE
Influenza B by PCR: NEGATIVE
SARS Coronavirus 2 by RT PCR: NEGATIVE

## 2021-02-02 LAB — AMMONIA: Ammonia: 24 umol/L (ref 9–35)

## 2021-02-02 LAB — TYPE AND SCREEN
ABO/RH(D): O POS
Antibody Screen: NEGATIVE

## 2021-02-02 LAB — LIPASE, BLOOD: Lipase: 46 U/L (ref 11–51)

## 2021-02-02 LAB — ABO/RH: ABO/RH(D): O POS

## 2021-02-02 MED ORDER — ESCITALOPRAM OXALATE 10 MG PO TABS
10.0000 mg | ORAL_TABLET | Freq: Every day | ORAL | Status: DC
  Administered 2021-02-03 – 2021-02-04 (×2): 10 mg via ORAL
  Filled 2021-02-02 (×2): qty 1

## 2021-02-02 MED ORDER — ENOXAPARIN SODIUM 40 MG/0.4ML IJ SOSY
40.0000 mg | PREFILLED_SYRINGE | INTRAMUSCULAR | Status: DC
  Administered 2021-02-02: 40 mg via SUBCUTANEOUS
  Filled 2021-02-02: qty 0.4

## 2021-02-02 MED ORDER — FOLIC ACID 1 MG PO TABS
1.0000 mg | ORAL_TABLET | Freq: Every day | ORAL | Status: DC
  Administered 2021-02-03 – 2021-02-04 (×3): 1 mg via ORAL
  Filled 2021-02-02 (×3): qty 1

## 2021-02-02 MED ORDER — LIDOCAINE-EPINEPHRINE (PF) 2 %-1:200000 IJ SOLN
10.0000 mL | Freq: Once | INTRAMUSCULAR | Status: AC
  Administered 2021-02-02: 10 mL via INTRADERMAL
  Filled 2021-02-02: qty 20

## 2021-02-02 MED ORDER — FUROSEMIDE 20 MG PO TABS
20.0000 mg | ORAL_TABLET | Freq: Every day | ORAL | Status: DC
  Administered 2021-02-03 – 2021-02-04 (×2): 20 mg via ORAL
  Filled 2021-02-02 (×2): qty 1

## 2021-02-02 MED ORDER — POTASSIUM CHLORIDE 10 MEQ/100ML IV SOLN
10.0000 meq | INTRAVENOUS | Status: AC
  Administered 2021-02-02 – 2021-02-03 (×5): 10 meq via INTRAVENOUS
  Filled 2021-02-02 (×5): qty 100

## 2021-02-02 MED ORDER — HYDROXYZINE HCL 25 MG PO TABS
50.0000 mg | ORAL_TABLET | Freq: Three times a day (TID) | ORAL | Status: DC | PRN
  Administered 2021-02-03: 50 mg via ORAL
  Filled 2021-02-02: qty 2

## 2021-02-02 MED ORDER — SPIRONOLACTONE 25 MG PO TABS
50.0000 mg | ORAL_TABLET | Freq: Every morning | ORAL | Status: DC
  Administered 2021-02-03 – 2021-02-04 (×2): 50 mg via ORAL
  Filled 2021-02-02 (×2): qty 2

## 2021-02-02 NOTE — ED Provider Notes (Signed)
Emergency Medicine Provider Triage Evaluation Note  Jeanette Elliott , a 40 y.o. female  was evaluated in triage.  Pt complains of abdominal distension and request for paracentesis.  States she is having pain from distention.  Shortness of breath and leg swelling despite taking diuretics.  Review of Systems  Positive: Abdominal distention, shortness of breath Negative: Fever  Physical Exam  BP 102/80 (BP Location: Left Arm)   Pulse (!) 115   Temp 98.4 F (36.9 C)   Resp 15   LMP  (LMP Unknown)   SpO2 98%  Gen:   Awake, no distress   Resp:  Normal effort  MSK:   Moves extremities without difficulty  Other:  Abdominal distention noted.  Scleral icterus noted bilaterally  Medical Decision Making  Medically screening exam initiated at 4:39 PM.  Appropriate orders placed.  Kelaiah Escalona was informed that the remainder of the evaluation will be completed by another provider, this initial triage assessment does not replace that evaluation, and the importance of remaining in the ED until their evaluation is complete.  Lab work ordered   Delia Heady, PA-C 02/02/21 1640    Valarie Merino, MD 02/02/21 2330
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
## 2021-02-02 NOTE — Hospital Course (Addendum)
Jeanette Elliott is a 40 y.o. female presenting with abdominal pain. PMH is significant for cirrhosis of unknown etiology, macrocytic anemia and iron deficiency anemia, GERD, PTSD/anxiety, HTN, hepatitis A. Below is a brief summary of her hospital course:   Ascites  Cirrhosis  Patient presented with abdominal pain 2/2 ascites. She has cirrhosis of unknown etiology with first diagnosis last admission 5/1. She has been referred to [facility] for further evaluation by GI specialist here. 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  MCV 117 - HIV negative, acute viral hepatitis panel negative - INR 1.3 - Pending labs include: thiamine, liver biopsy result - Heavy metals negative - Liver biopsy completed 01/14/2021: showed marked cholestatic injury with significant cholestasis and lobular hepatocytes and mild to moderate canalicular cholestasis. The findings are not entirely specific. - Abdominal US (12/18/2020): GB sludge, 6 mm GB polyp, hepatic steatosis - MRCP (01/11/2021): Enlarged, heterogenous appearance liver 25 cm, CBD 5 mm without any intrahepatic duct dilation.  No CDL.  GB sludge with small stones without cholecystitis.  Normal pancreas.  - CT chest (01/15/2021): Moderate to large bilateral pleural effusions with associated lower lobe consolidation, new from prior.  No mass. - CT abdomen/pelvis (01/15/2021): Hepatomegaly, severe hepatic steatosis, moderate volume ascites, new from prior. - Abdominal ultrasound for paracentesis (01/16/2021): Trace perihepatic ascites without safe window for paracentesis - Left thoracentesis (01/16/2021): 300 cc pleural fluid removed  Metabolic storage disorders have not yet been fully evaluated.   This hospitalization she had paracentesis with 2.4L  removed by IR on 5/24. Fluid without WBC or organisms seen. Fluid cloudy appearing with only 2% neutrophil count thus no antibiotics were started for SBP treatment or prophylaxis.  Patient was continued on her home Lasix and Spironolactone, her blood pressures were soft (around 08'M systolic) and did not allow for further increase. She was kept on a low-sodium diet.  Patient evaluated by GI on admission and they were able to talk with [facility] Hepatologist who will arrange for closer outpatient follow up in the next few weeks. They recommended 24-hour urine copper collection which her GI physician will set up outpatient. Spironolactone increased to 100 mg and Lasix 40 mg daily on discharge.  Macrocytic and Iron Deficiency Anemia Previous admission she was seen by inpatient Hematology service who felt this was not Hemochromatosis based on the prior history of iron deficiency anemia.  Started on folic acid for macrocytic anemia at that hospitiaization Hemoglobin remained stable throughout admission (around 8, which appears to be her baseline). At this admission, she was continued on folate supplementation given deficiency noted on recent admission. She did not have blood transfusion.   Hypotension  Tachycardia Persistently hypotensive (57'Q-469'G systolic over 29'B-28'U diastolic) and tachycardic (to 110's) while admitted. Likely from her decompensated cirrhosis.   Anxiety  PTSD  She was kept on her home Lexapro and Vistaril was changed from PRN to TID scheduled given her increased anxiety while inpatient.  Other conditions chronic and stable.  Follow up recommendations Should follow up with [facility]. They will call her for a follow up in the next few weeks.  Continue to counsel on importance of medication adherence, abstinence from EtOH and <2g Na diet.  Recommend follow up BMP to evaluate for hypokalemia.  Avoid Tylenol/NSAIDs Dr. Bryan Lemma (  GI) to arrange outpatient 24-hour urine  collection for copper.  Repeat CMP in 1-week.  Will need outpatient upper endoscopy for variceal screening.  Spironolactone increased to 100 mg daily from 50 mg. Lasix increased to 40 mg daily from 20 mg.

## 2021-02-02 NOTE — Progress Notes (Signed)
The patient discussed briefly with the resident, and I reviewed her chart. The patient was recently discharged from the hospital for elevated liver enzymes, jaundice, and Ascites, for which she received diagnostic and therapeutic paracentesis, which yielded 300 cc of clear amber fluid. She had extensive workup done, including liver biopsy and MRCP significant for enlarged liver 25 cm, CBD 5 mm without any intrahepatic duct dilation, heterogeneous appearing liver with a MELD score of 18 at the time. Acute hepatitis Serology was negative and autoimmune labs were checked as well. She was d/c on diuretics with outpatient GI/hepatology f/u instructions. The patient now returns with worsening abdominal distension and pain. As discussed with the [facility] resident, I will feel comfortable if IR can perform her paracentesis today vs. ED attending, as I am not credentialed to perform this procedure. The resident will reach out to IR or ED attending. Assess for SBP and start empirical A/B coverage if indicated. I will complete a full assessment in the morning. Consider transfer to [facility] hepatologists per her GI specialist.

## 2021-02-02 NOTE — Telephone Encounter (Signed)
Spoke with Patients mother and she will try and get the patient to [facility] today for evaluation. Mother stated that Jeanette Elliott is having a lot of anxiety

## 2021-02-02 NOTE — ED Provider Notes (Signed)
[facility] EMERGENCY DEPARTMENT Provider Note   CSN: [id number] Arrival date & time: 02/02/21  1538     History Chief Complaint  Patient presents with  . Edema    Jeanette Elliott is a 40 y.o. female.  Pt complains of shortness of breath and abdominal swelling. Pt has had increasing liver enzymes and bilirubin for the past 6 weeks.  Pt had a thoracentesis 2 weeks ago.  Pt reports IR was unable to do a paracentesis because all fluid was to near her liver.  Pt reports increased abdominal swelling.  Pt has been referred to [facility] but has not been seen there yet.    The history is provided by the patient. No language interpreter was used.  Abdominal Pain Pain location:  Generalized Pain quality: aching   Pain radiates to:  Does not radiate Pain severity:  Moderate Timing:  Constant Progression:  Worsening Chronicity:  New Relieved by:  Nothing Worsened by:  Nothing Ineffective treatments:  None tried Associated symptoms: nausea   Risk factors: not pregnant        Past Medical History:  Diagnosis Date  . Asthma   . Elevated liver enzymes   . Jaundice   . Sjogren's syndrome [facility])     Patient Active Problem List   Diagnosis Date Noted  . Pleural effusion   . Protein-calorie malnutrition, severe 01/16/2021  . Ascites   . S/P thoracentesis   . Weight loss, unintentional   . Hemochromatosis   . Closed fracture of rib of left side with routine healing   . Elevated bilirubin   . Loss of weight   . Macrocytic anemia   . Folate deficiency   . Jaundice 01/11/2021  . Elevated liver enzymes   . Fall     Past Surgical History:  Procedure Laterality Date  . IR THORACENTESIS ASP PLEURAL SPACE W/IMG GUIDE  01/16/2021  . SUBMANDIBULAR GLAND EXCISION    . TONSILLECTOMY       OB History   No obstetric history on file.     Family History  Problem Relation Age of Onset  . Colon cancer Neg Hx   . Esophageal cancer Neg Hx     Social History   Tobacco  Use  . Smoking status: Never Smoker  . Smokeless tobacco: Never Used  Vaping Use  . Vaping Use: Never used  Substance Use Topics  . Alcohol use: Not Currently  . Drug use: Not Currently    Home Medications Prior to Admission medications   Medication Sig Start Date End Date Taking? Authorizing Provider  acetaminophen (TYLENOL) 325 MG tablet Take 1 tablet (325 mg total) by mouth every 6 (six) hours as needed for mild pain. 01/16/21   Sharion Settler, DO  albuterol (VENTOLIN HFA) 108 (90 Base) MCG/ACT inhaler Inhale 2 puffs into the lungs every 6 (six) hours as needed (asthma attacks). Patient not taking: Reported on 01/22/2021 01/22/21   Letta Median K, DO  EPINEPHrine 0.3 mg/0.3 mL IJ SOAJ injection Inject 0.3 mg into the muscle once as needed for anaphylaxis. Patient not taking: Reported on 01/22/2021 01/22/21   Letta Median K, DO  escitalopram (LEXAPRO) 10 MG tablet Take 1 tablet (10 mg total) by mouth daily. 01/22/21   Cirigliano, Garvin Fila, DO  folic acid (FOLVITE) 1 MG tablet Take 1 tablet (1 mg total) by mouth daily. 01/17/21   Sharion Settler, DO  furosemide (LASIX) 20 MG tablet Take 1 tablet (20 mg total) by mouth daily. 01/22/21  Cirigliano, Mary K, DO  hydrOXYzine (ATARAX/VISTARIL) 25 MG tablet Take 2 tablets (50 mg total) by mouth at bedtime. 01/22/21   Cirigliano, Garvin Fila, DO  Multiple Vitamins-Minerals (ADULT ONE DAILY GUMMIES) CHEW Chew 2 tablets by mouth daily.    [provider]  Naphazoline HCl (CLEAR EYES OP) Place 1 drop into both eyes 2 (two) times daily. Patient not taking: Reported on 01/22/2021    [provider]  potassium chloride SA (KLOR-CON) 20 MEQ tablet Take 2 tablets (40 mEq total) by mouth daily. 01/27/21   Nche, Charlene Brooke, NP  spironolactone (ALDACTONE) 50 MG tablet Take 1 tablet (50 mg total) by mouth every morning. 01/22/21   Ronnald Nian, DO    Allergies    Latex  Review of Systems   Review of Systems  Gastrointestinal:  Positive for abdominal pain and nausea.  All other systems reviewed and are negative.   Physical Exam Updated Vital Signs BP 101/76   Pulse 100   Temp 98.4 F (36.9 C)   Resp (!) 23   LMP  (LMP Unknown)   SpO2 96%   Physical Exam Vitals and nursing note reviewed.  Constitutional:      Appearance: She is well-developed.  HENT:     Head: Normocephalic.  Cardiovascular:     Rate and Rhythm: Tachycardia present.  Pulmonary:     Effort: Pulmonary effort is normal.  Abdominal:     General: There is distension.     Tenderness: There is abdominal tenderness.  Musculoskeletal:        General: Normal range of motion.     Cervical back: Normal range of motion.  Skin:    General: Skin is warm.  Neurological:     General: No focal deficit present.     Mental Status: She is alert and oriented to person, place, and time.  Psychiatric:        Mood and Affect: Mood normal.     ED Results / Procedures / Treatments   Labs (all labs ordered are listed, but only abnormal results are displayed) Labs Reviewed  COMPREHENSIVE METABOLIC PANEL - Abnormal; Notable for the following components:      Result Value   Sodium 133 (*)    Potassium 3.2 (*)    Chloride 87 (*)    BUN <5 (*)    Calcium 8.0 (*)    Total Protein 6.1 (*)    Albumin 2.4 (*)    AST 519 (*)    ALT 120 (*)    Alkaline Phosphatase 416 (*)    Total Bilirubin 9.9 (*)    Anion gap 17 (*)    All other components within normal limits  CBC WITH DIFFERENTIAL/PLATELET - Abnormal; Notable for the following components:   RBC 2.83 (*)    Hemoglobin 10.5 (*)    HCT 32.5 (*)    MCV 114.8 (*)    MCH 37.1 (*)    Monocytes Absolute 1.5 (*)    All other components within normal limits  RESP PANEL BY RT-PCR (FLU A&B, COVID) ARPGX2  LIPASE, BLOOD  AMMONIA  PROTIME-INR  TYPE AND SCREEN    EKG None  Radiology DG Chest Port 1 View  Result Date: 02/02/2021 CLINICAL DATA:  Swelling and edema. Chest pain and shortness of  breath. EXAM: PORTABLE CHEST 1 VIEW COMPARISON:  01/16/2021 radiographs and CT FINDINGS: Heart is normal in size. Normal mediastinal contours. Small pleural effusions better appreciated on CT. Streaky basilar atelectasis. No pulmonary edema.  No pneumothorax. No confluent consolidation. No acute osseous abnormalities are seen. IMPRESSION: Small pleural effusions and bibasilar atelectasis. Electronically Signed   By: Keith Rake M.D.   On: 02/02/2021 18:48    Procedures Procedures   Medications Ordered in ED Medications - No data to display  ED Course  I have reviewed the triage vital signs and the nursing notes.  Pertinent labs & imaging results that were available during my care of the patient were reviewed by me and considered in my medical decision making (see chart for details).    MDM Rules/Calculators/A&P                          MDM:  Pt's records reviewed.  Cause of illness has not been identified.  There was a question of gallbladder disease raised but pt has not seen surgery.  Appointment with [facility] has not been scheduled.    Pt's Gi doctor advised to have pt come to hospital.   Pt's bili today is 9.9   I will consult Family Practice to admit pt. She may benefit for paracentesis.  She also needs further evaluation.  ERCP/ Surgery assessment might be helpful  I spoke with Family Practice Dr. Ouida Sills  who will admit I spoke to Dr. Eliott Nine who will see and consult General Surgery consulted.  Dr. Kieth Brightly will consult   Final Clinical Impression(s) / ED Diagnoses Final diagnoses:  Abdominal distention  Hyperbilirubinemia    Rx / DC Orders ED Discharge Orders    None       Sidney Ace 02/02/21 1958    Pattricia Boss, MD 02/10/21 [phone]

## 2021-02-02 NOTE — Telephone Encounter (Signed)
Call received from the patients Mother, pt is in a lot of discomfort and mother states is more swollen, she wants her to have a paracentesis today with no ED visit. I explained to her that I would need to get with Dr Bryan Lemma or his nurse to see what can be done.

## 2021-02-02 NOTE — Telephone Encounter (Signed)
Reviewed recent VC's office note, recent blood work and recent imaging studies. Recommend ED evaluation.

## 2021-02-02 NOTE — ED Triage Notes (Signed)
Pt presents w/abd swelling and BLE. Pt was seen here 2 weeks ago for the same. Pt also reports ongoing elevated liver enzymes. Pt reports thoracentesis 2 weeks ago

## 2021-02-02 NOTE — Telephone Encounter (Signed)
Spoke with Delila Spence, RN she stated to send a note on to the DOD regarding possible paracentesis with out ED visit.

## 2021-02-02 NOTE — Telephone Encounter (Signed)
[name] called and said pt was prescribed lexapro from hospital for anxiety and said it didn't have refills and wanted to know if Dr Bryan Lemma could fill until she can see someone else for it. She has to go to the ER to have fluid drained and she is really anxious elsie baynes [phone]

## 2021-02-02 NOTE — Telephone Encounter (Signed)
Left message on voicemail to call office. Wanted to let mom know that Dr. Bryan Lemma has filled the Lexapro #90/1 on 01/22/21.

## 2021-02-03 ENCOUNTER — Encounter (HOSPITAL_COMMUNITY): Payer: Self-pay | Admitting: Student in an Organized Health Care Education/Training Program

## 2021-02-03 ENCOUNTER — Inpatient Hospital Stay (HOSPITAL_COMMUNITY): Payer: 59

## 2021-02-03 DIAGNOSIS — F419 Anxiety disorder, unspecified: Secondary | ICD-10-CM | POA: Insufficient documentation

## 2021-02-03 DIAGNOSIS — R140 Abdominal distension (gaseous): Secondary | ICD-10-CM | POA: Diagnosis not present

## 2021-02-03 DIAGNOSIS — R1010 Upper abdominal pain, unspecified: Secondary | ICD-10-CM

## 2021-02-03 DIAGNOSIS — K7460 Unspecified cirrhosis of liver: Secondary | ICD-10-CM | POA: Diagnosis not present

## 2021-02-03 DIAGNOSIS — R17 Unspecified jaundice: Secondary | ICD-10-CM

## 2021-02-03 HISTORY — PX: IR PARACENTESIS: IMG2679

## 2021-02-03 LAB — BODY FLUID CELL COUNT WITH DIFFERENTIAL
Eos, Fluid: 0 %
Lymphs, Fluid: 18 %
Monocyte-Macrophage-Serous Fluid: 79 % (ref 50–90)
Neutrophil Count, Fluid: 2 % (ref 0–25)
Other Cells, Fluid: 1 %
Total Nucleated Cell Count, Fluid: 106 cu mm (ref 0–1000)

## 2021-02-03 LAB — BASIC METABOLIC PANEL
Anion gap: 17 — ABNORMAL HIGH (ref 5–15)
BUN: <5 mg/dL — ABNORMAL LOW (ref 6–20)
CO2: 25 mmol/L (ref 22–32)
Calcium: 7.1 mg/dL — ABNORMAL LOW (ref 8.9–10.3)
Chloride: 90 mmol/L — ABNORMAL LOW (ref 98–111)
Creatinine, Ser: 0.64 mg/dL (ref 0.44–1.00)
GFR, Estimated: >60 mL/min (ref >60)
Glucose, Bld: 58 mg/dL — ABNORMAL LOW (ref 70–99)
Potassium: 4.2 mmol/L (ref 3.5–5.1)
Sodium: 132 mmol/L — ABNORMAL LOW (ref 135–145)

## 2021-02-03 LAB — HEPATIC FUNCTION PANEL
ALT: 95 U/L — ABNORMAL HIGH (ref 0–44)
AST: 424 U/L — ABNORMAL HIGH (ref 15–41)
Albumin: 1.8 g/dL — ABNORMAL LOW (ref 3.5–5.0)
Alkaline Phosphatase: 311 U/L — ABNORMAL HIGH (ref 38–126)
Bilirubin, Direct: 4.2 mg/dL — ABNORMAL HIGH (ref 0.0–0.2)
Indirect Bilirubin: 4.4 mg/dL — ABNORMAL HIGH (ref 0.3–0.9)
Total Bilirubin: 8.6 mg/dL — ABNORMAL HIGH (ref 0.3–1.2)
Total Protein: 4.9 g/dL — ABNORMAL LOW (ref 6.5–8.1)

## 2021-02-03 LAB — ALBUMIN, PLEURAL OR PERITONEAL FLUID: Albumin, Fluid: <1 g/dL

## 2021-02-03 LAB — CBC
HCT: 25.6 % — ABNORMAL LOW (ref 36.0–46.0)
Hemoglobin: 8.2 g/dL — ABNORMAL LOW (ref 12.0–15.0)
MCH: 37.3 pg — ABNORMAL HIGH (ref 26.0–34.0)
MCHC: 32 g/dL (ref 30.0–36.0)
MCV: 116.4 fL — ABNORMAL HIGH (ref 80.0–100.0)
Platelets: 185 10*3/uL (ref 150–400)
RBC: 2.2 MIL/uL — ABNORMAL LOW (ref 3.87–5.11)
RDW: 14.4 % (ref 11.5–15.5)
WBC: 9.9 10*3/uL (ref 4.0–10.5)
nRBC: 0 % (ref 0.0–0.2)

## 2021-02-03 MED ORDER — BOOST / RESOURCE BREEZE PO LIQD CUSTOM
1.0000 | Freq: Three times a day (TID) | ORAL | Status: DC
  Administered 2021-02-03 – 2021-02-04 (×3): 1 via ORAL

## 2021-02-03 MED ORDER — ENOXAPARIN SODIUM 40 MG/0.4ML IJ SOSY
40.0000 mg | PREFILLED_SYRINGE | INTRAMUSCULAR | Status: DC
  Administered 2021-02-03: 40 mg via SUBCUTANEOUS
  Filled 2021-02-03: qty 0.4

## 2021-02-03 MED ORDER — OXYCODONE HCL 5 MG PO TABS
2.5000 mg | ORAL_TABLET | Freq: Four times a day (QID) | ORAL | Status: DC | PRN
  Administered 2021-02-03 – 2021-02-04 (×2): 2.5 mg via ORAL
  Filled 2021-02-03 (×2): qty 1

## 2021-02-03 MED ORDER — LIDOCAINE HCL (PF) 1 % IJ SOLN
INTRAMUSCULAR | Status: AC | PRN
  Administered 2021-02-03: 10 mL

## 2021-02-03 MED ORDER — ACETAMINOPHEN 500 MG PO TABS
500.0000 mg | ORAL_TABLET | Freq: Once | ORAL | Status: AC | PRN
  Administered 2021-02-03: 500 mg via ORAL
  Filled 2021-02-03: qty 1

## 2021-02-03 MED ORDER — LIDOCAINE HCL (PF) 1 % IJ SOLN
INTRAMUSCULAR | Status: AC
  Filled 2021-02-03: qty 30

## 2021-02-03 MED ORDER — HYDROXYZINE HCL 25 MG PO TABS
50.0000 mg | ORAL_TABLET | Freq: Three times a day (TID) | ORAL | Status: DC
  Administered 2021-02-03 – 2021-02-04 (×4): 50 mg via ORAL
  Filled 2021-02-03 (×4): qty 2

## 2021-02-03 NOTE — ED Notes (Signed)
MD paged for clarification of diet order.

## 2021-02-03 NOTE — Consult Note (Signed)
Jeanette Elliott 1981-04-07  [id number].    Requesting MD: Dr. Dorcas Mcmurray Chief Complaint/Reason for Consult: biliary sludge, liver failure  HPI:  This is a 40 yo white female with a history of Sjogren's syndrome, depression/anxiety, who presented to the ED several weeks ago with worsening abdominal distention and discomfort.  She was found to have severe hepatic steatosis with ascites, elevated LFTs, and was admitted for further work up.  She underwent a liver biopsy that did not real a definite cause of her liver failure.  Her CT scan did not show evidence of gallstones, but of some biliary sludge.  She was evaluated by GI during that admission and has followed up Dr. Bryan Lemma.  She has actually at this point been referred to [facility] for further GI evaluation given the complexity of this case and need for further tertiary evaluation.    She presents back to the ED yesterday secondary to abdominal pain from pressure from her recurrent ascites.  She states most of her pain is "pressure" and more on the left side than the right side in the last week.  She has trouble eating due to "a space issue."  She admits to some nausea but never develops emesis.  She admits to BLE edema and was treated with some lasix.  She comes to the ED for a repeat paracentesis to help relieve her discomfort.  Her WBC is normal and her LFTs are once again noted to be elevated c/w her known liver disease.  We are asked to see her due to biliary sludge.  ROS: ROS: Please see HPI, otherwise all other systems have been reviewed and are negative.  Family History  Problem Relation Age of Onset  . Colon cancer Neg Hx   . Esophageal cancer Neg Hx     Past Medical History:  Diagnosis Date  . Asthma   . Elevated liver enzymes   . Jaundice   . Sjogren's syndrome [facility])     Past Surgical History:  Procedure Laterality Date  . IR THORACENTESIS ASP PLEURAL SPACE W/IMG GUIDE  01/16/2021  . SUBMANDIBULAR GLAND EXCISION    .  TONSILLECTOMY      Social History:  reports that she has never smoked. She has never used smokeless tobacco. She reports previous alcohol use. She reports previous drug use.  Allergies:  Allergies  Allergen Reactions  . Latex Itching and Rash    (Not in a hospital admission)    Physical Exam: Blood pressure 99/73, pulse (!) 108, temperature 98.4 F (36.9 C), resp. rate 17, SpO2 95 %. General: pleasant, some frail appearing white female who is laying in bed in NAD HEENT: head is normocephalic, atraumatic.  Sclera are icteric.  PERRL.  Ears and nose without any masses or lesions.  Mouth is pink and moist Heart: regular rhythm, but tachy.  Normal s1,s2. No obvious murmurs, gallops, or rubs noted.  Palpable radial and pedal pulses bilaterally Lungs: CTAB, no wheezes, rhonchi, or rales noted.  Respiratory effort nonlabored Abd: soft, mild tenderness greatest on left side, + fluid wave with distention, +BS, no masses, hernias.  No peritonitis. MS: all 4 extremities are symmetrical with no cyanosis, clubbing, but with +2 BLE pitting edema Skin: warm and dry with no masses, lesions, or rashes Neuro: Cranial nerves 2-12 grossly intact, sensation is normal throughout Psych: A&Ox3 with an appropriate affect.   Results for orders placed or performed during the hospital encounter of 02/02/21 (from the past 48 hour(s))  Comprehensive  metabolic panel     Status: Abnormal   Collection Time: 02/02/21  4:39 PM  Result Value Ref Range   Sodium 133 (L) 135 - 145 mmol/L   Potassium 3.2 (L) 3.5 - 5.1 mmol/L   Chloride 87 (L) 98 - 111 mmol/L   CO2 29 22 - 32 mmol/L   Glucose, Bld 85 70 - 99 mg/dL    Comment: Glucose reference range applies only to samples taken after fasting for at least 8 hours.   BUN <5 (L) 6 - 20 mg/dL   Creatinine, Ser 0.78 0.44 - 1.00 mg/dL   Calcium 8.0 (L) 8.9 - 10.3 mg/dL   Total Protein 6.1 (L) 6.5 - 8.1 g/dL   Albumin 2.4 (L) 3.5 - 5.0 g/dL   AST 519 (H) 15 - 41 U/L    ALT 120 (H) 0 - 44 U/L   Alkaline Phosphatase 416 (H) 38 - 126 U/L   Total Bilirubin 9.9 (H) 0.3 - 1.2 mg/dL   GFR, Estimated >60 >60 mL/min    Comment: (NOTE) Calculated using the CKD-EPI Creatinine Equation (2021)    Anion gap 17 (H) 5 - 15    Comment: Performed at [facility], [place] [address], [place], [place] [address]  Lipase, blood     Status: None   Collection Time: 02/02/21  4:39 PM  Result Value Ref Range   Lipase 46 11 - 51 U/L    Comment: Performed at [facility] [address], [place], [place] [address]  CBC with Differential     Status: Abnormal   Collection Time: 02/02/21  4:39 PM  Result Value Ref Range   WBC 10.4 4.0 - 10.5 K/uL   RBC 2.83 (L) 3.87 - 5.11 MIL/uL   Hemoglobin 10.5 (L) 12.0 - 15.0 g/dL   HCT 32.5 (L) 36.0 - 46.0 %   MCV 114.8 (H) 80.0 - 100.0 fL   MCH 37.1 (H) 26.0 - 34.0 pg   MCHC 32.3 30.0 - 36.0 g/dL   RDW 14.2 11.5 - 15.5 %   Platelets 247 150 - 400 K/uL   nRBC 0.0 0.0 - 0.2 %   Neutrophils Relative % 56 %   Neutro Abs 5.7 1.7 - 7.7 K/uL   Lymphocytes Relative 28 %   Lymphs Abs 2.9 0.7 - 4.0 K/uL   Monocytes Relative 14 %   Monocytes Absolute 1.5 (H) 0.1 - 1.0 K/uL   Eosinophils Relative 1 %   Eosinophils Absolute 0.1 0.0 - 0.5 K/uL   Basophils Relative 1 %   Basophils Absolute 0.1 0.0 - 0.1 K/uL   Immature Granulocytes 0 %   Abs Immature Granulocytes 0.04 0.00 - 0.07 K/uL    Comment: Performed at [facility], [place] [address] [place], [place] [address]  Type and screen [place]     Status: None   Collection Time: 02/02/21  8:00 PM  Result Value Ref Range   ABO/RH(D) O POS    Antibody Screen NEG    Sample Expiration      02/05/2021,2359 Performed at [facility], [place] [address], [place], [place] [address]   Ammonia     Status: None   Collection Time: 02/02/21  8:11 PM  Result Value Ref Range   Ammonia 24 9 - 35 umol/L    Comment: Performed at [facility], [place] [address]  [place], [place] [address]  Protime-INR     Status: Abnormal  Collection Time: 02/02/21  8:11 PM  Result Value Ref Range   Prothrombin Time 16.4 (H) 11.4 - 15.2 seconds   INR 1.3 (H) 0.8 - 1.2    Comment: (NOTE) INR goal varies based on device and disease states. Performed at [facility], [place] [address], [place], [place] [address]   ABO/Rh     Status: None   Collection Time: 02/02/21  8:11 PM  Result Value Ref Range   ABO/RH(D)      O POS Performed at [facility] [address], [place], [place] [address]   Resp Panel by RT-PCR (Flu A&B, Covid) Nasopharyngeal Swab     Status: None   Collection Time: 02/02/21  8:22 PM   Specimen: Nasopharyngeal Swab; Nasopharyngeal(NP) swabs in vial transport medium  Result Value Ref Range   SARS Coronavirus 2 by RT PCR NEGATIVE NEGATIVE    Comment: (NOTE) SARS-CoV-2 target nucleic acids are NOT DETECTED.  The SARS-CoV-2 RNA is generally detectable in upper respiratory specimens during the acute phase of infection. The lowest concentration of SARS-CoV-2 viral copies this assay can detect is 138 copies/mL. A negative result does not preclude SARS-Cov-2 infection and should not be used as the sole basis for treatment or other patient management decisions. A negative result may occur with  improper specimen collection/handling, submission of specimen other than nasopharyngeal swab, presence of viral mutation(s) within the areas targeted by this assay, and inadequate number of viral copies(<138 copies/mL). A negative result must be combined with clinical observations, patient history, and epidemiological information. The expected result is Negative.  Fact Sheet for Patients:  EntrepreneurPulse.com.au  Fact Sheet for Healthcare Providers:  IncredibleEmployment.be  This test is no t yet approved or cleared by the [place] FDA and  has been authorized for detection and/or diagnosis of SARS-CoV-2  by FDA under an Emergency Use Authorization (EUA). This EUA will remain  in effect (meaning this test can be used) for the duration of the COVID-19 declaration under Section 564(b)(1) of the Act, 21 U.S.C.section 360bbb-3(b)(1), unless the authorization is terminated  or revoked sooner.       Influenza A by PCR NEGATIVE NEGATIVE   Influenza B by PCR NEGATIVE NEGATIVE    Comment: (NOTE) The Xpert Xpress SARS-CoV-2/FLU/RSV plus assay is intended as an aid in the diagnosis of influenza from Nasopharyngeal swab specimens and should not be used as a sole basis for treatment. Nasal washings and aspirates are unacceptable for Xpert Xpress SARS-CoV-2/FLU/RSV testing.  Fact Sheet for Patients: EntrepreneurPulse.com.au  Fact Sheet for Healthcare Providers: IncredibleEmployment.be  This test is not yet approved or cleared by the [place] FDA and has been authorized for detection and/or diagnosis of SARS-CoV-2 by FDA under an Emergency Use Authorization (EUA). This EUA will remain in effect (meaning this test can be used) for the duration of the COVID-19 declaration under Section 564(b)(1) of the Act, 21 U.S.C. section 360bbb-3(b)(1), unless the authorization is terminated or revoked.  Performed at [facility], [place] [address], [place], [place] [address]   Basic metabolic panel     Status: Abnormal   Collection Time: 02/03/21  4:32 AM  Result Value Ref Range   Sodium 132 (L) 135 - 145 mmol/L   Potassium 4.2 3.5 - 5.1 mmol/L   Chloride 90 (L) 98 - 111 mmol/L   CO2 25 22 - 32 mmol/L   Glucose, Bld 58 (L) 70 - 99 mg/dL    Comment: Glucose reference range applies only  to samples taken after fasting for at least 8 hours.   BUN <5 (L) 6 - 20 mg/dL   Creatinine, Ser 0.64 0.44 - 1.00 mg/dL   Calcium 7.1 (L) 8.9 - 10.3 mg/dL   GFR, Estimated >60 >60 mL/min    Comment: (NOTE) Calculated using the CKD-EPI Creatinine Equation (2021)    Anion  gap 17 (H) 5 - 15    Comment: Performed at [facility] [address] [place], [place] [address]  CBC     Status: Abnormal   Collection Time: 02/03/21  4:32 AM  Result Value Ref Range   WBC 9.9 4.0 - 10.5 K/uL   RBC 2.20 (L) 3.87 - 5.11 MIL/uL   Hemoglobin 8.2 (L) 12.0 - 15.0 g/dL   HCT 25.6 (L) 36.0 - 46.0 %   MCV 116.4 (H) 80.0 - 100.0 fL   MCH 37.3 (H) 26.0 - 34.0 pg   MCHC 32.0 30.0 - 36.0 g/dL   RDW 14.4 11.5 - 15.5 %   Platelets 185 150 - 400 K/uL   nRBC 0.0 0.0 - 0.2 %    Comment: Performed at [facility], [place] [address], [place], [place] [address]  Hepatic function panel     Status: Abnormal   Collection Time: 02/03/21  7:31 AM  Result Value Ref Range   Total Protein 4.9 (L) 6.5 - 8.1 g/dL   Albumin 1.8 (L) 3.5 - 5.0 g/dL   AST 424 (H) 15 - 41 U/L   ALT 95 (H) 0 - 44 U/L   Alkaline Phosphatase 311 (H) 38 - 126 U/L   Total Bilirubin 8.6 (H) 0.3 - 1.2 mg/dL   Bilirubin, Direct 4.2 (H) 0.0 - 0.2 mg/dL   Indirect Bilirubin 4.4 (H) 0.3 - 0.9 mg/dL    Comment: Performed at [facility] [address] [place], [place] [address]   DG Chest Port 1 View  Result Date: 02/02/2021 CLINICAL DATA:  Swelling and edema. Chest pain and shortness of breath. EXAM: PORTABLE CHEST 1 VIEW COMPARISON:  01/16/2021 radiographs and CT FINDINGS: Heart is normal in size. Normal mediastinal contours. Small pleural effusions better appreciated on CT. Streaky basilar atelectasis. No pulmonary edema. No pneumothorax. No confluent consolidation. No acute osseous abnormalities are seen. IMPRESSION: Small pleural effusions and bibasilar atelectasis. Electronically Signed   By: Keith Rake M.D.   On: 02/02/2021 18:48      Assessment/Plan Liver failure - per primary service  Biliary sludge The patient has no symptoms related to her gallbladder.  Her LFTs and findings are not secondary to her gallbladder, but to her known liver disease.  She does not need any surgery for her  gallbladder.  If she were to ever develop cholecystitis and this could be proven, she would likely require a perc chole drain as her mortality risk with an intra-abdominal operation at this point would be very high.  No surgical needs.  We will sign off.   Henreitta Cea, [facility] 02/03/2021, 8:05 AM Please see Amion for pager number during day hours 7:00am-4:30pm or 7:00am -11:30am on weekends

## 2021-02-03 NOTE — ED Notes (Signed)
Attempted report x1. 

## 2021-02-03 NOTE — Progress Notes (Addendum)
Family Medicine Teaching Service Daily Progress Note Intern Pager: [phone]  Patient name: Jeanette Elliott Medical record number: [id number] Date of birth: [birth year] Age: 40 y.o. Gender: female  Primary Care Provider: Overton Mam, DO Consultants: GI, IR Code Status: FULL  Pt Overview and Major Events to Date:  5/23: Admitted 5/24: 2.4L Therapeutic Paracentesis   Assessment and Plan: Paige Smithis a 40 y.o.femalewho presented with abdominal pain and distension 2/2 ascites. PMH is significant forNASH, Vit B12 and iron deficiency anemia, GERD, PTSD/anxiety, HTN, LE swelling, Hep A.  Ascites  Cirrhosis  Patient with successful therapeutic paracentesis with 2.4L removed yesterday. Fluid without WBC or organisms and low neutrophil count thus she was not started on abx for SBP. Fluid albumin <1, she was started on protein drink supplementation by GI. AST 424>291, ALT 95>80 and total bili 8.6>8.2. Extensive workup thus far has been unrevealing and patient has been referred to [facility] Hepatology for further evaluation. Appreciate GI consult and assistance with trying to expedite her appointment with hepatology. Her persistent hypotension and tachycardia are likely from her decompensated cirrhosis. TSH was normal.  -GI following, appreciate recommendations -Continue spironolactone 50 mg, Lasix 20 mg daily -Diet 2 g Na daily  -Daily weights -Avoid NSAIDs and Tylenol  Hypokalemia Potassium 3.1 this AM. Recurrent hypokalemia. Takes 40 mEq at home.  -Replete with 40 mEq x2   Left 9th anterior rib fracture  Dx on 5/1 after fall. Patient continues to have mild pain to the area.  -Lidocaine patch qd -Oxy 2.5 mg q6h PRN moderate to severe pain (avoiding NSAIDs and Tylenol)  Macrocytic and Hx IDA  Hgb stable 8.2>8.1. Baseline appears to be around 8. Transfusion threshold is 7. Followed by hematology outapatient.  -Monitor with CBC -Continue folic acid supplementation  Anxiety  PTSD   Has increased anxiety being in hospital setting. Anxious to get home.  -Continue daily Lexapro 10 mg -Scheduled Vistaril 50 mg TID   GERD: chronic, stable Holding home omeprazole   Sjogrens: chronic, stable Home medication includes Naphazoline Hcl eye drops.    FEN/GI: 2g Na diet PPx: Lovenox   Status is: Inpatient   Remains inpatient appropriate because:Pending GI recs   Dispo: The patient is from: Home              Anticipated d/c is to: Home              Patient currently is not medically stable to d/c.   Difficult to place patient No   Subjective:  Patient feels much improved this morning after paracentesis yesterday. Feels like she is able to breathe better, move around more easily and doesn't feel as full. She is excited about the possibility of going home today.   Objective: Temp:  [98 F (36.7 C)-98.1 F (36.7 C)] 98.1 F (36.7 C) (05/24 2200) Pulse Rate:  [89-111] 89 (05/25 0339) Resp:  [14-20] 14 (05/25 0339) BP: (94-101)/(64-76) 96/64 (05/25 0339) SpO2:  [94 %-100 %] 94 % (05/25 0339) Weight:  [58.5 kg] 58.5 kg (05/25 0552) Physical Exam: General: Jaundiced, sclera icteric, awake, alert, in no distress, pleasant  Cardiovascular: RRR without murmur Respiratory: CTAB without wheezing/rhonchi/rales Abdomen: decreased fluid wave, able to palpate liver edge laterally at the level of umbilicus, mildly distended, no tenderness on palpation of all quadrants, no rebound/guarding Extremities: swelling much improved in b/l lower extremities, only trace-1+ edema at ankles  Laboratory: Recent Labs  Lab 02/02/21 1639 02/03/21 0432 02/04/21 0226  WBC 10.4 9.9 6.8  HGB 10.5*  8.2* 8.1*  HCT 32.5* 25.6* 25.0*  PLT 247 185 164   Recent Labs  Lab 02/02/21 1639 02/03/21 0432 02/03/21 0731 02/04/21 0226  NA 133* 132*  --  133*  K 3.2* 4.2  --  3.1*  CL 87* 90*  --  92*  CO2 29 25  --  31  BUN <5* <5*  --  <5*  CREATININE 0.78 0.64  --  0.67  CALCIUM 8.0*  7.1*  --  7.8*  PROT 6.1*  --  4.9* 4.5*  BILITOT 9.9*  --  8.6* 8.2*  ALKPHOS 416*  --  311* 291*  ALT 120*  --  95* 80*  AST 519*  --  424* 291*  GLUCOSE 85 58*  --  74    Imaging/Diagnostic Tests: IR Paracentesis  Result Date: 02/03/2021 INDICATION: Patient with history of Sjogren's disease, anemia, fatty liver, elevated liver function tests, ascites; request received for diagnostic and therapeutic paracentesis up to 5 liters. EXAM: ULTRASOUND GUIDED DIAGNOSTIC AND THERAPEUTIC PARACENTESIS MEDICATIONS: 1% lidocaine to skin and subcutaneous tissue COMPLICATIONS: None immediate. PROCEDURE: Informed written consent was obtained from the patient after a discussion of the risks, benefits and alternatives to treatment. A timeout was performed prior to the initiation of the procedure. Initial ultrasound scanning demonstrates a small to moderate amount of ascites within the left mid to lower abdominal quadrant. The left mid to lower abdomen was prepped and draped in the usual sterile fashion. 1% lidocaine was used for local anesthesia. Following this, a 19 gauge, 7-cm, Yueh catheter was introduced. An ultrasound image was saved for documentation purposes. The paracentesis was performed. The catheter was removed and a dressing was applied. The patient tolerated the procedure well without immediate post procedural complication. FINDINGS: A total of approximately 2.4 liters of hazy, amber fluid was removed. Samples were sent to the laboratory as requested by the clinical team. IMPRESSION: Successful ultrasound-guided diagnostic and therapeutic paracentesis yielding 2.4 liters of peritoneal fluid. Read by: Jeananne Rama, PA-C Electronically Signed   By: Gilmer Mor D.O.   On: 02/03/2021 11:23     Sabino Dick, DO 02/04/2021, 6:50 AM PGY-1, [facility] FPTS Intern pager: [phone], text pages welcome

## 2021-02-03 NOTE — Discharge Summary (Addendum)
Family Medicine Teaching [facility] Discharge Summary  Patient name: Jeanette Elliott Medical record number: [id number] Date of birth: 21-Mar-1981 Age: 40 y.o. Gender: female Date of Admission: 02/02/2021  Date of Discharge: 02/04/21 Admitting Physician: Leeroy Bock, DO  Primary Care Provider: Overton Mam, DO Consultants: Gastroenterology, Interventional Radiology   Indication for Hospitalization: Ascites  Discharge Diagnoses/Problem List:  Active Problems:   Cirrhosis (HCC) Ascites Macrocytic anemia Anxiety  Left 9th Anterior Rib Fracture  Disposition: Home  Discharge Condition: Stable  Discharge Exam:  Blood pressure 96/64, pulse 89, temperature 98.1 F (36.7 C), temperature source Oral, resp. rate 14, weight 58.5 kg, SpO2 94 %. General: Jaundiced, sclera icteric, awake, alert, in no distress, pleasant  Cardiovascular: RRR without murmur Respiratory: CTAB without wheezing/rhonchi/rales Abdomen: decreased fluid wave, able to palpate liver edge laterally at the level of umbilicus, mildly distended, no tenderness on palpation of all quadrants, no rebound/guarding Extremities: swelling much improved in b/l lower extremities, only trace-1+ edema at ankles  Brief Hospital Course:  Jeanette Elliott is a 40 y.o. female presenting with abdominal pain. PMH is significant for cirrhosis of unknown etiology, macrocytic anemia and iron deficiency anemia, GERD, PTSD/anxiety, HTN, hepatitis A. Below is a brief summary of her hospital course:   Ascites  Cirrhosis  Patient presented with abdominal pain 2/2 ascites. She has cirrhosis of unknown etiology with first diagnosis last admission 5/1. She has been referred to [facility] Hepatology for further evaluation by GI specialist here. 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  MCV 117 - HIV negative, acute viral hepatitis panel negative - INR 1.3 - Pending labs include: thiamine, liver biopsy result - Heavy metals negative - Liver biopsy completed 01/14/2021: showed marked cholestatic injury with significant cholestasis and lobular hepatocytes and mild to moderate canalicular cholestasis. The findings are not entirely specific. - Abdominal US (12/18/2020): GB sludge, 6 mm GB polyp, hepatic steatosis - MRCP (01/11/2021): Enlarged, heterogenous appearance liver 25 cm, CBD 5 mm without any intrahepatic duct dilation.  No CDL.  GB sludge with small stones without cholecystitis.  Normal pancreas.  - CT chest (01/15/2021): Moderate to large bilateral pleural effusions with associated lower lobe consolidation, new from prior.  No mass. - CT abdomen/pelvis (01/15/2021): Hepatomegaly, severe hepatic steatosis, moderate volume ascites, new from prior. - Abdominal ultrasound for paracentesis (01/16/2021): Trace perihepatic ascites without safe window for paracentesis - Left thoracentesis (01/16/2021): 300 cc pleural fluid removed  Metabolic storage disorders have not yet been fully evaluated.   This hospitalization she had paracentesis with 2.4L removed by IR on 5/24. Fluid without WBC or organisms seen. Fluid cloudy appearing with only 2% neutrophil count thus no antibiotics were started for SBP treatment or prophylaxis.  Patient was continued on her home Lasix and Spironolactone, her blood pressures were soft (around 90's systolic) and did not allow for further increase. She was kept on a low-sodium diet.  Patient evaluated by GI on admission and they were able to talk with [facility] Hepatologist who will arrange for closer outpatient follow up in the next few weeks. They recommended 24-hour urine copper collection which her GI physician will set up outpatient. Spironolactone increased to 100 mg and Lasix 40 mg daily on  discharge.  Macrocytic and Iron Deficiency Anemia Previous admission she  was seen by inpatient Hematology service who felt this was not Hemochromatosis based on the prior history of iron deficiency anemia.  Started on folic acid for macrocytic anemia at that hospitiaization Hemoglobin remained stable throughout admission (around 8, which appears to be her baseline). At this admission, she was continued on folate supplementation given deficiency noted on recent admission. She did not have blood transfusion.   Hypotension  Tachycardia Persistently hypotensive (90's-100's systolic over 60's-70's diastolic) and tachycardic (to 628'B) while admitted. Likely from her decompensated cirrhosis.   Anxiety  PTSD  She was kept on her home Lexapro and Vistaril was changed from PRN to TID scheduled given her increased anxiety while inpatient.  Other conditions chronic and stable.  Follow up recommendations 1. Should follow up with [facility]. They will call her for a follow up in the next few weeks.  2. Continue to counsel on importance of medication adherence, abstinence from EtOH and <2g Na diet.  3. Recommend follow up BMP to evaluate for hypokalemia.  4. Avoid Tylenol/NSAIDs 5. Dr. Barron Alvine (GI) to arrange outpatient 24-hour urine collection for copper.  6. Repeat CMP in 1-week.  7. Will need outpatient upper endoscopy for variceal screening.  8. Spironolactone increased to 100 mg daily from 50 mg. Lasix increased to 40 mg daily from 20 mg.   Significant Procedures:  5/24 IR Paracentesis: 2.4L fluid removed    Significant Labs and Imaging:  Recent Labs  Lab 02/02/21 1639 02/03/21 0432 02/04/21 0226  WBC 10.4 9.9 6.8  HGB 10.5* 8.2* 8.1*  HCT 32.5* 25.6* 25.0*  PLT 247 185 164   Recent Labs  Lab 02/02/21 1639 02/03/21 0432 02/03/21 0731 02/04/21 0226  NA 133* 132*  --  133*  K 3.2* 4.2  --  3.1*  CL 87* 90*  --  92*  CO2 29 25  --  31  GLUCOSE 85 58*  --  74  BUN <5*  <5*  --  <5*  CREATININE 0.78 0.64  --  0.67  CALCIUM 8.0* 7.1*  --  7.8*  ALKPHOS 416*  --  311* 291*  AST 519*  --  424* 291*  ALT 120*  --  95* 80*  ALBUMIN 2.4*  --  1.8* 1.7*    Results/Tests Pending at Time of Discharge:   Discharge Medications:  Allergies as of 02/04/2021      Reactions   Latex Itching, Rash      Medication List    STOP taking these medications   acetaminophen 325 MG tablet Commonly known as: TYLENOL     TAKE these medications   Adult One Daily Gummies Chew Chew 2 tablets by mouth daily.   albuterol 108 (90 Base) MCG/ACT inhaler Commonly known as: VENTOLIN HFA Inhale 2 puffs into the lungs every 6 (six) hours as needed (asthma attacks).   CLEAR EYES OP Place 1 drop into both eyes 2 (two) times daily.   EPINEPHrine 0.3 mg/0.3 mL Soaj injection Commonly known as: EPI-PEN Inject 0.3 mg into the muscle once as needed for anaphylaxis.   escitalopram 10 MG tablet Commonly known as: LEXAPRO Take 1 tablet (10 mg total) by mouth daily.   folic acid 1 MG tablet Commonly known as: FOLVITE Take 1 tablet (1 mg total) by mouth daily.   furosemide 40 MG tablet Commonly known as: LASIX Take 1 tablet (40 mg total) by mouth daily. What changed:   medication strength  how much to take   hydrOXYzine 25 MG tablet Commonly  known as: ATARAX/VISTARIL Take 2 tablets (50 mg total) by mouth at bedtime.   oxyCODONE 5 MG immediate release tablet Commonly known as: Oxy IR/ROXICODONE Take 1/2 tablet (2.5 mg total) by mouth every 8 (eight) hours as needed for up to 7 days for moderate pain or severe pain.   potassium chloride SA 20 MEQ tablet Commonly known as: KLOR-CON Take 2 tablets (40 mEq total) by mouth daily.   spironolactone 100 MG tablet Commonly known as: Aldactone Take 1 tablet (100 mg total) by mouth daily. What changed:   medication strength  how much to take  when to take this       Discharge Instructions: Please refer to Patient  Instructions section of EMR for full details.  Patient was counseled important signs and symptoms that should prompt return to medical care, changes in medications, dietary instructions, activity restrictions, and follow up appointments.   Follow-Up Appointments:  Follow-up Information    Overton Mam, DO Follow up.   Specialty: Family Medicine Contact information: [address] [place] [address] [phone]        Darcus Austin, PA-C. Go on 03/27/2021.   Specialty: Physician Assistant Why: Follow up at scheduled appointment at 10:30AM.  They should call you for an earlier appointment. Contact information: [address] MEDICINE [place], [address] [place] [address] [phone]               Sabino Dick, DO 02/04/2021, 2:49 PM PGY-1, [facility]

## 2021-02-03 NOTE — Procedures (Signed)
Ultrasound-guided diagnostic and therapeutic paracentesis performed yielding 2.4 liters of hazy, amber colored fluid. No immediate complications.The fluid was sent to the lab for preordered studies. EBL none.

## 2021-02-03 NOTE — Consult Note (Addendum)
Referring Provider:  [facility]         Primary Care Physician:  Ronnald Nian, DO Primary Gastroenterologist: Gerrit Heck, MD             We were asked to see this patient for:   Abnormal liver test               ASSESSMENT / PLAN:    #40 year old female with elevated liver test/weight loss/edema all developing over last few months.  Extensive, unrevealing serologic and radiographic work-up during recent hospital admission as discussed below .  She has iron overload but without evidence of  Hemochromatosis.  No evidence for AIH, Wilson's disease, viral hepatitis, PBC, PSC, etc..  Liver biopsy obtained and sent to [facility] pathology but findings were not specific. DDx diagnosis is broad ( effects of medications/drugs/herbal remedies, Wilson disease, other genetic metabolic disorders). See full report below. We saw her for hospital follow-up on 01/22/2021 and referred her to [facility]. Due to abdominal distention/discomfort patient presented to the ED yesterday. Liver tests slightly worse compared to a week ago.  They have improved some overnight ( got IV fluids).  INR stable at 1.3.  Clear mentation.  -Patient asking to go home. She has consultation with [facility] but that is not until mid July, It may make since to try and transfer her to [place] even though she wants to go home. Will discuss with Dr. Loletha Carrow   # Chronic macrocytic anemia. Hgb 8.2 which is stable compared to labs earlier this month  Upper abdominal pain and early satiety from ascites  Severe protein calorie malnutrition.    HPI:                                                                                                                             Chief Complaint: Abdominal discomfort and distention  Jeanette Elliott is a 40 y.o. female with a past medical history significant for history of Sjogren's disease, longstanding iron deficiency anemia, PTSD, anxiety  We saw the patient in hospital  consultation 5/2 for elevated liver test/jaundice/weight loss.  Her problems had begun a few months earlier.  Urgent care had referred her to Dr. Maretta Los at [facility] ( GI / Hepatology).  MRCP was ordered but in the meantime patient was trying to get healthcare insurance.  When she did obtain insurance, [facility] was not in network. She transitioned her care to [place], had an appointment on 5/12 but ended up in the hospital on 5/ 2 for worsening abdominal pain and distention/jaundice.  She had an extensive inpatient work-up.Summary of work-up from Dr. 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 MCV 117 -HIV negative, acute viral hepatitis panel negative -INR 1.3 -Pending labs include: thiamine, liver biopsy  result - Heavy metals negative -Liver biopsy completed 01/14/2021:[facility]   -Abdominal US (12/18/2020): GB sludge, 6 mm GB polyp, hepatic steatosis -MRCP(01/11/2021): Enlarged, heterogenous appearance liver 25 cm, CBD 5 mm without any intrahepatic duct dilation. No CDL. GB sludge with small stones without cholecystitis. Normal pancreas.  -CT chest (01/15/2021): Moderate to large bilateral pleural effusions with associated lower lobe consolidation, new from prior. No mass. - CT abdomen/pelvis (01/15/2021): Hepatomegaly, severe hepatic steatosis, moderate volume ascites, new from prior. - Abdominal ultrasound for paracentesis (01/16/2021): Trace perihepatic ascites without safe window for paracentesis - Left thoracentesis (01/16/2021): 300 cc pleural fluid removed -Seen by inpatient Hematology service.  Felt this was not Hemochromatosis based on the prior history of iron deficiency anemia.  Started on folic acid for macrocytic anemia.  No  blood transfusion needed - Seen by inpatient Psychiatry service for severe anxiety, PTSD and increased Vistaril and started on Lexapro   Patient had a GI office follow-up with Dr. Bryan Lemma on 01/22/21.  Her main complaint at that time was edema and abdominal fullness.  Her PCP had increased her diuretics earlier that day.  Extensive, inpatient work-up earlier this month was unrevealing for an etiology of elevated liver tests.  We referred her to [facility] hepatology for further evaluation.  She was advised to follow-up in the pulmonary clinic as well as with hematology.  She was having blurry vision so referral to ophthalmology was made.     A. Outside consult, [phone], [facility], [place], [place]. Date of procedure 01/14/21:  Liver, site not specified, needle core biopsy: Liver with marked cholestatic injury, mixed microvesicular and macrovesicular steatosis and hepatocellular ballooning. See comment and microscopic description. There is a prominent population of enlarged hepatocytes with foamy cytoplasm present. Patchy mild hepatocellular iron (grade 2+ of 4). Bridging fibrosis with extensive pericellular fibrosis.  Comment: This is a challenging case. There is marked cholestatic injury with significant cholate stasis in lobular hepatocytes and mild to moderate canalicular cholestasis in a background of mixed microvesicular and macrovesicular steatosis. In addition, there is a population of enlarged hepatocytes with foamy cytoplasm that may represent additional areas of prominent microvesicular steatosis, or could potentially represent evidence of abnormal deposition of glycogen or lipid etc..., as could  be seen in a genetic metabolic process. There is no significant portal, lobular or interface inflammation to suggest a chronic hepatitis (ie autoimmune hepatitis or viral etiologies). The pattern of the histologic findings is not typical of non-alcoholic fatty liver disease.    Overall, the findings are not entirely specific; the differential diagnosis is broad and includes the effects of medications/drugs/herbal remedies, Wilson disease, other genetic metabolic disorders (ie disorders of glycogen or lipid metabolism, mitochondrial disorders etc...), and the toxic effects of ethanol. Clinical correlation with additional ancillary testing to exclude underlying genetic conditions (including quantitative copper for Wilson disease) is recommended.   INTERVAL HISTORY:  She seen at [facility] ED on 5/20 but told there that she was not likely see a  Hepatologist in ED. Patient eventually left and no labs or imaging was done.She is awaiting outpatient consultation with [facility] which is not until mid July  She presents to [facility] ED yesterday with abdominal discomfort/distention.  In ED yesterday her liver test were slightly worse compared to a week ago.  After IV fluids her liver test is slightly improved overnight.  She complains of generalized abdominal discomfort from abdominal distention.  She has occasional nausea.  She has been taking Lasix 40  mg daily in addition to 50 mg of Aldactone.  She reports compliance with low-sodium diet . Patient actually wants to go home.    PREVIOUS ENDOSCOPIC EVALUATIONS / PERTINENT STUDIES   NONE    Past Medical History:  Diagnosis Date  . Asthma   . Elevated liver enzymes   . Jaundice   . Sjogren's syndrome [facility]     Past Surgical History:  Procedure Laterality Date  . IR THORACENTESIS ASP PLEURAL SPACE W/IMG GUIDE  01/16/2021  . SUBMANDIBULAR GLAND EXCISION    . TONSILLECTOMY      Prior to Admission medications   Medication Sig Start Date End Date Taking? Authorizing Provider  acetaminophen (TYLENOL) 325 MG tablet Take 1 tablet (325 mg total) by mouth every 6 (six) hours as needed for mild pain. Patient taking differently: Take 325 mg by mouth 2 (two) times daily as needed for mild pain. 01/16/21  Yes Espinoza, Dawson Bills, DO   albuterol (VENTOLIN HFA) 108 (90 Base) MCG/ACT inhaler Inhale 2 puffs into the lungs every 6 (six) hours as needed (asthma attacks). 01/22/21  Yes Cirigliano, Mary K, DO  EPINEPHrine 0.3 mg/0.3 mL IJ SOAJ injection Inject 0.3 mg into the muscle once as needed for anaphylaxis. 01/22/21  Yes Cirigliano, Mary K, DO  escitalopram (LEXAPRO) 10 MG tablet Take 1 tablet (10 mg total) by mouth daily. 01/22/21  Yes Cirigliano, Garvin Fila, DO  folic acid (FOLVITE) 1 MG tablet Take 1 tablet (1 mg total) by mouth daily. 01/17/21  Yes Sharion Settler, DO  furosemide (LASIX) 20 MG tablet Take 1 tablet (20 mg total) by mouth daily. 01/22/21  Yes Cirigliano, Mary K, DO  hydrOXYzine (ATARAX/VISTARIL) 25 MG tablet Take 2 tablets (50 mg total) by mouth at bedtime. 01/22/21  Yes Cirigliano, Mary K, DO  Multiple Vitamins-Minerals (ADULT ONE DAILY GUMMIES) CHEW Chew 2 tablets by mouth daily.   Yes [provider]  Naphazoline HCl (CLEAR EYES OP) Place 1 drop into both eyes 2 (two) times daily.   Yes [provider]  potassium chloride SA (KLOR-CON) 20 MEQ tablet Take 2 tablets (40 mEq total) by mouth daily. 01/27/21  Yes Nche, Charlene Brooke, NP  spironolactone (ALDACTONE) 50 MG tablet Take 1 tablet (50 mg total) by mouth every morning. 01/22/21  Yes Cirigliano, Garvin Fila, DO    Current Facility-Administered Medications  Medication Dose Route Frequency Provider Last Rate Last Admin  . enoxaparin (LOVENOX) injection 40 mg  40 mg Subcutaneous Q24H Anderson, Chelsey L, DO   40 mg at 02/02/21 2202  . escitalopram (LEXAPRO) tablet 10 mg  10 mg Oral Daily Anderson, Chelsey L, DO      . folic acid (FOLVITE) tablet 1 mg  1 mg Oral Daily Anderson, Chelsey L, DO   1 mg at 02/03/21 0017  . furosemide (LASIX) tablet 20 mg  20 mg Oral Daily Anderson, Chelsey L, DO      . hydrOXYzine (ATARAX/VISTARIL) tablet 50 mg  50 mg Oral TID PRN Ouida Sills, Chelsey L, DO   50 mg at 02/03/21 2409  . spironolactone (ALDACTONE) tablet 50 mg   50 mg Oral q morning Doristine Mango L, DO       Current Outpatient Medications  Medication Sig Dispense Refill  . acetaminophen (TYLENOL) 325 MG tablet Take 1 tablet (325 mg total) by mouth every 6 (six) hours as needed for mild pain. (Patient taking differently: Take 325 mg by mouth 2 (two) times daily as needed for mild pain.) 30 tablet 0  .  albuterol (VENTOLIN HFA) 108 (90 Base) MCG/ACT inhaler Inhale 2 puffs into the lungs every 6 (six) hours as needed (asthma attacks). 8 g 1  . EPINEPHrine 0.3 mg/0.3 mL IJ SOAJ injection Inject 0.3 mg into the muscle once as needed for anaphylaxis. 1 each 0  . escitalopram (LEXAPRO) 10 MG tablet Take 1 tablet (10 mg total) by mouth daily. 90 tablet 1  . folic acid (FOLVITE) 1 MG tablet Take 1 tablet (1 mg total) by mouth daily. 30 tablet 0  . furosemide (LASIX) 20 MG tablet Take 1 tablet (20 mg total) by mouth daily. 60 tablet 1  . hydrOXYzine (ATARAX/VISTARIL) 25 MG tablet Take 2 tablets (50 mg total) by mouth at bedtime. 90 tablet 0  . Multiple Vitamins-Minerals (ADULT ONE DAILY GUMMIES) CHEW Chew 2 tablets by mouth daily.    . Naphazoline HCl (CLEAR EYES OP) Place 1 drop into both eyes 2 (two) times daily.    . potassium chloride SA (KLOR-CON) 20 MEQ tablet Take 2 tablets (40 mEq total) by mouth daily. 14 tablet 0  . spironolactone (ALDACTONE) 50 MG tablet Take 1 tablet (50 mg total) by mouth every morning. 90 tablet 0    Allergies as of 02/02/2021 - Review Complete 02/02/2021  Allergen Reaction Noted  . Latex Itching and Rash 10/10/2019    Family History  Problem Relation Age of Onset  . Colon cancer Neg Hx   . Esophageal cancer Neg Hx     Social History   Socioeconomic History  . Marital status: Divorced    Spouse name: Not on file  . Number of children: Not on file  . Years of education: Not on file  . Highest education level: Not on file  Occupational History  . Not on file  Tobacco Use  . Smoking status: Never Smoker  .  Smokeless tobacco: Never Used  Vaping Use  . Vaping Use: Never used  Substance and Sexual Activity  . Alcohol use: Not Currently  . Drug use: Not Currently  . Sexual activity: Not on file  Other Topics Concern  . Not on file  Social History Narrative  . Not on file   Social Determinants of Health   Financial Resource Strain: Not on file  Food Insecurity: Not on file  Transportation Needs: Not on file  Physical Activity: Not on file  Stress: Not on file  Social Connections: Not on file  Intimate Partner Violence: Not on file    Review of Systems: (Attending addendum: She reports some tingling in her feet but no other neurologic symptoms.  Family states she has not had episodes of slurred speech or confusion at home).  All systems reviewed and negative except where noted in HPI.   OBJECTIVE:    Physical Exam: Vital signs in last 24 hours: Temp:  [98.4 F (36.9 C)] 98.4 F (36.9 C) (05/23 1630) Pulse Rate:  [95-121] 108 (05/24 0645) Resp:  [11-25] 17 (05/24 0645) BP: (94-104)/(65-82) 99/73 (05/24 0645) SpO2:  [94 %-100 %] 95 % (05/24 0645)   General:   Alert  Chronically ill appearing feale in NAD. Deeply jaundiced Psych:  Pleasant, cooperative. Normal mood and affect. Eyes:  Pupils equal, +  icterus.   Ears:  Normal auditory acuity. Nose:  No deformity, discharge,  or lesions. Neck:  Supple; no masses Lungs:  Clear throughout to auscultation.   No wheezes, crackles, or rhonchi.  Heart:  Regular rate and rhythm; no murmurs, 2+ bilateral pedal edema Abdomen:  Soft, moderately  distended, mild to moderate diffuse tenderness .  BS active, no palp mass   Rectal:  Deferred  Msk:  Symmetrical without gross deformities. . Neurologic:  Alert and  oriented x4;  grossly normal neurologically.  No asterixis (Attending addendum: In addition to jaundice, she has multiple spider nevi on the head neck and upper chest wall. Speech is fluent, depressed affect, normal gross motor  function, no asterixis).  There were no vitals filed for this visit.   Scheduled inpatient medications . enoxaparin (LOVENOX) injection  40 mg Subcutaneous Q24H  . escitalopram  10 mg Oral Daily  . folic acid  1 mg Oral Daily  . furosemide  20 mg Oral Daily  . spironolactone  50 mg Oral q morning      Intake/Output from previous day: 05/23 0701 - 05/24 0700 In: 409.1 [IV Piggyback:409.1] Out: -  Intake/Output this shift: No intake/output data recorded.   Lab Results: Recent Labs    02/02/21 1639 02/03/21 0432  WBC 10.4 9.9  HGB 10.5* 8.2*  HCT 32.5* 25.6*  PLT 247 185   BMET Recent Labs    02/02/21 1639 02/03/21 0432  NA 133* 132*  K 3.2* 4.2  CL 87* 90*  CO2 29 25  GLUCOSE 85 58*  BUN <5* <5*  CREATININE 0.78 0.64  CALCIUM 8.0* 7.1*   LFT Recent Labs    02/03/21 0731  PROT 4.9*  ALBUMIN 1.8*  AST 424*  ALT 95*  ALKPHOS 311*  BILITOT 8.6*  BILIDIR 4.2*  IBILI 4.4*   PT/INR Recent Labs    02/02/21 2011  LABPROT 16.4*  INR 1.3*   Hepatitis Panel No results for input(s): HEPBSAG, HCVAB, HEPAIGM, HEPBIGM in the last 72 hours.   . CBC Latest Ref Rng & Units 02/03/2021 02/02/2021 01/27/2021  WBC 4.0 - 10.5 K/uL 9.9 10.4 10.3  Hemoglobin 12.0 - 15.0 g/dL 8.2(L) 10.5(L) 9.8(L)  Hematocrit 36.0 - 46.0 % 25.6(L) 32.5(L) 28.6(L)  Platelets 150 - 400 K/uL 185 247 275.0    . CMP Latest Ref Rng & Units 02/03/2021 02/02/2021 01/28/2021  Glucose 70 - 99 mg/dL 58(L) 85 89  BUN 6 - 20 mg/dL <5(L) <5(L) 3(L)  Creatinine 0.44 - 1.00 mg/dL 0.64 0.78 0.64  Sodium 135 - 145 mmol/L 132(L) 133(L) 135  Potassium 3.5 - 5.1 mmol/L 4.2 3.2(L) 3.2(L)  Chloride 98 - 111 mmol/L 90(L) 87(L) 90(L)  CO2 22 - 32 mmol/L 25 29 32  Calcium 8.9 - 10.3 mg/dL 7.1(L) 8.0(L) 8.3(L)  Total Protein 6.5 - 8.1 g/dL 4.9(L) 6.1(L) -  Total Bilirubin 0.3 - 1.2 mg/dL 8.6(H) 9.9(H) -  Alkaline Phos 38 - 126 U/L 311(H) 416(H) -  AST 15 - 41 U/L 424(H) 519(H) -  ALT 0 - 44 U/L 95(H)  120(H) -   Studies/Results: DG Chest Port 1 View  Result Date: 02/02/2021 CLINICAL DATA:  Swelling and edema. Chest pain and shortness of breath. EXAM: PORTABLE CHEST 1 VIEW COMPARISON:  01/16/2021 radiographs and CT FINDINGS: Heart is normal in size. Normal mediastinal contours. Small pleural effusions better appreciated on CT. Streaky basilar atelectasis. No pulmonary edema. No pneumothorax. No confluent consolidation. No acute osseous abnormalities are seen. IMPRESSION: Small pleural effusions and bibasilar atelectasis. Electronically Signed   By: Keith Rake M.D.   On: 02/02/2021 18:48    Active Problems:   Cirrhosis ([place])    Tye Savoy, NP-C @  02/03/2021, 8:49 AM  I have reviewed the entire case in detail with the above APP  and discussed the plan in detail.  Therefore, I agree with the diagnoses recorded above. In addition,  I have personally interviewed and examined the patient. Her parents were present at the bedside during my visit.  My additional thoughts are as follows:  I performed an extensive chart review, discussed the case with my partner Dr. Bryan Lemma and with Dr. Melina Copa of pathology regarding the patient's recent liver biopsy.  This remains a puzzling case of chronic liver disease of unknown cause causing cholestatic jaundice and mild coagulopathy.  Fortunately, her liver chemistries and INR remain stable despite some mild fluctuations in the bilirubin.  She came here today mainly for abdominal distention with feeling of pressure and early satiety from the ascites, and has had some relief after a 2.4 L therapeutic paracentesis today.  Albumin is predictably low in that ascites specimen and cell count rules out SBP. Her severe serum hypoalbuminemia is also worrisome and indicative of the severity and chronicity of his liver disease.  It is also affected her appetite in recent weeks and she is at risk for greater protein calorie malnutrition going forward.  (For that  reason, I ordered some boost/Ensure protein calorie supplements).  While she does need a 2000 mg daily sodium restriction to help control fluid overload, she does not need any other specific dietary restrictions.  Liver biopsy did not give a clear answer even after review by a [facility] pathologist.  There was some suggestion on about possible glycogen storage disease or Wilson's disease.  Serum copper and ceruloplasmin were normal. No hepatic copper concentration was done on that biopsy specimen, but I have learned from Dr. Melina Copa of our own pathology department that there are only a few pathology laboratories in the country that perform it.  The remaining specimen would likely not be sufficient to have that done, so I have asked them not to send the tissue out until have a chance to speak with a hepatologist at [facility] about the case.  Coral is anxious to go home, and I can certainly understand that.  Again, her liver chemistries and INR are stable so she is certainly not in acute hepatic failure.  She is on low-dose diuretics, and repeat outpatient therapeutic paracentesis could be performed as needed. Before she goes home, I hope to be able to speak with a hepatologist at [facility] to discuss the case and see if we can get her seen sooner than the currently scheduled appointment with them in July. If I am able to speak with one of those physicians, they might also direct [place] to any further testing we should do in the meantime.  I particularly wonder if genetic testing of some kind would be the next step to rule out Wilson's disease.  Jamisyn was agreeable to staying overnight observation until tomorrow to make sure her clinical status is stable, and I hope that may be able to communicate with a hepatologist before she goes home.  If not, I will communicate with Dr. Bryan Lemma, and I am sure he will make every effort to do so as soon as possible in hopes of getting this patient evaluated by a hepatologist  soon.  Lastly, this patient still has left-sided chest wall pain from her recent rib fracture sustained during a fall earlier this month.  While low-dose Tylenol she has been taking is not likely contributing to her worsening her current liver disease, I think will be best to eliminate that altogether.  She does not have a history of alcohol  abuse, so I recommend judicious use of low-dose opiates such as oxycodone for the next couple weeks until the fracture heals.  Nelida Meuse III   Office:[phone]

## 2021-02-03 NOTE — ED Notes (Signed)
Secretary notified of diet order.

## 2021-02-03 NOTE — Progress Notes (Addendum)
Family Medicine Teaching Service Daily Progress Note Intern Pager: [phone]  Patient name: Jeanette Elliott Medical record number: [id number] Date of birth: 1981/07/02 Age: 40 y.o. Gender: female  Primary Care Provider: Ronnald Nian, DO Consultants: IR Code Status: FULL  Pt Overview and Major Events to Date:  5/23: Admitted   Assessment and Plan: Jeanette Elliott is a 40 y.o. female who presented with abdominal pain and distension 2/2 ascites. PMH is significant for NASH, Vit B12 and iron deficiency anemia, GERD, PTSD/anxiety, HTN, LE swelling, Hep A.  Ascites  Cirrhosis  Paracentesis was unable to be performed in ED. IR has been consulted for paracentesis this morning. Patient is tachycardic with soft blood pressures (44'R systolic). BP's make it hard to increase diuretics. Liver bx result from previous admission demonstrate marked cholestatic injury with significant cholestasis and microvesicular and macrovesicular steatosis. Genetic metabolic disorders have not yet been fully explored. I believe that patient would benefit from transfer to [facility] for further evaluation. She was referred there but her appointment is not until July. Will reach out to GI team here for their recommendations. WBC remains normal. PT/INR are increased, as would be expected. Hyponatremic at 132. MELD-Na score of 22, indicates 7-10% estimated 90 day mortality.  -IR consulted for paracentesis -Discuss with GI team re: recommendations and if they advise transfer to [facility] abx until paracentesis is collected -Continue spironolactone 50 mg, Lasix 20 mg  -Diet: <2g Na daily  -Daily weights -Avoid NSAIDs -Oxy IR 2.5 mg q6h PRN moderate to severe pain  -Repeat CT if no safe window for paracentesis  Hypokalemia: resolved Potassium 4.2 this AM. Patient with critical potassium of 2.7 in the outpatient setting on 5/17. Will continue to monitor with BMP and replete as necessary.  -Daily BMP  Macrocytic  and Iron Deficiency Anemia Hgb 10.5>8.2. MCV 116.4 this morning. Baseline Hgb around 8. Transfusion threshold 7. Patient is followed by Hematology outpatient.  -Monitor CBC  -Continue folic acid supplementation  Hypotension  BP's ranging 94-104/65-82. Likely in the setting of decompensated cirrhosis.  -Avoid ACE-I/ARB/propranolol in setting of cirrhosis -Monitor BP's  Tachycardia: Chronic, stable HR ranging 94-121. Appears to be chronic, unknown etiology. Anxiety could also be contributing.  -Assess TSH tomorrow if still here; could not add on to labs today -EKG pending  Anxiety  PTSD: Medication-controlled On Lexapro 10 mg and Vistaril 50 mg TID PRN. Has increased anxiety now, will schedule Vistaril. -Vistaril 50 mg TID  -Continue Lexapro 10 mg   GERD: Chronic, stable -Holding home omeprazole in setting of ascites  Sjogrens: Chronic, stable Home medication includes Naphazoline Hcl eye drops.   FEN/GI: NPO until paracentesis   PPx: Lovenox   Status is: Inpatient  Remains inpatient appropriate because:Ongoing diagnostic testing needed not appropriate for outpatient work up and Inpatient level of care appropriate due to severity of illness   Dispo: The patient is from: Home              Anticipated d/c is to: Home              Patient currently is not medically stable to d/c.   Difficult to place patient No   Subjective:  Patient states that she feels 10 months pregnant.  She is having significant abdominal discomfort from the ascites.  Has also had increased swelling in her legs.  She has been compliant with her medications daily.  She has not eaten much in the last few days given she feels full from  the abdominal pressure.   Objective: Temp:  [98.4 F (36.9 C)] 98.4 F (36.9 C) (05/23 1630) Pulse Rate:  [95-121] 115 (05/24 0515) Resp:  [11-25] 22 (05/24 0515) BP: (94-104)/(65-82) 97/67 (05/24 0515) SpO2:  [94 %-100 %] 95 % (05/24 0515) Physical Exam: General:  Ill-appearing, jaundiced, anxious appearing, sclera icteric Cardiovascular: Tachycardic, no murmurs Respiratory: CTA B, without wheezing/rhonchi/rales Abdomen: Abdomen distended with positive fluid wave, tender to palpation diffusely but more so in left upper quadrant and left lower quadrant, unable to appreciate liver edge, normoactive bowel sounds Extremities: 2+ pitting edema bilaterally to mid shin  Laboratory: Recent Labs  Lab 01/27/21 1314 02/02/21 1639 02/03/21 0432  WBC 10.3 10.4 9.9  HGB 9.8* 10.5* 8.2*  HCT 28.6* 32.5* 25.6*  PLT 275.0 247 185   Recent Labs  Lab 01/27/21 1314 01/28/21 1423 02/02/21 1639  NA 133* 135 133*  K 2.7* 3.2* 3.2*  CL 88* 90* 87*  CO2 34* 32 29  BUN 3* 3* <5*  CREATININE 0.71 0.64 0.78  CALCIUM 7.9* 8.3* 8.0*  PROT 5.6*  --  6.1*  BILITOT 9.2*  --  9.9*  ALKPHOS 415*  --  416*  ALT 83*  --  120*  AST 198*  --  519*  GLUCOSE 90 89 85   Imaging/Diagnostic Tests: DG Chest Port 1 View  Result Date: 02/02/2021 CLINICAL DATA:  Swelling and edema. Chest pain and shortness of breath. EXAM: PORTABLE CHEST 1 VIEW COMPARISON:  01/16/2021 radiographs and CT FINDINGS: Heart is normal in size. Normal mediastinal contours. Small pleural effusions better appreciated on CT. Streaky basilar atelectasis. No pulmonary edema. No pneumothorax. No confluent consolidation. No acute osseous abnormalities are seen. IMPRESSION: Small pleural effusions and bibasilar atelectasis. Electronically Signed   By: Keith Rake M.D.   On: 02/02/2021 18:48     Sharion Settler, DO 02/03/2021, 5:29 AM PGY-1, [place] Intern pager: [phone], text pages welcome

## 2021-02-03 NOTE — ED Notes (Signed)
PT transported to IR

## 2021-02-03 NOTE — Progress Notes (Signed)
Patient arrived to unit. No c/o pain. Bed alarm activated per, progressive monitor activated. Patient mom at bedside, oriented to room and [place] policies. Educated patient on how to order dinner

## 2021-02-03 NOTE — Evaluation (Signed)
Physical Therapy Evaluation Patient Details Name: Maloni Musleh MRN: [id number] DOB: [birth year] Today's Date: 02/03/2021   History of Present Illness  Pt is a 40 y/o female admitted secondary to worsening abdominal distention, likely from known cirrhosis. Pt is s/p paracentesis on 5/24. PMH includes Sjogren's syndrome, depression/anxiety.  Clinical Impression  Pt admitted secondary to problem above with deficits below. Pt requiring min guard A to stand and ambulate short distance within ED room. Pt reports tenderness in R abdomen. Pt reporting having difficulty standing from couch; discussed how to adjust surface height and other options for better seating surfaces. Discussed PT follow up at d/c, but pt reports she would prefer to wait and see how it goes at home first. Will continue to follow acutely.     Follow Up Recommendations No PT follow up;Supervision - Intermittent (Pt declining PT follow up at this time)    Equipment Recommendations  None recommended by PT (Pt's family reports they could get pt shower seat)    Recommendations for Other Services       Precautions / Restrictions Precautions Precautions: Fall Restrictions Weight Bearing Restrictions: No      Mobility  Bed Mobility Overal bed mobility: Needs Assistance Bed Mobility: Supine to Sit;Sit to Supine     Supine to sit: Min guard Sit to supine: Min guard   General bed mobility comments: Min guard for safety.    Transfers Overall transfer level: Needs assistance Equipment used: None Transfers: Sit to/from Stand Sit to Stand: Min guard         General transfer comment: Min guard for safety to stand from higher stretcher height.  Ambulation/Gait Ambulation/Gait assistance: Min guard Gait Distance (Feet): 5 Feet Assistive device: 1 person hand held assist Gait Pattern/deviations: Step-through pattern;Decreased stride length Gait velocity: Decreased   General Gait Details: min guard for safety to  take steps within ED room. Pt with mild unsteadiness noted and very guarded throughout. Mobility limited as pt wanting to eat.  Stairs            Wheelchair Mobility    Modified Rankin (Stroke Patients Only)       Balance Overall balance assessment: Mild deficits observed, not formally tested                                           Pertinent Vitals/Pain Pain Assessment: Faces Faces Pain Scale: Hurts a little bit Pain Location: L abdomen Pain Descriptors / Indicators: Grimacing;Guarding Pain Intervention(s): Limited activity within patient's tolerance;Monitored during session;Repositioned    Home Living Family/patient expects to be discharged to:: Private residence Living Arrangements: Alone Available Help at Discharge: Family Type of Home: House Home Access: Stairs to enter Entrance Stairs-Rails: None Entrance Stairs-Number of Steps: 2 Home Layout: One level Home Equipment: Environmental consultant - 2 wheels      Prior Function Level of Independence: Independent         Comments: Did report she is having trouble standing from couch.     Hand Dominance        Extremity/Trunk Assessment   Upper Extremity Assessment Upper Extremity Assessment: Defer to OT evaluation    Lower Extremity Assessment Lower Extremity Assessment: Generalized weakness (reports tingling in bilateral feet)    Cervical / Trunk Assessment Cervical / Trunk Assessment: Normal  Communication   Communication: No difficulties  Cognition Arousal/Alertness: Awake/alert Behavior During Therapy: WFL for tasks assessed/performed  Overall Cognitive Status: Within Functional Limits for tasks assessed                                        General Comments      Exercises     Assessment/Plan    PT Assessment Patient needs continued PT services  PT Problem List Decreased strength;Decreased activity tolerance;Decreased balance;Decreased mobility       PT  Treatment Interventions DME instruction;Functional mobility training;Stair training;Gait training;Balance training;Therapeutic exercise;Therapeutic activities;Patient/family education    PT Goals (Current goals can be found in the Care Plan section)  Acute Rehab PT Goals Patient Stated Goal: to go home PT Goal Formulation: With patient Time For Goal Achievement: 02/17/21 Potential to Achieve Goals: Good    Frequency Min 3X/week   Barriers to discharge        Co-evaluation               AM-PAC PT "6 Clicks" Mobility  Outcome Measure Help needed turning from your back to your side while in a flat bed without using bedrails?: A Little Help needed moving from lying on your back to sitting on the side of a flat bed without using bedrails?: A Little Help needed moving to and from a bed to a chair (including a wheelchair)?: A Little Help needed standing up from a chair using your arms (e.g., wheelchair or bedside chair)?: A Little Help needed to walk in hospital room?: A Little Help needed climbing 3-5 steps with a railing? : A Little 6 Click Score: 18    End of Session Equipment Utilized During Treatment: Gait belt Activity Tolerance: Patient limited by fatigue Patient left: in bed;with call bell/phone within reach;with family/visitor present (on stretcher in ED) Nurse Communication: Mobility status PT Visit Diagnosis: Muscle weakness (generalized) (M62.81);Unsteadiness on feet (R26.81)    Time: 1027-2536 PT Time Calculation (min) (ACUTE ONLY): 14 min   Charges:   PT Evaluation $PT Eval Low Complexity: 1 Low          Lou Miner, DPT  Acute Rehabilitation Services  Pager: [phone] Office: [phone]   Rudean Hitt 02/03/2021, 2:34 PM

## 2021-02-04 ENCOUNTER — Other Ambulatory Visit (HOSPITAL_COMMUNITY): Payer: Self-pay

## 2021-02-04 ENCOUNTER — Other Ambulatory Visit: Payer: 59

## 2021-02-04 ENCOUNTER — Other Ambulatory Visit: Payer: Self-pay | Admitting: Nurse Practitioner

## 2021-02-04 DIAGNOSIS — R945 Abnormal results of liver function studies: Secondary | ICD-10-CM

## 2021-02-04 DIAGNOSIS — E876 Hypokalemia: Secondary | ICD-10-CM

## 2021-02-04 DIAGNOSIS — T502X5A Adverse effect of carbonic-anhydrase inhibitors, benzothiadiazides and other diuretics, initial encounter: Secondary | ICD-10-CM

## 2021-02-04 DIAGNOSIS — D539 Nutritional anemia, unspecified: Secondary | ICD-10-CM

## 2021-02-04 LAB — CBC
HCT: 25 % — ABNORMAL LOW (ref 36.0–46.0)
Hemoglobin: 8.1 g/dL — ABNORMAL LOW (ref 12.0–15.0)
MCH: 36.8 pg — ABNORMAL HIGH (ref 26.0–34.0)
MCHC: 32.4 g/dL (ref 30.0–36.0)
MCV: 113.6 fL — ABNORMAL HIGH (ref 80.0–100.0)
Platelets: 164 10*3/uL (ref 150–400)
RBC: 2.2 MIL/uL — ABNORMAL LOW (ref 3.87–5.11)
RDW: 14.2 % (ref 11.5–15.5)
WBC: 6.8 10*3/uL (ref 4.0–10.5)
nRBC: 0 % (ref 0.0–0.2)

## 2021-02-04 LAB — COMPREHENSIVE METABOLIC PANEL
ALT: 80 U/L — ABNORMAL HIGH (ref 0–44)
AST: 291 U/L — ABNORMAL HIGH (ref 15–41)
Albumin: 1.7 g/dL — ABNORMAL LOW (ref 3.5–5.0)
Alkaline Phosphatase: 291 U/L — ABNORMAL HIGH (ref 38–126)
Anion gap: 10 (ref 5–15)
BUN: <5 mg/dL — ABNORMAL LOW (ref 6–20)
CO2: 31 mmol/L (ref 22–32)
Calcium: 7.8 mg/dL — ABNORMAL LOW (ref 8.9–10.3)
Chloride: 92 mmol/L — ABNORMAL LOW (ref 98–111)
Creatinine, Ser: 0.67 mg/dL (ref 0.44–1.00)
GFR, Estimated: >60 mL/min (ref >60)
Glucose, Bld: 74 mg/dL (ref 70–99)
Potassium: 3.1 mmol/L — ABNORMAL LOW (ref 3.5–5.1)
Sodium: 133 mmol/L — ABNORMAL LOW (ref 135–145)
Total Bilirubin: 8.2 mg/dL — ABNORMAL HIGH (ref 0.3–1.2)
Total Protein: 4.5 g/dL — ABNORMAL LOW (ref 6.5–8.1)

## 2021-02-04 LAB — TSH: TSH: 3.191 u[IU]/mL (ref 0.350–4.500)

## 2021-02-04 LAB — PATHOLOGIST SMEAR REVIEW

## 2021-02-04 MED ORDER — SPIRONOLACTONE 100 MG PO TABS
100.0000 mg | ORAL_TABLET | Freq: Every day | ORAL | 1 refills | Status: DC
  Filled 2021-02-04: qty 30, 30d supply, fill #0

## 2021-02-04 MED ORDER — FUROSEMIDE 40 MG PO TABS
40.0000 mg | ORAL_TABLET | Freq: Every day | ORAL | 1 refills | Status: DC
  Filled 2021-02-04: qty 30, 30d supply, fill #0

## 2021-02-04 MED ORDER — POTASSIUM CHLORIDE CRYS ER 20 MEQ PO TBCR
40.0000 meq | EXTENDED_RELEASE_TABLET | Freq: Two times a day (BID) | ORAL | Status: AC
  Administered 2021-02-04 (×2): 40 meq via ORAL
  Filled 2021-02-04 (×2): qty 2

## 2021-02-04 MED ORDER — OXYCODONE HCL 5 MG PO TABS
2.5000 mg | ORAL_TABLET | Freq: Three times a day (TID) | ORAL | 0 refills | Status: AC | PRN
  Filled 2021-02-04: qty 11, 7d supply, fill #0

## 2021-02-04 MED ORDER — LIDOCAINE 5 % EX PTCH
1.0000 | MEDICATED_PATCH | CUTANEOUS | Status: DC
  Administered 2021-02-04: 1 via TRANSDERMAL
  Filled 2021-02-04: qty 1

## 2021-02-04 NOTE — Discharge Instructions (Signed)
You were hospitalized at [facility] due to ascites, or a fluid buildup in your abdomen.  This is due to your liver cirrhosis. You had a paracentesis performed while in the hospital to remove some of the fluid. We are so glad you are feeling better.  Be sure to follow-up with your regularly scheduled appointments. You should follow up with your gastroenterologist and primary care provider. [facility] should contact you for an earlier appointment. Some of your medications have changed, you should take 100 mg of Spironolactone and 40 mg of Lasix daily. It is also important that you strictly avoid alcohol and follow a <2 gram sodium diet as this will help to prevent re-accumulation of fluid. Thank you for allowing [place] to take care of you.  Take care, [facility] family medicine team   Paracentesis, Care After This sheet gives you information about how to care for yourself after your procedure. Your health care provider may also give you more specific instructions. If you have problems or questions, contact your health care provider. What can I expect after the procedure? After the procedure, it is common to have a small amount of clear fluid coming from the puncture site. Follow these instructions at home: Puncture site care  Follow instructions from your health care provider about how to take care of your puncture site. Make sure you: ? Wash your hands with soap and water before and after you change your bandage (dressing). If soap and water are not available, use hand sanitizer. ? Change your dressing as told by your health care provider.  Check your puncture area every day for signs of infection. Check for: ? Redness, swelling, or pain. ? More fluid or blood. ? Warmth. ? Pus or a bad smell.   General instructions  Return to your normal activities as told by your health care provider. Ask your health care provider what activities are safe for you.  Take over-the-counter and prescription  medicines only as told by your health care provider.  Do not take baths, swim, or use a hot tub until your health care provider approves. Ask your health care provider if you may take showers. You may only be allowed to take sponge baths.  Keep all follow-up visits as told by your health care provider. This is important. Contact a health care provider if:  You have redness, swelling, or pain at your puncture site.  You have more fluid or blood coming from your puncture site.  Your puncture site feels warm to the touch.  You have pus or a bad smell coming from your puncture site.  You have a fever. Get help right away if:  You have chest pain or shortness of breath.  You develop increasing pain, discomfort, or swelling in your abdomen.  You feel dizzy or light-headed or you faint. Summary  After the procedure, it is common to have a small amount of clear fluid coming from the puncture site.  Follow instructions from your health care provider about how to take care of your puncture site.  Check your puncture area every day signs of infection.  Keep all follow-up visits as told by your health care provider. This information is not intended to replace advice given to you by your health care provider. Make sure you discuss any questions you have with your health care provider. Document Revised: 03/13/2019 Document Reviewed: 06/20/2018 Elsevier Patient Education  2021 [employer].

## 2021-02-04 NOTE — Progress Notes (Addendum)
[facility] Progress Note  Chief Complaint: Cryptogenic cirrhosis, jaundice, ascites  History: Jeanette Elliott feels somewhat better today, with improved energy and outlook.  She has less abdominal bloating and distention than when she came in, and is less short of breath. Both her parents were in the room during my visit. She denies chest pain cough or dysuria.   Objective:   Current Facility-Administered Medications:  .  enoxaparin (LOVENOX) injection 40 mg, 40 mg, Subcutaneous, Q24H, Espinoza, Alejandra, DO, 40 mg at 02/03/21 2206 .  escitalopram (LEXAPRO) tablet 10 mg, 10 mg, Oral, Daily, Anderson, Chelsey L, DO, 10 mg at 02/04/21 1022 .  feeding supplement (BOOST / RESOURCE BREEZE) liquid 1 Container, 1 Container, Oral, TID BM, Nelida Meuse III, MD, 1 Container at 47/09/62 8366 .  folic acid (FOLVITE) tablet 1 mg, 1 mg, Oral, Daily, Anderson, Chelsey L, DO, 1 mg at 02/04/21 1022 .  furosemide (LASIX) tablet 20 mg, 20 mg, Oral, Daily, Anderson, Chelsey L, DO, 20 mg at 02/04/21 1021 .  hydrOXYzine (ATARAX/VISTARIL) tablet 50 mg, 50 mg, Oral, TID, Espinoza, Alejandra, DO, 50 mg at 02/04/21 1022 .  lidocaine (LIDODERM) 5 % 1 patch, 1 patch, Transdermal, Q24H, Espinoza, Alejandra, DO, 1 patch at 02/04/21 1022 .  oxyCODONE (Oxy IR/ROXICODONE) immediate release tablet 2.5 mg, 2.5 mg, Oral, Q6H PRN, Espinoza, Alejandra, DO, 2.5 mg at 02/04/21 1046 .  potassium chloride SA (KLOR-CON) CR tablet 40 mEq, 40 mEq, Oral, BID WC, Paige, Victoria J, DO, 40 mEq at 02/04/21 1022 .  spironolactone (ALDACTONE) tablet 50 mg, 50 mg, Oral, q morning, Anderson, Chelsey L, DO, 50 mg at 02/04/21 1030     Vital signs in last 24 hrs: Vitals:   02/03/21 2333 02/04/21 0339  BP: 94/66 96/64  Pulse: 92 89  Resp: 14 14  Temp:    SpO2: 95% 94%   No intake or output data in the 24 hours ending 02/04/21 1246   Physical Exam   HEENT: sclera icteric, oral mucosa without lesions  Neck: supple, no thyromegaly, JVD  or lymphadenopathy  Cardiac: RRR without murmurs, S1S2 heard, no peripheral edema  Pulm: clear to auscultation bilaterally, normal RR and effort noted  Abdomen: soft, no tenderness, with active bowel sounds.  Mildly distended with ascites.  Skin; warm and dry, +jaundice.,  Spider nevi face and upper abdominal wall as before .  Neuro, alert and oriented, speech fluent, no asterixis Recent Labs:  CBC Latest Ref Rng & Units 02/04/2021 02/03/2021 02/02/2021  WBC 4.0 - 10.5 K/uL 6.8 9.9 10.4  Hemoglobin 12.0 - 15.0 g/dL 8.1(L) 8.2(L) 10.5(L)  Hematocrit 36.0 - 46.0 % 25.0(L) 25.6(L) 32.5(L)  Platelets 150 - 400 K/uL 164 185 247    Recent Labs  Lab 02/02/21 2011  INR 1.3*   CMP Latest Ref Rng & Units 02/04/2021 02/03/2021 02/02/2021  Glucose 70 - 99 mg/dL 74 58(L) 85  BUN 6 - 20 mg/dL <5(L) <5(L) <5(L)  Creatinine 0.44 - 1.00 mg/dL 0.67 0.64 0.78  Sodium 135 - 145 mmol/L 133(L) 132(L) 133(L)  Potassium 3.5 - 5.1 mmol/L 3.1(L) 4.2 3.2(L)  Chloride 98 - 111 mmol/L 92(L) 90(L) 87(L)  CO2 22 - 32 mmol/L 31 25 29   Calcium 8.9 - 10.3 mg/dL 7.8(L) 7.1(L) 8.0(L)  Total Protein 6.5 - 8.1 g/dL 4.5(L) 4.9(L) 6.1(L)  Total Bilirubin 0.3 - 1.2 mg/dL 8.2(H) 8.6(H) 9.9(H)  Alkaline Phos 38 - 126 U/L 291(H) 311(H) 416(H)  AST 15 - 41 U/L 291(H) 424(H) 519(H)  ALT 0 -  44 U/L 80(H) 95(H) 120(H)   LFTs are curiously improved   Assessment & Plan  Assessment: Cholestatic jaundice from cirrhosis of as yet unclear cause.  There is clinical concern for Wilson's disease despite normal serum tests. Elevated LFTs from her chronic liver disease.  I cannot account for why she had an increase a couple of days ago that is now improved.  Her reported 3 diagnosis alcohol use was modest, and she is adamant that she has had no alcohol in quite some time.  She also does not take any herbal or other supplements.  Portal hypertensive ascites, recently worsened requiring paracentesis.  She has been on spironolactone 50  mg a day and furosemide 40 mg a day (which was reportedly a recent increase from the 20 mg dose according to her mother) Blood pressure runs low, so there is not much room to increase her diuretics with risk of prerenal azotemia.  I discussed her case extensively today with a hepatologist at [facility] and subsequently with my partner Dr. Bryan Lemma, her primary GI physician with our practice. That hepatologist plans to contact the patient and get her seen in the next few weeks in their clinic.  They recommended an outpatient 24-hour urine collection for copper.  Plan: Discharge home today 2000 mg daily sodium restriction, which was reviewed with the patient. Spironolactone 100 mg daily, furosemide 40 mg daily Dr. Bryan Lemma will arrange an outpatient urine study as noted above, and also have our office contact her with that and plans for a CMP in a week.  Outpatient upper endoscopy for variceal screening.  Outpatient clinic evaluation by [facility] hepatology as noted above.  Thanks to the medical service for care of this patient.   35 minutes were spent on this encounter (including chart review, history/exam, counseling/coordination of care, and documentation) > 50% of that time was spent on counseling and coordination of care.  Topics discussed included: see above   Nelida Meuse III Office: [phone]

## 2021-02-04 NOTE — Evaluation (Signed)
Occupational Therapy Evaluation Patient Details Name: Jeanette Elliott MRN: [id number] DOB: 1981/08/05 Today's Date: 02/04/2021    History of Present Illness Pt is a 40 y/o female admitted secondary to worsening abdominal distention, likely from known cirrhosis. Pt is s/p paracentesis on 5/24. PMH includes Sjogren's syndrome, depression/anxiety.   Clinical Impression   PTA patient was living alone in a private residence and was grossly I with ADLs/IADLs. Patient currently functioning near baseline demonstrating observed ADLs including UB bathing/dressing and grooming standing at sink level with Mod I and need for slightly increased time secondary to L flank/abdomen pain. Patient does not require continued acute occupational therapy services with OT to sign off at this time. Patient hopeful to return home today with assist from family as needed with IADLs including caring for her cats.     Follow Up Recommendations  No OT follow up    Equipment Recommendations  Tub/shower seat    Recommendations for Other Services       Precautions / Restrictions Precautions Precautions: Fall Restrictions Weight Bearing Restrictions: No      Mobility Bed Mobility Overal bed mobility: Modified Independent             General bed mobility comments: Supine <> EOB with Mod I. Requires increased time 2/2 pain in L abdomen.    Transfers Overall transfer level: Needs assistance Equipment used: None Transfers: Sit to/from Stand Sit to Stand: Supervision         General transfer comment: Supervision A for safety.    Balance Overall balance assessment: No apparent balance deficits (not formally assessed)                                         ADL either performed or assessed with clinical judgement   ADL Overall ADL's : Modified independent                                       General ADL Comments: Requires increased time. Does not require external  assist.     Vision Baseline Vision/History: Wears glasses Wears Glasses: Reading only Patient Visual Report: No change from baseline       Perception     Praxis      Pertinent Vitals/Pain Pain Assessment: Faces Faces Pain Scale: Hurts a little bit Pain Location: L abdomen Pain Descriptors / Indicators: Grimacing;Guarding Pain Intervention(s): Limited activity within patient's tolerance;Monitored during session;Repositioned     Hand Dominance Right   Extremity/Trunk Assessment Upper Extremity Assessment Upper Extremity Assessment: Generalized weakness   Lower Extremity Assessment Lower Extremity Assessment: Defer to PT evaluation   Cervical / Trunk Assessment Cervical / Trunk Assessment: Normal   Communication Communication Communication: No difficulties   Cognition Arousal/Alertness: Awake/alert Behavior During Therapy: WFL for tasks assessed/performed Overall Cognitive Status: Within Functional Limits for tasks assessed                                     General Comments       Exercises     Shoulder Instructions      Home Living Family/patient expects to be discharged to:: Private residence Living Arrangements: Alone Available Help at Discharge: Family Type of Home: House Home Access: Stairs to enter Entrance  Stairs-Number of Steps: 2 Entrance Stairs-Rails: None Home Layout: One level     Bathroom Shower/Tub: Chief Strategy Officer: Standard     Home Equipment: Walker - 2 wheels          Prior Functioning/Environment Level of Independence: Independent        Comments: Did report she is having trouble standing from couch.        OT Problem List: Decreased strength      OT Treatment/Interventions:      OT Goals(Current goals can be found in the care plan section) Acute Rehab OT Goals Patient Stated Goal: to go home OT Goal Formulation: With patient  OT Frequency:     Barriers to D/C:             Co-evaluation              AM-PAC OT "6 Clicks" Daily Activity     Outcome Measure Help from another person eating meals?: None Help from another person taking care of personal grooming?: None Help from another person toileting, which includes using toliet, bedpan, or urinal?: None Help from another person bathing (including washing, rinsing, drying)?: A Little Help from another person to put on and taking off regular upper body clothing?: None Help from another person to put on and taking off regular lower body clothing?: None 6 Click Score: 23   End of Session Nurse Communication: Mobility status  Activity Tolerance: Patient tolerated treatment well Patient left: in bed;with call bell/phone within reach;with bed alarm set  OT Visit Diagnosis: Muscle weakness (generalized) (M62.81)                Time: 7829-5621 OT Time Calculation (min): 19 min Charges:  OT General Charges $OT Visit: 1 Visit OT Evaluation $OT Eval Low Complexity: 1 Low  Jeanette Elliott H. OTR/L Supplemental OT, Department of rehab services [phone]  Jeanette Elliott R H. 02/04/2021, 8:24 AM

## 2021-02-05 ENCOUNTER — Telehealth: Payer: Self-pay

## 2021-02-05 NOTE — Telephone Encounter (Signed)
Yes she can pick up urine collection container from our office and return to our office. She can also go to [facility]. Order previously placed by Dr. Gerrit Heck (GI)

## 2021-02-05 NOTE — Telephone Encounter (Signed)
Transition Care Management Follow-up Telephone Call  Date of discharge and from where: 02/04/21-[facility]  How have you been since you were released from the hospital? Doing ok.patient states- Stomach is a little swollen but I have been eating more  Any questions or concerns? No  Items Reviewed:  Did the pt receive and understand the discharge instructions provided? Yes   Medications obtained and verified? Yes   Other? Yes   Any new allergies since your discharge? No   Dietary orders reviewed? Yes  Do you have support at home? No lives alone-but she has neighbors & parents to call if needed  Home Care and Equipment/Supplies: Were home health services ordered? no If so, what is the name of the agency? n/a  Has the agency set up a time to come to the patient's home? not applicable Were any new equipment or medical supplies ordered?  No What is the name of the medical supply agency? n/a Were you able to get the supplies/equipment? not applicable Do you have any questions related to the use of the equipment or supplies? n/a  Functional Questionnaire: (I = Independent and D = Dependent) ADLs: I  Bathing/Dressing- I  Meal Prep- I  Eating- I  Maintaining continence- I  Transferring/Ambulation- I  Managing Meds- I  Follow up appointments reviewed:   PCP Hospital f/u appt confirmed? Yes  Scheduled to see Dr. Bryan Lemma on 02/06/21 @ 3:00.  [facility] f/u appt confirmed? Yes  Scheduled to see Bhami Patel([facility]) on 02/10/21 @ 8:30.  Are transportation arrangements needed? No   If their condition worsens, is the pt aware to call PCP or go to the Emergency Dept.? Yes  Was the patient provided with contact information for the PCP's office or ED? Yes  Was to pt encouraged to call back with questions or concerns? Yes

## 2021-02-05 NOTE — Telephone Encounter (Signed)
Patient is asking where to get the container for the 24 hour urine collection. Are they available at the [place] office?

## 2021-02-05 NOTE — Telephone Encounter (Signed)
Appt with PCP cancelled. Patient notified.

## 2021-02-06 ENCOUNTER — Inpatient Hospital Stay: Payer: 59 | Admitting: Family Medicine

## 2021-02-06 ENCOUNTER — Telehealth: Payer: Self-pay

## 2021-02-06 ENCOUNTER — Telehealth: Payer: Self-pay | Admitting: Family Medicine

## 2021-02-06 DIAGNOSIS — R188 Other ascites: Secondary | ICD-10-CM

## 2021-02-06 DIAGNOSIS — E876 Hypokalemia: Secondary | ICD-10-CM

## 2021-02-06 DIAGNOSIS — J90 Pleural effusion, not elsewhere classified: Secondary | ICD-10-CM

## 2021-02-06 DIAGNOSIS — T502X5A Adverse effect of carbonic-anhydrase inhibitors, benzothiadiazides and other diuretics, initial encounter: Secondary | ICD-10-CM

## 2021-02-06 DIAGNOSIS — R17 Unspecified jaundice: Secondary | ICD-10-CM

## 2021-02-06 LAB — BODY FLUID CULTURE W GRAM STAIN
Culture: NO GROWTH
Gram Stain: NONE SEEN

## 2021-02-06 MED ORDER — FOLIC ACID 1 MG PO TABS: 1.0000 mg | ORAL_TABLET | Freq: Every day | ORAL | 1 refills | Status: DC

## 2021-02-06 MED ORDER — POTASSIUM CHLORIDE CRYS ER 20 MEQ PO TBCR: 40.0000 meq | EXTENDED_RELEASE_TABLET | Freq: Every day | ORAL | 0 refills | Status: DC

## 2021-02-06 NOTE — Telephone Encounter (Signed)
Last fill for Potassium 01/27/21 #14/0 [facility] Last fill for Folic Acid 35/43/01  #48/4  Sharion Settler, DO

## 2021-02-06 NOTE — Telephone Encounter (Signed)
Rx refilled.

## 2021-02-06 NOTE — Telephone Encounter (Signed)
-----   Message from Lavena Bullion, DO sent at 02/05/2021 10:29 AM EDT ----- She hopefully will have an appt with [facility] next week. Fingers crossed for her. In the meantime, plan for the following: - CMP in 1 week - 24 hour urine collect for copper as I believe you already have in place - Will need EGD for variceal screening. Can be done at [facility]  It is possible that these will get done at [facility], but easier to have all scheduled on the books and she can call us to cancel anything if Rob Hickman is takign care of it. Usually the endoscopies will get done locally by [place] even if she continues to follow long-term with [facility].   Thanks

## 2021-02-06 NOTE — Telephone Encounter (Signed)
Appointment is Tuesday with [facility]. Patient stated that she already picked up the kit to do the urine. She will do it on Wednesday. Patient will get lab work on the second floor next week and patient will call back regarding scheduling an EGD for screening of esophageal varies.  Patient wants you to look her labs that were done for her paracentesis please

## 2021-02-06 NOTE — Telephone Encounter (Signed)
Patient needs a refill on Folic Acid and Potassium. If approved, please send to [facility] in [facility] and call her at [phone].

## 2021-02-10 NOTE — Telephone Encounter (Signed)
Patient made aware.

## 2021-02-13 ENCOUNTER — Other Ambulatory Visit: Payer: Self-pay | Admitting: General Surgery

## 2021-02-13 ENCOUNTER — Other Ambulatory Visit: Payer: Self-pay | Admitting: Gastroenterology

## 2021-02-13 ENCOUNTER — Telehealth: Payer: Self-pay | Admitting: General Surgery

## 2021-02-13 ENCOUNTER — Other Ambulatory Visit (INDEPENDENT_AMBULATORY_CARE_PROVIDER_SITE_OTHER): Payer: 59

## 2021-02-13 ENCOUNTER — Other Ambulatory Visit: Payer: Self-pay

## 2021-02-13 DIAGNOSIS — R188 Other ascites: Secondary | ICD-10-CM

## 2021-02-13 DIAGNOSIS — E876 Hypokalemia: Secondary | ICD-10-CM

## 2021-02-13 DIAGNOSIS — T502X5A Adverse effect of carbonic-anhydrase inhibitors, benzothiadiazides and other diuretics, initial encounter: Secondary | ICD-10-CM | POA: Diagnosis not present

## 2021-02-13 DIAGNOSIS — K7460 Unspecified cirrhosis of liver: Secondary | ICD-10-CM

## 2021-02-13 NOTE — Telephone Encounter (Signed)
Contacted the patient and notified her that she is scheduled for 02/16/2021 at 1:45. Patient was informed that she needs to go straight to the ED if any problems over the weekend. Patient verbalized understanding. She also scheduled her EGD per Dr Bryan Lemma for 03/10/2021. Instructions to be sent via mychart.

## 2021-02-13 NOTE — Telephone Encounter (Signed)
-----   Message from Lavena Bullion, DO sent at 02/13/2021  9:35 AM EDT ----- I spoke with the Hepatologist at [facility] about [place] today. They are continuing her work-up, to include sending her pathology off to [facility] for further review. She had to reduce her diuretics and start her on midodrine due to hypotension. Because of that, she thought we may need to set her up for serial paracentesis. Please call this patient and let her know that if he is having fluid accumulation/ascites, to call us, and we will get her in to IR for paracentesis with IV albumin, and likely limit to 2-3 L removal due to her hypotension. If she does require paracentesis, it may need to be repeated every few weeks for the time being since she can only handle so much in the way of diuretics.   Otherwise, to complete EGD for EV screening with [place] as planned.   Thanks

## 2021-02-13 NOTE — Progress Notes (Signed)
Per orders of Dr. Corinne Ports pt is here to drop off urine sample, pt was able to privde an adequate amount of urine.

## 2021-02-13 NOTE — Telephone Encounter (Signed)
Called central scheduling back and explained to [place] that I spoke with him earlier today and he stated I could not schedule the paracentesis through him. So I called IR- after 3 times trying IR they told me to call central scheduling. Patient unable to be scheduled today for paracentesis. He scheduled her for 02/16/2021 at 1:45 [facility] IR

## 2021-02-13 NOTE — Progress Notes (Signed)
Error

## 2021-02-13 NOTE — Telephone Encounter (Signed)
Order placed for Paracentisis at [facility] notified patients mother

## 2021-02-13 NOTE — Telephone Encounter (Signed)
-----   Message from Lavena Bullion, DO sent at 02/13/2021  9:35 AM EDT ----- I spoke with the Hepatologist at [facility] about [place] today. They are continuing her work-up, to include sending her pathology off to [facility] for further review. She had to reduce her diuretics and start her on midodrine due to hypotension. Because of that, she thought we may need to set her up for serial paracentesis. Please call this patient and let her know that if he is having fluid accumulation/ascites, to call us, and we will get her in to IR for paracentesis with IV albumin, and likely limit to 2-3 L removal due to her hypotension. If she does require paracentesis, it may need to be repeated every few weeks for the time being since she can only handle so much in the way of diuretics.   Otherwise, to complete EGD for EV screening with [place] as planned.   Thanks

## 2021-02-13 NOTE — Telephone Encounter (Signed)
Called and spoke with tiffany at [facility] IR she was unable to schedule and transferred me to central scheduling and we got disconnected

## 2021-02-13 NOTE — Telephone Encounter (Signed)
Left a message x2 for Anderson Malta to call to schedule a stat paracentisis.

## 2021-02-16 ENCOUNTER — Other Ambulatory Visit: Payer: Self-pay | Admitting: Gastroenterology

## 2021-02-16 ENCOUNTER — Other Ambulatory Visit: Payer: Self-pay

## 2021-02-16 ENCOUNTER — Telehealth: Payer: Self-pay

## 2021-02-16 ENCOUNTER — Ambulatory Visit (HOSPITAL_COMMUNITY)
Admission: RE | Admit: 2021-02-16 | Discharge: 2021-02-16 | Disposition: A | Discharge status: Home / Self Care | Payer: 59 | Source: Ambulatory Visit | Attending: Gastroenterology | Admitting: Gastroenterology

## 2021-02-16 DIAGNOSIS — K7460 Unspecified cirrhosis of liver: Secondary | ICD-10-CM | POA: Insufficient documentation

## 2021-02-16 DIAGNOSIS — R188 Other ascites: Secondary | ICD-10-CM | POA: Diagnosis not present

## 2021-02-16 MED ORDER — ALBUMIN HUMAN 25 % IV SOLN
INTRAVENOUS | Status: AC
  Filled 2021-02-16: qty 200

## 2021-02-16 NOTE — Telephone Encounter (Signed)
error 

## 2021-02-16 NOTE — Telephone Encounter (Deleted)
-----   Message from Lavena Bullion, DO sent at 02/13/2021  9:35 AM EDT ----- I spoke with the Hepatologist at [facility] about [place] today. They are continuing her work-up, to include sending her pathology off to [facility] for further review. She had to reduce her diuretics and start her on midodrine due to hypotension. Because of that, she thought we may need to set her up for serial paracentesis. Please call this patient and let her know that if he is having fluid accumulation/ascites, to call us, and we will get her in to IR for paracentesis with IV albumin, and likely limit to 2-3 L removal due to her hypotension. If she does require paracentesis, it may need to be repeated every few weeks for the time being since she can only handle so much in the way of diuretics.   Otherwise, to complete EGD for EV screening with [place] as planned.   Thanks

## 2021-02-18 ENCOUNTER — Telehealth: Payer: Self-pay

## 2021-02-18 ENCOUNTER — Telehealth: Payer: Self-pay | Admitting: General Surgery

## 2021-02-18 NOTE — Telephone Encounter (Signed)
Rosa calling from [facility] regards to 24 hour urine specimen dropped off by pt.  They are needing a total volume for the specimen, 24hr Copper Test).  Rosa said that it was not on the requisition  Sent.  Please advise.  CB# is [phone]

## 2021-02-18 NOTE — Telephone Encounter (Signed)
Contacted the patient, she is still having a lot of pain and is having severe nausea with nothing to take for it. She states she is down to 116lb because she is unable to eat.

## 2021-02-18 NOTE — Telephone Encounter (Signed)
-----   Message from [place], DO sent at 02/18/2021  8:32 AM EDT ----- Ultrasound from earlier this week with only scant ascites that would not be amenable to paracentesis.  Can you please call to check in on the patient to see how she is feeling.

## 2021-02-18 NOTE — Telephone Encounter (Signed)
I do not know the total volume as she brought specimen container to the lab and did not see me in the office. [place], can you help with this??

## 2021-02-18 NOTE — Telephone Encounter (Signed)
Dr. Loletha Grayer are you able to help with this one?

## 2021-02-18 NOTE — Telephone Encounter (Signed)
Tried to contact the patient, home number not working, mobile number has no vm set up. Will try again later.

## 2021-02-19 ENCOUNTER — Telehealth: Payer: Self-pay | Admitting: Family Medicine

## 2021-02-19 LAB — COPPER, URINE, 24 HOUR
Copper,Urine (24 Hr): 18 mcg/24 h (ref 15–60)
Total Volume: 2000 mL

## 2021-02-19 LAB — TIQ-MISC

## 2021-02-19 NOTE — Telephone Encounter (Signed)
Pt calling about lexapro, she has questions and said she feels like it makes her feel worse

## 2021-02-20 ENCOUNTER — Emergency Department (HOSPITAL_COMMUNITY): Payer: 59

## 2021-02-20 ENCOUNTER — Other Ambulatory Visit: Payer: Self-pay

## 2021-02-20 ENCOUNTER — Observation Stay (HOSPITAL_COMMUNITY)
Admission: EM | Admit: 2021-02-20 | Discharge: 2021-02-20 | Disposition: A | Discharge status: Home / Self Care | Payer: 59 | Attending: Family Medicine | Admitting: Family Medicine

## 2021-02-20 DIAGNOSIS — Z91040 Latex allergy status: Secondary | ICD-10-CM | POA: Diagnosis not present

## 2021-02-20 DIAGNOSIS — R000 Tachycardia, unspecified: Secondary | ICD-10-CM | POA: Diagnosis not present

## 2021-02-20 DIAGNOSIS — H15849 Scleral ectasia, unspecified eye: Secondary | ICD-10-CM | POA: Insufficient documentation

## 2021-02-20 DIAGNOSIS — R109 Unspecified abdominal pain: Secondary | ICD-10-CM | POA: Diagnosis present

## 2021-02-20 DIAGNOSIS — K7460 Unspecified cirrhosis of liver: Secondary | ICD-10-CM | POA: Diagnosis not present

## 2021-02-20 DIAGNOSIS — R2243 Localized swelling, mass and lump, lower limb, bilateral: Secondary | ICD-10-CM | POA: Diagnosis not present

## 2021-02-20 DIAGNOSIS — J45909 Unspecified asthma, uncomplicated: Secondary | ICD-10-CM | POA: Insufficient documentation

## 2021-02-20 DIAGNOSIS — Z79899 Other long term (current) drug therapy: Secondary | ICD-10-CM | POA: Diagnosis not present

## 2021-02-20 DIAGNOSIS — Z20822 Contact with and (suspected) exposure to covid-19: Secondary | ICD-10-CM | POA: Insufficient documentation

## 2021-02-20 DIAGNOSIS — E860 Dehydration: Secondary | ICD-10-CM

## 2021-02-20 DIAGNOSIS — K7031 Alcoholic cirrhosis of liver with ascites: Secondary | ICD-10-CM

## 2021-02-20 DIAGNOSIS — E876 Hypokalemia: Secondary | ICD-10-CM | POA: Insufficient documentation

## 2021-02-20 DIAGNOSIS — N179 Acute kidney failure, unspecified: Secondary | ICD-10-CM | POA: Diagnosis not present

## 2021-02-20 DIAGNOSIS — E871 Hypo-osmolality and hyponatremia: Secondary | ICD-10-CM | POA: Insufficient documentation

## 2021-02-20 DIAGNOSIS — R079 Chest pain, unspecified: Secondary | ICD-10-CM | POA: Insufficient documentation

## 2021-02-20 DIAGNOSIS — R531 Weakness: Secondary | ICD-10-CM

## 2021-02-20 DIAGNOSIS — R112 Nausea with vomiting, unspecified: Secondary | ICD-10-CM | POA: Insufficient documentation

## 2021-02-20 DIAGNOSIS — E869 Volume depletion, unspecified: Secondary | ICD-10-CM

## 2021-02-20 LAB — CBC WITH DIFFERENTIAL/PLATELET
Abs Immature Granulocytes: 0.1 10*3/uL — ABNORMAL HIGH (ref 0.00–0.07)
Basophils Absolute: 0.1 10*3/uL (ref 0.0–0.1)
Basophils Relative: 1 %
Eosinophils Absolute: 0.1 10*3/uL (ref 0.0–0.5)
Eosinophils Relative: 1 %
HCT: 26.9 % — ABNORMAL LOW (ref 36.0–46.0)
Hemoglobin: 8.9 g/dL — ABNORMAL LOW (ref 12.0–15.0)
Immature Granulocytes: 1 %
Lymphocytes Relative: 10 %
Lymphs Abs: 1.1 10*3/uL (ref 0.7–4.0)
MCH: 35.6 pg — ABNORMAL HIGH (ref 26.0–34.0)
MCHC: 33.1 g/dL (ref 30.0–36.0)
MCV: 107.6 fL — ABNORMAL HIGH (ref 80.0–100.0)
Monocytes Absolute: 1.4 10*3/uL — ABNORMAL HIGH (ref 0.1–1.0)
Monocytes Relative: 14 %
Neutro Abs: 7.7 10*3/uL (ref 1.7–7.7)
Neutrophils Relative %: 73 %
Platelets: 216 10*3/uL (ref 150–400)
RBC: 2.5 MIL/uL — ABNORMAL LOW (ref 3.87–5.11)
RDW: 15 % (ref 11.5–15.5)
WBC: 10.5 10*3/uL (ref 4.0–10.5)
nRBC: 0 % (ref 0.0–0.2)

## 2021-02-20 LAB — COMPREHENSIVE METABOLIC PANEL
ALT: 95 U/L — ABNORMAL HIGH (ref 0–44)
AST: 342 U/L — ABNORMAL HIGH (ref 15–41)
Albumin: 2.5 g/dL — ABNORMAL LOW (ref 3.5–5.0)
Alkaline Phosphatase: 371 U/L — ABNORMAL HIGH (ref 38–126)
Anion gap: 32 — ABNORMAL HIGH (ref 5–15)
BUN: 5 mg/dL — ABNORMAL LOW (ref 6–20)
CO2: 16 mmol/L — ABNORMAL LOW (ref 22–32)
Calcium: 8.5 mg/dL — ABNORMAL LOW (ref 8.9–10.3)
Chloride: 81 mmol/L — ABNORMAL LOW (ref 98–111)
Creatinine, Ser: 1.02 mg/dL — ABNORMAL HIGH (ref 0.44–1.00)
GFR, Estimated: >60 mL/min (ref >60)
Glucose, Bld: 94 mg/dL (ref 70–99)
Potassium: 3.4 mmol/L — ABNORMAL LOW (ref 3.5–5.1)
Sodium: 129 mmol/L — ABNORMAL LOW (ref 135–145)
Total Bilirubin: 8.4 mg/dL — ABNORMAL HIGH (ref 0.3–1.2)
Total Protein: 6.4 g/dL — ABNORMAL LOW (ref 6.5–8.1)

## 2021-02-20 LAB — I-STAT BETA HCG BLOOD, ED (MC, WL, AP ONLY): I-stat hCG, quantitative: <5 m[IU]/mL (ref <5)

## 2021-02-20 LAB — RESP PANEL BY RT-PCR (FLU A&B, COVID) ARPGX2
Influenza A by PCR: NEGATIVE
Influenza B by PCR: NEGATIVE
SARS Coronavirus 2 by RT PCR: NEGATIVE

## 2021-02-20 LAB — PROTIME-INR
INR: 1.2 (ref 0.8–1.2)
Prothrombin Time: 15.4 seconds — ABNORMAL HIGH (ref 11.4–15.2)

## 2021-02-20 LAB — MAGNESIUM: Magnesium: 1.3 mg/dL — ABNORMAL LOW (ref 1.7–2.4)

## 2021-02-20 LAB — PHOSPHORUS: Phosphorus: 3.5 mg/dL (ref 2.5–4.6)

## 2021-02-20 LAB — ETHANOL: Alcohol, Ethyl (B): <10 mg/dL (ref <10)

## 2021-02-20 LAB — LIPASE, BLOOD: Lipase: 48 U/L (ref 11–51)

## 2021-02-20 LAB — AMMONIA: Ammonia: 38 umol/L — ABNORMAL HIGH (ref 9–35)

## 2021-02-20 MED ORDER — THIAMINE HCL 100 MG PO TABS: 100.0000 mg | ORAL_TABLET | Freq: Every day | ORAL | Status: DC

## 2021-02-20 MED ORDER — FOLIC ACID 1 MG PO TABS: 1.0000 mg | ORAL_TABLET | Freq: Every day | ORAL | Status: DC

## 2021-02-20 MED ORDER — MAGNESIUM SULFATE 2 GM/50ML IV SOLN
2.0000 g | Freq: Once | INTRAVENOUS | Status: DC
  Filled 2021-02-20: qty 50

## 2021-02-20 MED ORDER — ADULT MULTIVITAMIN W/MINERALS CH: 1.0000 | ORAL_TABLET | Freq: Every day | ORAL | Status: DC

## 2021-02-20 MED ORDER — SPIRONOLACTONE 100 MG PO TABS: 100.0000 mg | ORAL_TABLET | Freq: Every day | ORAL | Status: DC

## 2021-02-20 MED ORDER — ESCITALOPRAM OXALATE 10 MG PO TABS: 10.0000 mg | ORAL_TABLET | Freq: Every day | ORAL | Status: DC

## 2021-02-20 MED ORDER — HEPARIN SODIUM (PORCINE) 5000 UNIT/ML IJ SOLN
5000.0000 [IU] | Freq: Three times a day (TID) | INTRAMUSCULAR | Status: DC
  Administered 2021-02-20: 5000 [IU] via SUBCUTANEOUS
  Filled 2021-02-20: qty 1

## 2021-02-20 MED ORDER — ONDANSETRON HCL 4 MG/2ML IJ SOLN: 4.0000 mg | Freq: Four times a day (QID) | INTRAMUSCULAR | Status: DC | PRN

## 2021-02-20 MED ORDER — ONDANSETRON HCL 4 MG/2ML IJ SOLN
4.0000 mg | Freq: Once | INTRAMUSCULAR | Status: AC
  Administered 2021-02-20: 4 mg via INTRAVENOUS
  Filled 2021-02-20: qty 2

## 2021-02-20 MED ORDER — IOHEXOL 300 MG/ML  SOLN
75.0000 mL | Freq: Once | INTRAMUSCULAR | Status: AC | PRN
  Administered 2021-02-20: 75 mL via INTRAVENOUS

## 2021-02-20 MED ORDER — HYDROXYZINE HCL 25 MG PO TABS
50.0000 mg | ORAL_TABLET | Freq: Every day | ORAL | Status: DC
  Administered 2021-02-20: 50 mg via ORAL
  Filled 2021-02-20: qty 2

## 2021-02-20 MED ORDER — SODIUM CHLORIDE 0.9 % IV BOLUS
1000.0000 mL | Freq: Once | INTRAVENOUS | Status: AC
  Administered 2021-02-20: 1000 mL via INTRAVENOUS

## 2021-02-20 MED ORDER — OXYCODONE HCL 5 MG PO TABS
2.5000 mg | ORAL_TABLET | Freq: Four times a day (QID) | ORAL | Status: DC | PRN
Start: 2021-02-20 — End: 2021-02-21
  Administered 2021-02-20: 2.5 mg via ORAL
  Filled 2021-02-20: qty 1

## 2021-02-20 MED ORDER — FUROSEMIDE 20 MG PO TABS: 20.0000 mg | ORAL_TABLET | Freq: Every day | ORAL | Status: DC

## 2021-02-20 MED ORDER — ONDANSETRON HCL 4 MG PO TABS: 4.0000 mg | ORAL_TABLET | Freq: Four times a day (QID) | ORAL | Status: DC | PRN

## 2021-02-20 NOTE — Telephone Encounter (Signed)
Pt calling to inform provider that her medication seems to not be affective to her any longer.  Pt is currently taking Lexapro 32m.

## 2021-02-20 NOTE — Telephone Encounter (Signed)
She can increase to 56m daily x 2-3 wks and see if that is effective. [facility] (I provided this info to pt at last OV on 01/22/21)

## 2021-02-20 NOTE — Discharge Summary (Addendum)
Family Medicine Teaching Service Against Medical Advice  Patient name: Jeanette Elliott Medical record number: [id number] Date of birth: 1980/10/25 Age: 40 y.o. Gender: female Date of Admission: 02/20/2021  Date of AMA: 02/20/2021 Admitting Physician: Doreene Eland, MD  Primary Care Provider: Overton Mam, DO Consultants: None  Indication for Hospitalization: Generalized weakness, nausea, vomiting, electrolyte imbalance  Discharge Diagnoses/Problem List:  Abdominal pain 2/2 liver cirrhosis Hypokalemia Hyponatremia AKI Transaminitis Macrocytic anemia Tachycardia Bilateral lower extremity edema Pressure-like chest pain Nausea and vomiting  Disposition: Home  Discharge Condition: Stable  Discharge Exam:  General: Ill-appearing female lying comfortably in bed, NAD HEENT: [place]/AT, MMM Cardiovascular: Regular rhythm, tachycardia present, no m/r/g Pulmonary: Clear to auscultation bilaterally Abdominal: Soft, diffusely distended, tenderness +, BS+ Musculoskeletal:2+ edema appreciated bilaterally in lower extremities Skin: Warm and dry Neuro: AAOx3 Psych: Anxious mood and affect, tearful at times Brief Hospital Course:  Jeanette Elliott is a 40 y.o. female presenting with generalized weakness, nausea and vomiting . PMH is significant for Cirrhosis, Vit B12 and Iron deficiency anemia, GERD, PTSD, anxiety, HTN, Hep A, ascites, L 9th anterior rib fracture   Patient left AGAINST MEDICAL ADVICE. Brief Hospital course below includes all the problems that were discussed with patient and was encouraged to stay at least overnight.  Patient was tearful and even after being approved multiple times declined to stay in hospital for any further treatment. She presented with abdominal pain most likely due to her liver cirrhosis.  At home she takes Lasix 20 mg and spironolactone 100 mg and last she took was this a.m.  She started having nausea and vomiting and was unable to keep anything down  orally and that is why presented to ED.  Received IV fluid and IV Zofran and that helped her feel overall better.  Her MELD score is 23, 19.6% mortality in 3 months.   Explained about ongoing hypokalemia sCr~ 3.4. Patient has recurrent h/o Hypokalemia. She was on 20 meq Na+ at home but recently d/c'd by Dr. Brooke Dare due to increase in her Spironolactone 100 mg.  Patient states that this is not new for her and she feels comfortable going back home and will repeat her labs on Monday.  Informed about hyponatremia, Na+ 129 on admission. Also, had hyponatremia during recent admission and one before that. Most likely due to loop diuretics.  Along with it, explained about about ongoing Hypotension, AKI, Transaminitis, Macrocytic anemia, Tachycardia, B/L Leg swelling but patient still choosing to go home.   States her reason for presentation to ED is to get a tap to release her chest pressure and to help with her breathing but she feels overall better now and would like to leave and would follow-up as an outpatient on Monday 02/23/2021.   Strict return precautions, if symptoms returns were explained in detail to patient and patient's mother and they shows good understanding about it.     Issues for Follow Up:  PCP on Monday GI on Monday   Significant Procedures: None  Significant Labs and Imaging:  Recent Labs  Lab 02/20/21 1331  WBC 10.5  HGB 8.9*  HCT 26.9*  PLT 216   Recent Labs  Lab 02/20/21 1331 02/20/21 2028  NA 129*  --   K 3.4*  --   CL 81*  --   CO2 16*  --   GLUCOSE 94  --   BUN 5*  --   CREATININE 1.02*  --   CALCIUM 8.5*  --   MG  --  1.3*  PHOS  --  3.5  ALKPHOS 371*  --   AST 342*  --   ALT 95*  --   ALBUMIN 2.5*  --     DG Chest 2 View  Result Date: 02/20/2021 CLINICAL DATA:  Shortness of breath. Status post paracentesis. EXAM: CHEST - 2 VIEW COMPARISON:  02/02/2021 FINDINGS: The cardiac silhouette, mediastinal and hilar contours are normal. The lungs are clear.  No pleural effusions. No pneumothorax. The bony thorax is intact. IMPRESSION: No acute cardiopulmonary findings. Electronically Signed   By: Rudie Meyer M.D.   On: 02/20/2021 14:52   CT Abdomen Pelvis W Contrast  Result Date: 02/20/2021 CLINICAL DATA:  Abdominal pain for 2 days EXAM: CT ABDOMEN AND PELVIS WITH CONTRAST TECHNIQUE: Multidetector CT imaging of the abdomen and pelvis was performed using the standard protocol following bolus administration of intravenous contrast. CONTRAST:  75mL OMNIPAQUE IOHEXOL 300 MG/ML  SOLN COMPARISON:  01/15/2021 FINDINGS: Lower chest: Previously seen pleural effusions have resolved in the interval. No focal infiltrate or parenchymal nodule is seen. Hepatobiliary: Diffuse fatty infiltration of the liver is noted. Gallbladder demonstrates some dependent density which may be related to vicarious excretion of contrast or gallbladder sludge. Mild ascites is noted surrounding the liver. Pancreas: Unremarkable. No pancreatic ductal dilatation or surrounding inflammatory changes. Spleen: Normal in size without focal abnormality. Adrenals/Urinary Tract: Adrenal glands are unremarkable. Kidneys demonstrate a normal enhancement pattern. No renal calculi or obstructive changes are noted. Bladder is partially distended. Stomach/Bowel: No obstructive changes of the colon are noted. Decompression of the ascending colon with mild wall thickening is noted. These changes are likely reactive to the underlying ascites and are relatively stable from the prior exam. Small bowel and stomach appear within normal limits without obstructive change. Vascular/Lymphatic: No significant vascular findings are present. No enlarged abdominal or pelvic lymph nodes. Reproductive: Uterus and bilateral adnexa are unremarkable. Other: Mild ascites is noted decreased when compared with the prior CT examination. Musculoskeletal: No acute or significant osseous findings. IMPRESSION: Mild ascites although improved  from the prior exam. Resolution of previously seen pleural effusions. Stable fatty infiltration of the liver. Reactive changes in the colon consistent with the underlying ascites. Electronically Signed   By: Alcide Clever M.D.   On: 02/20/2021 19:37     Results/Tests Pending at Time of Discharge: Patient left AMA  Discharge Medications:  Allergies as of 02/20/2021       Reactions   Latex Itching, Rash        Medication List     STOP taking these medications    potassium chloride SA 20 MEQ tablet Commonly known as: KLOR-CON       TAKE these medications    Adult One Daily Gummies Chew Chew 2 tablets by mouth daily.   albuterol 108 (90 Base) MCG/ACT inhaler Commonly known as: VENTOLIN HFA Inhale 2 puffs into the lungs every 6 (six) hours as needed (asthma attacks).   CLEAR EYES OP Place 1 drop into both eyes 2 (two) times daily.   EPINEPHrine 0.3 mg/0.3 mL Soaj injection Commonly known as: EPI-PEN Inject 0.3 mg into the muscle once as needed for anaphylaxis.   escitalopram 10 MG tablet Commonly known as: LEXAPRO Take 1 tablet (10 mg total) by mouth daily.   folic acid 1 MG tablet Commonly known as: FOLVITE Take 1 tablet (1 mg total) by mouth daily.   furosemide 20 MG tablet Commonly known as: LASIX Take 20 mg by mouth daily.   spironolactone  100 MG tablet Commonly known as: Aldactone Take 1 tablet (100 mg total) by mouth daily.       ASK your doctor about these medications    hydrOXYzine 25 MG tablet Commonly known as: ATARAX/VISTARIL Take 2 tablets (50 mg total) by mouth at bedtime.        Discharge Instructions: Please refer to Patient Instructions section of EMR for full details.  Patient was counseled important signs and symptoms that should prompt return to medical care, changes in medications, dietary instructions, activity restrictions, and follow up appointments.   Follow-Up Appointments: Patient left AMA   Lukis Bunt, Geralynn Rile, MD 02/20/2021,  11:03 PM PGY-1, [facility]

## 2021-02-20 NOTE — Telephone Encounter (Signed)
I spoke with pt's mom and informed her of Dr. Lurline Del message below.

## 2021-02-20 NOTE — Hospital Course (Addendum)
Jeanette Elliott is a 40 y.o. female presenting with generalized weakness, nausea and vomiting . PMH is significant for Cirrhosis, Vit B12 and Iron deficiency anemia, GERD, PTSD, anxiety, HTN, Hep A, ascites, L 9th anterior rib fracture   Patient left AGAINST MEDICAL ADVICE. Brief Hospital course below includes all the problems that were discussed with patient and was encouraged to stay at least overnight.  Patient was tearful and even after being approved multiple times declined to stay in hospital for any further treatment. She presented with abdominal pain most likely due to her liver cirrhosis.  At home she takes Lasix 20 mg and spironolactone 100 mg and last she took was this a.m.  She started having nausea and vomiting and was unable to keep anything down orally and that is why presented to ED.  Received IV fluid and IV Zofran and that helped her feel overall better.  Her MELD score is 23, 19.6% mortality in 3 months.   Explained about ongoing hypokalemia sCr~ 3.4. Patient has recurrent h/o Hypokalemia. She was on 20 meq Na+ at home but recently d/c'd by Dr. Edison Pace due to increase in her Spironolactone 100 mg.  Patient states that this is not new for her and she feels comfortable going back home and will repeat her labs on Monday.  Informed about hyponatremia, Na+ 129 on admission. Also, had hyponatremia during recent admission and one before that. Most likely due to loop diuretics.  Along with it, explained about about ongoing Hypotension, AKI, Transaminitis, Macrocytic anemia, Tachycardia, B/L Leg swelling but patient still choosing to go home.   States her reason for presentation to ED is to get a tap to release her chest pressure and to help with her breathing but she feels overall better now and would like to leave and would follow-up as an outpatient on Monday 02/23/2021.   Strict return precautions, if symptoms returns were explained in detail to patient and patient's mother and they shows good  understanding about it.

## 2021-02-20 NOTE — ED Provider Notes (Signed)
Emergency Medicine Provider Triage Evaluation Note  Jeanette Elliott , a 40 y.o. female  was evaluated in triage.  Pt complains of fluid buildup to her abdomen and lungs. Reports sob and nv for the last few days. She is currently being w/u by [facility] for her liver disease.  Review of Systems  Positive: Abd distension, sob, nv Negative: fever  Physical Exam  BP 103/74 (BP Location: Right Arm)   Pulse (!) 126   Temp 98.5 F (36.9 C) (Oral)   Resp 18   SpO2 100%  Gen:   Awake, no distress   Resp:  Normal effort  MSK:   Moves extremities without difficulty  Other:  Abd distension, jaundice  Medical Decision Making  Medically screening exam initiated at 1:07 PM.  Appropriate orders placed.  Jeanette Elliott was informed that the remainder of the evaluation will be completed by another provider, this initial triage assessment does not replace that evaluation, and the importance of remaining in the ED until their evaluation is complete.   Rodney Booze, PA-C 02/20/21 1313    Fredia Sorrow, MD 02/21/21 [phone]

## 2021-02-20 NOTE — ED Provider Notes (Addendum)
[facility] EMERGENCY DEPARTMENT Provider Note   CSN: [id number] Arrival date & time: 02/20/21  1238     History Chief Complaint  Patient presents with   Abdominal Pain   Emesis    Jeanette Elliott is a 40 y.o. female.  Patient with hx etoh cirrhosis (no current/recent etoh use) presents with nausea/vomiting, decreased appetite, poor po intake, 4-5 lb wt decrease, general abd discomfort in past week. Symptoms acute onset, moderate, persistent, non radiating, dull. Nobloody or bilious emesis. Indicates was supposed to have paracentesis four days ago, but not enough fluid to drain. Mild sob, states due to abd feeling bloated. Is having regular bms, no constipation. Denies fever/chills/sweats. No cough or uri symptoms. No headache. No neck pain or stiffness. Denies dysuria or gu c/o. No vaginal discharge or bleeding.   The history is provided by the patient and a parent.  Abdominal Pain Associated symptoms: nausea and vomiting   Associated symptoms: no chest pain, no chills, no constipation, no cough, no diarrhea, no fever, no shortness of breath and no sore throat   Emesis Associated symptoms: abdominal pain   Associated symptoms: no chills, no cough, no diarrhea, no fever, no headaches and no sore throat       Past Medical History:  Diagnosis Date   Asthma    Elevated liver enzymes    Jaundice    Sjogren's syndrome [facility])     Patient Active Problem List   Diagnosis Date Noted   Abdominal distention    Hyperbilirubinemia    Anxiety    Cirrhosis ([place]) 02/02/2021   Pleural effusion    Protein-calorie malnutrition, severe 01/16/2021   Ascites    S/P thoracentesis    Weight loss, unintentional    Hemochromatosis    Closed fracture of rib of left side with routine healing    Elevated bilirubin    Loss of weight    Macrocytic anemia    Folate deficiency    Jaundice 01/11/2021   Elevated liver enzymes    Fall     Past Surgical History:  Procedure  Laterality Date   IR PARACENTESIS  02/03/2021   IR THORACENTESIS ASP PLEURAL SPACE W/IMG GUIDE  01/16/2021   SUBMANDIBULAR GLAND EXCISION     TONSILLECTOMY       OB History   No obstetric history on file.     Family History  Problem Relation Age of Onset   Colon cancer Neg Hx    Esophageal cancer Neg Hx     Social History   Tobacco Use   Smoking status: Never   Smokeless tobacco: Never  Vaping Use   Vaping Use: Never used  Substance Use Topics   Alcohol use: Not Currently   Drug use: Not Currently    Home Medications Prior to Admission medications   Medication Sig Start Date End Date Taking? Authorizing Provider  albuterol (VENTOLIN HFA) 108 (90 Base) MCG/ACT inhaler Inhale 2 puffs into the lungs every 6 (six) hours as needed (asthma attacks). 01/22/21   Cirigliano, Garvin Fila, DO  EPINEPHrine 0.3 mg/0.3 mL IJ SOAJ injection Inject 0.3 mg into the muscle once as needed for anaphylaxis. 01/22/21   Cirigliano, Mary K, DO  escitalopram (LEXAPRO) 10 MG tablet Take 1 tablet (10 mg total) by mouth daily. 01/22/21   Cirigliano, Garvin Fila, DO  folic acid (FOLVITE) 1 MG tablet Take 1 tablet (1 mg total) by mouth daily. 02/06/21   Cirigliano, Garvin Fila, DO  furosemide (LASIX) 40 MG tablet  Take 1 tablet (40 mg total) by mouth daily. 02/04/21   Sharion Settler, DO  hydrOXYzine (ATARAX/VISTARIL) 25 MG tablet Take 2 tablets (50 mg total) by mouth at bedtime. 01/22/21   Cirigliano, Garvin Fila, DO  Multiple Vitamins-Minerals (ADULT ONE DAILY GUMMIES) CHEW Chew 2 tablets by mouth daily.    [provider]  Naphazoline HCl (CLEAR EYES OP) Place 1 drop into both eyes 2 (two) times daily.    [provider]  potassium chloride SA (KLOR-CON) 20 MEQ tablet Take 2 tablets (40 mEq total) by mouth daily. 02/06/21   Cirigliano, Garvin Fila, DO  spironolactone (ALDACTONE) 100 MG tablet Take 1 tablet (100 mg total) by mouth daily. 02/04/21 04/05/21  Sharion Settler, DO    Allergies    Latex  Review of  Systems   Review of Systems  Constitutional:  Negative for chills and fever.  HENT:  Negative for sore throat.   Eyes:  Negative for redness.  Respiratory:  Negative for cough and shortness of breath.   Cardiovascular:  Negative for chest pain.  Gastrointestinal:  Positive for abdominal pain, nausea and vomiting. Negative for constipation and diarrhea.  Genitourinary:  Negative for flank pain.  Musculoskeletal:  Negative for back pain and neck pain.  Skin:  Negative for rash.  Neurological:  Positive for weakness. Negative for numbness and headaches.  Hematological:  Does not bruise/bleed easily.  Psychiatric/Behavioral:  Negative for confusion.    Physical Exam Updated Vital Signs BP 114/80   Pulse (!) 118   Temp 98.5 F (36.9 C) (Oral)   Resp 15   SpO2 100%   Physical Exam Vitals and nursing note reviewed.  Constitutional:      Appearance: Normal appearance. She is well-developed.  HENT:     Head: Atraumatic.     Nose: Nose normal.     Mouth/Throat:     Mouth: Mucous membranes are moist.     Pharynx: Oropharynx is clear.  Eyes:     General: Scleral icterus present.     Conjunctiva/sclera: Conjunctivae normal.     Pupils: Pupils are equal, round, and reactive to light.  Neck:     Trachea: No tracheal deviation.  Cardiovascular:     Rate and Rhythm: Regular rhythm. Tachycardia present.     Pulses: Normal pulses.     Heart sounds: Normal heart sounds. No murmur heard.   No friction rub. No gallop.  Pulmonary:     Effort: Pulmonary effort is normal. No respiratory distress.     Breath sounds: Normal breath sounds.  Abdominal:     General: Bowel sounds are normal. There is no distension.     Palpations: Abdomen is soft. There is no mass.     Tenderness: There is abdominal tenderness. There is no guarding or rebound.     Hernia: No hernia is present.     Comments: Abd tenderness, mid/diffuse, no peritonitis.   Genitourinary:    Comments: No cva tenderness.   Musculoskeletal:        General: No tenderness.     Cervical back: Normal range of motion and neck supple. No rigidity. No muscular tenderness.     Comments: Mild symmetric foot/ankle edema bilaterally.   Skin:    General: Skin is warm and dry.     Findings: No rash.  Neurological:     Mental Status: She is alert.     Comments: Alert, speech normal. Motor/sens grossly intact.   Psychiatric:  Mood and Affect: Mood normal.    ED Results / Procedures / Treatments   Labs (all labs ordered are listed, but only abnormal results are displayed) Results for orders placed or performed during the hospital encounter of 02/20/21  Resp Panel by RT-PCR (Flu A&B, Covid) Nasopharyngeal Swab   Specimen: Nasopharyngeal Swab; Nasopharyngeal(NP) swabs in vial transport medium  Result Value Ref Range   SARS Coronavirus 2 by RT PCR NEGATIVE NEGATIVE   Influenza A by PCR NEGATIVE NEGATIVE   Influenza B by PCR NEGATIVE NEGATIVE  Comprehensive metabolic panel  Result Value Ref Range   Sodium 129 (L) 135 - 145 mmol/L   Potassium 3.4 (L) 3.5 - 5.1 mmol/L   Chloride 81 (L) 98 - 111 mmol/L   CO2 16 (L) 22 - 32 mmol/L   Glucose, Bld 94 70 - 99 mg/dL   BUN 5 (L) 6 - 20 mg/dL   Creatinine, Ser 1.02 (H) 0.44 - 1.00 mg/dL   Calcium 8.5 (L) 8.9 - 10.3 mg/dL   Total Protein 6.4 (L) 6.5 - 8.1 g/dL   Albumin 2.5 (L) 3.5 - 5.0 g/dL   AST 342 (H) 15 - 41 U/L   ALT 95 (H) 0 - 44 U/L   Alkaline Phosphatase 371 (H) 38 - 126 U/L   Total Bilirubin 8.4 (H) 0.3 - 1.2 mg/dL   GFR, Estimated >60 >60 mL/min   Anion gap 32 (H) 5 - 15  CBC with Differential  Result Value Ref Range   WBC 10.5 4.0 - 10.5 K/uL   RBC 2.50 (L) 3.87 - 5.11 MIL/uL   Hemoglobin 8.9 (L) 12.0 - 15.0 g/dL   HCT 26.9 (L) 36.0 - 46.0 %   MCV 107.6 (H) 80.0 - 100.0 fL   MCH 35.6 (H) 26.0 - 34.0 pg   MCHC 33.1 30.0 - 36.0 g/dL   RDW 15.0 11.5 - 15.5 %   Platelets 216 150 - 400 K/uL   nRBC 0.0 0.0 - 0.2 %   Neutrophils Relative % 73 %    Neutro Abs 7.7 1.7 - 7.7 K/uL   Lymphocytes Relative 10 %   Lymphs Abs 1.1 0.7 - 4.0 K/uL   Monocytes Relative 14 %   Monocytes Absolute 1.4 (H) 0.1 - 1.0 K/uL   Eosinophils Relative 1 %   Eosinophils Absolute 0.1 0.0 - 0.5 K/uL   Basophils Relative 1 %   Basophils Absolute 0.1 0.0 - 0.1 K/uL   Immature Granulocytes 1 %   Abs Immature Granulocytes 0.10 (H) 0.00 - 0.07 K/uL  Ammonia  Result Value Ref Range   Ammonia 38 (H) 9 - 35 umol/L  Lipase, blood  Result Value Ref Range   Lipase 48 11 - 51 U/L  Protime-INR  Result Value Ref Range   Prothrombin Time 15.4 (H) 11.4 - 15.2 seconds   INR 1.2 0.8 - 1.2  I-Stat beta hCG blood, ED  Result Value Ref Range   I-stat hCG, quantitative <5.0 <5 mIU/mL   Comment 3           DG Chest 2 View  Result Date: 02/20/2021 CLINICAL DATA:  Shortness of breath. Status post paracentesis. EXAM: CHEST - 2 VIEW COMPARISON:  02/02/2021 FINDINGS: The cardiac silhouette, mediastinal and hilar contours are normal. The lungs are clear. No pleural effusions. No pneumothorax. The bony thorax is intact. IMPRESSION: No acute cardiopulmonary findings. Electronically Signed   By: Marijo Sanes M.D.   On: 02/20/2021 14:52   DG Chest [facility]  Result Date: 02/02/2021 CLINICAL DATA:  Swelling and edema. Chest pain and shortness of breath. EXAM: PORTABLE CHEST 1 VIEW COMPARISON:  01/16/2021 radiographs and CT FINDINGS: Heart is normal in size. Normal mediastinal contours. Small pleural effusions better appreciated on CT. Streaky basilar atelectasis. No pulmonary edema. No pneumothorax. No confluent consolidation. No acute osseous abnormalities are seen. IMPRESSION: Small pleural effusions and bibasilar atelectasis. Electronically Signed   By: Keith Rake M.D.   On: 02/02/2021 18:48   IR ABDOMEN US LIMITED  Result Date: 02/16/2021 CLINICAL DATA:  40 year old female with a history of ascites. EXAM: LIMITED ABDOMEN ULTRASOUND FOR ASCITES TECHNIQUE: Limited ultrasound  survey for ascites was performed in all four abdominal quadrants. COMPARISON:  None. FINDINGS: Ultrasound survey of the abdomen demonstrates scant ascites. Paracentesis not performed. IMPRESSION: Scant ascites. Electronically Signed   By: Corrie Mckusick D.O.   On: 02/16/2021 16:34   IR Paracentesis  Result Date: 02/03/2021 INDICATION: Patient with history of Sjogren's disease, anemia, fatty liver, elevated liver function tests, ascites; request received for diagnostic and therapeutic paracentesis up to 5 liters. EXAM: ULTRASOUND GUIDED DIAGNOSTIC AND THERAPEUTIC PARACENTESIS MEDICATIONS: 1% lidocaine to skin and subcutaneous tissue COMPLICATIONS: None immediate. PROCEDURE: Informed written consent was obtained from the patient after a discussion of the risks, benefits and alternatives to treatment. A timeout was performed prior to the initiation of the procedure. Initial ultrasound scanning demonstrates a small to moderate amount of ascites within the left mid to lower abdominal quadrant. The left mid to lower abdomen was prepped and draped in the usual sterile fashion. 1% lidocaine was used for local anesthesia. Following this, a 19 gauge, 7-cm, Yueh catheter was introduced. An ultrasound image was saved for documentation purposes. The paracentesis was performed. The catheter was removed and a dressing was applied. The patient tolerated the procedure well without immediate post procedural complication. FINDINGS: A total of approximately 2.4 liters of hazy, amber fluid was removed. Samples were sent to the laboratory as requested by the clinical team. IMPRESSION: Successful ultrasound-guided diagnostic and therapeutic paracentesis yielding 2.4 liters of peritoneal fluid. Read by: Rowe Robert, PA-C Electronically Signed   By: Corrie Mckusick D.O.   On: 02/03/2021 11:23     EKG EKG Interpretation  Date/Time:  Friday February 20 2021 12:55:03 EDT Ventricular Rate:  115 PR Interval:  146 QRS Duration: 68 QT  Interval:  322 QTC Calculation: 445 R Axis:   66 Text Interpretation: Sinus tachycardia Low voltage QRS Nonspecific T wave abnormality Confirmed by Lajean Saver [phone]) on 02/20/2021 5:25:29 PM  Radiology DG Chest 2 View  Result Date: 02/20/2021 CLINICAL DATA:  Shortness of breath. Status post paracentesis. EXAM: CHEST - 2 VIEW COMPARISON:  02/02/2021 FINDINGS: The cardiac silhouette, mediastinal and hilar contours are normal. The lungs are clear. No pleural effusions. No pneumothorax. The bony thorax is intact. IMPRESSION: No acute cardiopulmonary findings. Electronically Signed   By: Marijo Sanes M.D.   On: 02/20/2021 14:52    Procedures Procedures   Medications Ordered in ED Medications  sodium chloride 0.9 % bolus 1,000 mL (has no administration in time range)  ondansetron (ZOFRAN) injection 4 mg (has no administration in time range)    ED Course  I have reviewed the triage vital signs and the nursing notes.  Pertinent labs & imaging results that were available during my care of the patient were reviewed by me and considered in my medical decision making (see chart for details).    MDM Rules/Calculators/A&P  Iv ns bolus. Zofran iv. Labs. Imaging.  Reviewed nursing notes and prior charts for additional history. Recent abd u/s with insufficient fluid to tap.  Labs reviewed/interpreted by me - na mildly low. Hco3 newly low ?volume depletion/dehydration. ?whether possibly related to increased diuretic/med changes during recent admission. Iv ns bolus.  Cr increased from prior.   CXR reviewed/interpreted by me - no pna.   CT pending.  CT reviewed/interpreted by me - no sbo/acute process.   Given weakness, ?volume depletion (new low Na, low Hc03), FTT - will admit for ivf, obs.   Hospitalists consulted for admission (as pts doctor is with [facility]). Discussed pt - they indicates as pt d/c from [place] 3 weeks ago, that any admit < 1 month goes to  prior service - FP consulted for admission.        Final Clinical Impression(s) / ED Diagnoses Final diagnoses:  None    Rx / DC Orders ED Discharge Orders     None           Lajean Saver, MD 02/20/21 2013

## 2021-02-20 NOTE — Telephone Encounter (Signed)
Pt returning call. Please call back [phone].

## 2021-02-20 NOTE — ED Notes (Signed)
Provider called this RN notify that pt would like to leave AMA and does not wish to be admitted to facility.Pt confirms understanding of risks of leaving prior to further treatment and observation. AMA reviewed with pt, electronically signed and witnessed by this RN.

## 2021-02-20 NOTE — ED Triage Notes (Signed)
Pt arrives for eval of n/v and abdominal pain x 2 days in addition to shob r/t fluid in abdomen and around lungs. Reports was scheduled for paracentesis Monday but was unable to be completed d/t the low amount of fluid.

## 2021-02-23 NOTE — Telephone Encounter (Signed)
Inbound call from patient's mother, Loletha Carrow requesting a call please at [phone].

## 2021-02-23 NOTE — Telephone Encounter (Signed)
Contacted the patients mother back and gave her Dr De Hollingshead suggestions verbatim. Patients mother verbalized understanding and she is going to try and get the patient to step up on her nutrition.

## 2021-02-23 NOTE — Telephone Encounter (Signed)
Spoke with the patients mother and she was tearful and stated that Jeanette Elliott was again at the ED this weekend. She is concerned with how her daughter is so weak and in a lot of pain. She would like to know what can be done medication wise or anything else to help Jeanette Elliott, she is at a loss. Jeanette Elliott has stopped eating,and she barely drinks. Please advise?

## 2021-02-24 ENCOUNTER — Encounter (HOSPITAL_COMMUNITY): Payer: Self-pay

## 2021-02-24 ENCOUNTER — Emergency Department (HOSPITAL_COMMUNITY): Payer: 59

## 2021-02-24 ENCOUNTER — Inpatient Hospital Stay (HOSPITAL_COMMUNITY)
Admission: EM | Admit: 2021-02-24 | Discharge: 2021-02-27 | DRG: 641 | Disposition: A | Discharge status: Home / Self Care | Payer: 59 | Attending: Family Medicine | Admitting: Family Medicine

## 2021-02-24 ENCOUNTER — Other Ambulatory Visit: Payer: Self-pay | Admitting: Family Medicine

## 2021-02-24 ENCOUNTER — Other Ambulatory Visit: Payer: Self-pay

## 2021-02-24 DIAGNOSIS — F419 Anxiety disorder, unspecified: Secondary | ICD-10-CM

## 2021-02-24 DIAGNOSIS — K7460 Unspecified cirrhosis of liver: Secondary | ICD-10-CM | POA: Diagnosis present

## 2021-02-24 DIAGNOSIS — D539 Nutritional anemia, unspecified: Secondary | ICD-10-CM | POA: Diagnosis present

## 2021-02-24 DIAGNOSIS — T502X5A Adverse effect of carbonic-anhydrase inhibitors, benzothiadiazides and other diuretics, initial encounter: Secondary | ICD-10-CM

## 2021-02-24 DIAGNOSIS — R531 Weakness: Secondary | ICD-10-CM

## 2021-02-24 DIAGNOSIS — D509 Iron deficiency anemia, unspecified: Secondary | ICD-10-CM | POA: Diagnosis present

## 2021-02-24 DIAGNOSIS — R634 Abnormal weight loss: Secondary | ICD-10-CM | POA: Diagnosis present

## 2021-02-24 DIAGNOSIS — Z91040 Latex allergy status: Secondary | ICD-10-CM

## 2021-02-24 DIAGNOSIS — R17 Unspecified jaundice: Secondary | ICD-10-CM

## 2021-02-24 DIAGNOSIS — F4310 Post-traumatic stress disorder, unspecified: Secondary | ICD-10-CM | POA: Diagnosis present

## 2021-02-24 DIAGNOSIS — K760 Fatty (change of) liver, not elsewhere classified: Secondary | ICD-10-CM | POA: Diagnosis present

## 2021-02-24 DIAGNOSIS — L299 Pruritus, unspecified: Secondary | ICD-10-CM | POA: Diagnosis present

## 2021-02-24 DIAGNOSIS — Z7682 Awaiting organ transplant status: Secondary | ICD-10-CM

## 2021-02-24 DIAGNOSIS — E46 Unspecified protein-calorie malnutrition: Secondary | ICD-10-CM | POA: Diagnosis present

## 2021-02-24 DIAGNOSIS — K30 Functional dyspepsia: Secondary | ICD-10-CM

## 2021-02-24 DIAGNOSIS — R9431 Abnormal electrocardiogram [ECG] [EKG]: Secondary | ICD-10-CM | POA: Diagnosis present

## 2021-02-24 DIAGNOSIS — M3500 Sicca syndrome, unspecified: Secondary | ICD-10-CM | POA: Diagnosis present

## 2021-02-24 DIAGNOSIS — E860 Dehydration: Secondary | ICD-10-CM | POA: Diagnosis present

## 2021-02-24 DIAGNOSIS — E43 Unspecified severe protein-calorie malnutrition: Secondary | ICD-10-CM | POA: Diagnosis present

## 2021-02-24 DIAGNOSIS — Z6821 Body mass index (BMI) 21.0-21.9, adult: Secondary | ICD-10-CM

## 2021-02-24 DIAGNOSIS — I959 Hypotension, unspecified: Secondary | ICD-10-CM | POA: Diagnosis present

## 2021-02-24 DIAGNOSIS — I5032 Chronic diastolic (congestive) heart failure: Secondary | ICD-10-CM | POA: Diagnosis present

## 2021-02-24 DIAGNOSIS — E876 Hypokalemia: Principal | ICD-10-CM

## 2021-02-24 DIAGNOSIS — K7031 Alcoholic cirrhosis of liver with ascites: Secondary | ICD-10-CM

## 2021-02-24 DIAGNOSIS — B159 Hepatitis A without hepatic coma: Secondary | ICD-10-CM | POA: Diagnosis present

## 2021-02-24 DIAGNOSIS — K5900 Constipation, unspecified: Secondary | ICD-10-CM

## 2021-02-24 DIAGNOSIS — Z79899 Other long term (current) drug therapy: Secondary | ICD-10-CM

## 2021-02-24 DIAGNOSIS — K219 Gastro-esophageal reflux disease without esophagitis: Secondary | ICD-10-CM | POA: Diagnosis present

## 2021-02-24 DIAGNOSIS — E871 Hypo-osmolality and hyponatremia: Secondary | ICD-10-CM | POA: Diagnosis present

## 2021-02-24 DIAGNOSIS — Z20822 Contact with and (suspected) exposure to covid-19: Secondary | ICD-10-CM | POA: Diagnosis present

## 2021-02-24 DIAGNOSIS — I110 Hypertensive heart disease with heart failure: Secondary | ICD-10-CM | POA: Diagnosis present

## 2021-02-24 LAB — CBC WITH DIFFERENTIAL/PLATELET
Abs Immature Granulocytes: 0.06 10*3/uL (ref 0.00–0.07)
Basophils Absolute: 0.1 10*3/uL (ref 0.0–0.1)
Basophils Relative: 1 %
Eosinophils Absolute: 0.1 10*3/uL (ref 0.0–0.5)
Eosinophils Relative: 1 %
HCT: 24.7 % — ABNORMAL LOW (ref 36.0–46.0)
Hemoglobin: 8.4 g/dL — ABNORMAL LOW (ref 12.0–15.0)
Immature Granulocytes: 1 %
Lymphocytes Relative: 26 %
Lymphs Abs: 2.3 10*3/uL (ref 0.7–4.0)
MCH: 34.6 pg — ABNORMAL HIGH (ref 26.0–34.0)
MCHC: 34 g/dL (ref 30.0–36.0)
MCV: 101.6 fL — ABNORMAL HIGH (ref 80.0–100.0)
Monocytes Absolute: 1.1 10*3/uL — ABNORMAL HIGH (ref 0.1–1.0)
Monocytes Relative: 12 %
Neutro Abs: 5.3 10*3/uL (ref 1.7–7.7)
Neutrophils Relative %: 59 %
Platelets: 244 10*3/uL (ref 150–400)
RBC: 2.43 MIL/uL — ABNORMAL LOW (ref 3.87–5.11)
RDW: 14.9 % (ref 11.5–15.5)
WBC: 8.9 10*3/uL (ref 4.0–10.5)
nRBC: 0 % (ref 0.0–0.2)

## 2021-02-24 LAB — COMPREHENSIVE METABOLIC PANEL
ALT: 52 U/L — ABNORMAL HIGH (ref 0–44)
AST: 106 U/L — ABNORMAL HIGH (ref 15–41)
Albumin: 2.5 g/dL — ABNORMAL LOW (ref 3.5–5.0)
Alkaline Phosphatase: 288 U/L — ABNORMAL HIGH (ref 38–126)
Anion gap: 15 (ref 5–15)
BUN: <5 mg/dL — ABNORMAL LOW (ref 6–20)
CO2: 33 mmol/L — ABNORMAL HIGH (ref 22–32)
Calcium: 9.1 mg/dL (ref 8.9–10.3)
Chloride: 80 mmol/L — ABNORMAL LOW (ref 98–111)
Creatinine, Ser: 0.87 mg/dL (ref 0.44–1.00)
GFR, Estimated: >60 mL/min (ref >60)
Glucose, Bld: 91 mg/dL (ref 70–99)
Potassium: 2.6 mmol/L — CL (ref 3.5–5.1)
Sodium: 128 mmol/L — ABNORMAL LOW (ref 135–145)
Total Bilirubin: 5.5 mg/dL — ABNORMAL HIGH (ref 0.3–1.2)
Total Protein: 6.3 g/dL — ABNORMAL LOW (ref 6.5–8.1)

## 2021-02-24 LAB — AMMONIA: Ammonia: 54 umol/L — ABNORMAL HIGH (ref 9–35)

## 2021-02-24 LAB — PROTIME-INR
INR: 1.2 (ref 0.8–1.2)
Prothrombin Time: 15.3 seconds — ABNORMAL HIGH (ref 11.4–15.2)

## 2021-02-24 LAB — RESP PANEL BY RT-PCR (FLU A&B, COVID) ARPGX2
Influenza A by PCR: NEGATIVE
Influenza B by PCR: NEGATIVE
SARS Coronavirus 2 by RT PCR: NEGATIVE

## 2021-02-24 LAB — LIPASE, BLOOD: Lipase: 62 U/L — ABNORMAL HIGH (ref 11–51)

## 2021-02-24 MED ORDER — FUROSEMIDE 20 MG PO TABS
20.0000 mg | ORAL_TABLET | Freq: Every day | ORAL | Status: DC
  Administered 2021-02-25 – 2021-02-27 (×3): 20 mg via ORAL
  Filled 2021-02-24 (×3): qty 1

## 2021-02-24 MED ORDER — METOCLOPRAMIDE HCL 5 MG/ML IJ SOLN
5.0000 mg | Freq: Once | INTRAMUSCULAR | Status: AC
  Administered 2021-02-24: 5 mg via INTRAVENOUS
  Filled 2021-02-24: qty 2

## 2021-02-24 MED ORDER — HYDROXYZINE HCL 25 MG PO TABS: 25.0000 mg | ORAL_TABLET | Freq: Three times a day (TID) | ORAL | Status: DC | PRN

## 2021-02-24 MED ORDER — ENOXAPARIN SODIUM 40 MG/0.4ML IJ SOSY
40.0000 mg | PREFILLED_SYRINGE | INTRAMUSCULAR | Status: DC
  Administered 2021-02-24 – 2021-02-26 (×3): 40 mg via SUBCUTANEOUS
  Filled 2021-02-24 (×3): qty 0.4

## 2021-02-24 MED ORDER — SPIRONOLACTONE 100 MG PO TABS
100.0000 mg | ORAL_TABLET | Freq: Every day | ORAL | Status: DC
  Filled 2021-02-24: qty 1

## 2021-02-24 MED ORDER — MIDODRINE HCL 5 MG PO TABS
5.0000 mg | ORAL_TABLET | Freq: Three times a day (TID) | ORAL | Status: DC
  Administered 2021-02-25 – 2021-02-26 (×4): 5 mg via ORAL
  Filled 2021-02-24 (×4): qty 1

## 2021-02-24 MED ORDER — ESCITALOPRAM OXALATE 10 MG PO TABS
10.0000 mg | ORAL_TABLET | Freq: Every day | ORAL | Status: DC
  Administered 2021-02-25 – 2021-02-27 (×3): 10 mg via ORAL
  Filled 2021-02-24 (×3): qty 1

## 2021-02-24 MED ORDER — POTASSIUM CHLORIDE 10 MEQ/100ML IV SOLN
10.0000 meq | INTRAVENOUS | Status: AC
  Administered 2021-02-24 – 2021-02-25 (×6): 10 meq via INTRAVENOUS
  Filled 2021-02-24 (×6): qty 100

## 2021-02-24 MED ORDER — POTASSIUM CHLORIDE CRYS ER 20 MEQ PO TBCR
40.0000 meq | EXTENDED_RELEASE_TABLET | Freq: Once | ORAL | Status: AC
  Administered 2021-02-24: 40 meq via ORAL
  Filled 2021-02-24: qty 2

## 2021-02-24 MED ORDER — FOLIC ACID 1 MG PO TABS
1.0000 mg | ORAL_TABLET | Freq: Every day | ORAL | Status: DC
  Administered 2021-02-25 – 2021-02-27 (×3): 1 mg via ORAL
  Filled 2021-02-24 (×3): qty 1

## 2021-02-24 MED ORDER — HYDROXYZINE HCL 25 MG PO TABS: 50.0000 mg | ORAL_TABLET | Freq: Every day | ORAL | Status: DC

## 2021-02-24 MED ORDER — NAPHAZOLINE-GLYCERIN 0.012-0.25 % OP SOLN
1.0000 [drp] | Freq: Two times a day (BID) | OPHTHALMIC | Status: DC
  Administered 2021-02-25 – 2021-02-27 (×2): 1 [drp] via OPHTHALMIC
  Filled 2021-02-24: qty 15

## 2021-02-24 MED ORDER — HYDROXYZINE HCL 25 MG PO TABS: 50.0000 mg | ORAL_TABLET | Freq: Three times a day (TID) | ORAL | Status: DC | PRN

## 2021-02-24 NOTE — Telephone Encounter (Signed)
Please see message and advise.  Thank you. Last OV 01/22/21 Last fill 02/06/21  #28/0

## 2021-02-24 NOTE — ED Provider Notes (Signed)
[facility] EMERGENCY DEPARTMENT Provider Note   CSN: [id number] Arrival date & time: 02/24/21  0836     History Chief Complaint  Patient presents with   Shortness of Breath   Abdominal Pain    Jeanette Elliott is a 40 y.o. female with a past medical history of cirrhosis, followed by [facility] for possibility of transplant, who presents today for evaluation of feeling poorly. She was seen here 4 days ago and left AMA. She has reportedly been in contact with her [facility] team over the weekend and they recommended that she come in for evaluation. Patient reports ongoing abdominal pain that is unchanged and shortness of breath.  She states that both of these are unchanged from when she left AMA. She has had nausea without vomiting and generally poor appetite.  No fevers.    HPI     Past Medical History:  Diagnosis Date   Asthma    Elevated liver enzymes    Jaundice    Sjogren's syndrome [facility])     Patient Active Problem List   Diagnosis Date Noted   Generalized weakness 02/20/2021   Abdominal distention    Hyperbilirubinemia    Anxiety    Cirrhosis ([place]) 02/02/2021   Pleural effusion    Protein-calorie malnutrition, severe 01/16/2021   Ascites    S/P thoracentesis    Weight loss, unintentional    Hemochromatosis    Closed fracture of rib of left side with routine healing    Elevated bilirubin    Loss of weight    Macrocytic anemia    Folate deficiency    Jaundice 01/11/2021   Elevated liver enzymes    Fall     Past Surgical History:  Procedure Laterality Date   IR PARACENTESIS  02/03/2021   IR THORACENTESIS ASP PLEURAL SPACE W/IMG GUIDE  01/16/2021   SUBMANDIBULAR GLAND EXCISION     TONSILLECTOMY       OB History   No obstetric history on file.     Family History  Problem Relation Age of Onset   Colon cancer Neg Hx    Esophageal cancer Neg Hx     Social History   Tobacco Use   Smoking status: Never   Smokeless tobacco: Never  Vaping Use    Vaping Use: Never used  Substance Use Topics   Alcohol use: Not Currently   Drug use: Not Currently    Home Medications Prior to Admission medications   Medication Sig Start Date End Date Taking? Authorizing Provider  albuterol (VENTOLIN HFA) 108 (90 Base) MCG/ACT inhaler Inhale 2 puffs into the lungs every 6 (six) hours as needed (asthma attacks). 01/22/21   Cirigliano, Garvin Fila, DO  EPINEPHrine 0.3 mg/0.3 mL IJ SOAJ injection Inject 0.3 mg into the muscle once as needed for anaphylaxis. 01/22/21   Cirigliano, Mary K, DO  escitalopram (LEXAPRO) 10 MG tablet Take 1 tablet (10 mg total) by mouth daily. 01/22/21   Cirigliano, Garvin Fila, DO  folic acid (FOLVITE) 1 MG tablet Take 1 tablet (1 mg total) by mouth daily. 02/06/21   Cirigliano, Garvin Fila, DO  furosemide (LASIX) 20 MG tablet Take 20 mg by mouth daily.    [provider]  hydrOXYzine (ATARAX/VISTARIL) 25 MG tablet Take 2 tablets (50 mg total) by mouth at bedtime. Patient taking differently: Take 50 mg by mouth daily. 01/22/21   Cirigliano, Garvin Fila, DO  Multiple Vitamins-Minerals (ADULT ONE DAILY GUMMIES) CHEW Chew 2 tablets by mouth daily.  [provider]  Naphazoline HCl (CLEAR EYES OP) Place 1 drop into both eyes 2 (two) times daily.    [provider]  spironolactone (ALDACTONE) 100 MG tablet Take 1 tablet (100 mg total) by mouth daily. 02/04/21 04/05/21  Sharion Settler, DO  potassium chloride SA (KLOR-CON) 20 MEQ tablet Take 2 tablets (40 mEq total) by mouth daily. 02/06/21 02/20/21  Ronnald Nian, DO    Allergies    Latex  Review of Systems   Review of Systems  Constitutional:  Positive for appetite change and fatigue.  Respiratory:  Positive for shortness of breath. Negative for cough.   Cardiovascular:  Positive for leg swelling. Negative for chest pain.  Gastrointestinal:  Positive for abdominal distention, abdominal pain and nausea. Negative for constipation, diarrhea and vomiting.   Genitourinary:  Negative for dysuria.  Musculoskeletal:  Negative for back pain and neck pain.  Skin:  Positive for color change.  Neurological:  Positive for weakness and headaches. Negative for seizures.  Psychiatric/Behavioral:  Negative for confusion. The patient is nervous/anxious.   All other systems reviewed and are negative.  Physical Exam Updated Vital Signs BP 92/68 (BP Location: Left Arm)   Pulse 95   Temp 98.3 F (36.8 C) (Oral)   Resp 18   SpO2 100%   Physical Exam Vitals and nursing note reviewed.  Constitutional:      General: She is not in acute distress.    Appearance: She is ill-appearing. She is not diaphoretic.  HENT:     Head: Normocephalic and atraumatic.  Eyes:     General: No scleral icterus.       Right eye: No discharge.        Left eye: No discharge.     Comments: Jaundiced  Cardiovascular:     Rate and Rhythm: Normal rate and regular rhythm.  Pulmonary:     Effort: Pulmonary effort is normal. No respiratory distress.     Breath sounds: Normal breath sounds. No stridor. No decreased breath sounds or wheezing.  Abdominal:     General: There is no distension.     Palpations: Abdomen is soft.     Tenderness: There is no abdominal tenderness. There is no guarding.  Musculoskeletal:        General: No deformity.     Cervical back: Normal range of motion.     Right lower leg: No tenderness. Edema present.     Left lower leg: No tenderness. Edema present.  Skin:    General: Skin is warm and dry.     Comments: Jaundice  Neurological:     Mental Status: She is alert.     Motor: No abnormal muscle tone.  Psychiatric:        Behavior: Behavior normal.    ED Results / Procedures / Treatments   Labs (all labs ordered are listed, but only abnormal results are displayed) Labs Reviewed  COMPREHENSIVE METABOLIC PANEL - Abnormal; Notable for the following components:      Result Value   Sodium 128 (*)    Potassium 2.6 (*)    Chloride 80 (*)     CO2 33 (*)    BUN <5 (*)    Total Protein 6.3 (*)    Albumin 2.5 (*)    AST 106 (*)    ALT 52 (*)    Alkaline Phosphatase 288 (*)    Total Bilirubin 5.5 (*)    All other components within normal limits  LIPASE, BLOOD - Abnormal;  Notable for the following components:   Lipase 62 (*)    All other components within normal limits  CBC WITH DIFFERENTIAL/PLATELET - Abnormal; Notable for the following components:   RBC 2.43 (*)    Hemoglobin 8.4 (*)    HCT 24.7 (*)    MCV 101.6 (*)    MCH 34.6 (*)    Monocytes Absolute 1.1 (*)    All other components within normal limits  PROTIME-INR - Abnormal; Notable for the following components:   Prothrombin Time 15.3 (*)    All other components within normal limits  RESP PANEL BY RT-PCR (FLU A&B, COVID) ARPGX2  MAGNESIUM  AMMONIA    EKG None  Radiology DG Chest 2 View  Result Date: 02/24/2021 CLINICAL DATA:  40 year old female with shortness of breath. History of ascites. EXAM: CHEST - 2 VIEW COMPARISON:  CT Abdomen and Pelvis 02/20/2021. Chest radiographs 02/20/2021 and earlier. FINDINGS: Lung volumes and mediastinal contours remain normal. Visualized tracheal air column is within normal limits. Mild EKG button artifact in the left lung, small right anterior upper chest wall piercing. No pneumothorax, pulmonary edema, pleural effusion or confluent pulmonary opacity. No acute osseous abnormality identified. Negative visible bowel gas pattern. IMPRESSION: No acute cardiopulmonary abnormality. Electronically Signed   By: Genevie Ann M.D.   On: 02/24/2021 10:21    Procedures Procedures   Medications Ordered in ED Medications  potassium chloride SA (KLOR-CON) CR tablet 40 mEq (has no administration in time range)  potassium chloride 10 mEq in 100 mL IVPB (has no administration in time range)  metoCLOPramide (REGLAN) injection 5 mg (has no administration in time range)  hydrOXYzine (ATARAX/VISTARIL) tablet 50 mg (has no administration in time range)     ED Course  I have reviewed the triage vital signs and the nursing notes.  Pertinent labs & imaging results that were available during my care of the patient were reviewed by me and considered in my medical decision making (see chart for details).    MDM Rules/Calculators/A&P                          Patient is a 40 year old woman who presents today for evaluation of ongoing shortness of breath, abdominal pain and overall weakness. She was here and left AMA 4 days ago.  Patient is accompanied by her mother today, and with the patient's permission I had extensive discussion with patient and her mother regarding how sick patient is, along with the need to adhere to recommended medical treatments including the importance of not leaving AMA when frustrated. Patient and her mother both state understanding of this and thanked me for taking the time to have this discussion with them.  When compared to her labs from 4 days ago patient is now significantly hypokalemic with a potassium of 2.6.  It sounds like she has poor p.o. intake and is drinking some water and eat having about 1 protein shake a day. Patient's total bili is slightly improved from 4 days ago.  Her lipase is slightly elevated at 62. COVID testing, magnesium, and pneumonia is ordered. Both p.o. and IV potassium replacement as ordered along with low-dose IV Reglan to try and help with her chronic headache and her nausea. Doubt SBP, she is afebrile without a significant leukocytosis and her abdomen is not distended or significantly tender on palpation. He had CT scan obtained 4 days ago, given that her symptoms have not changed I do not see a reason  to repeat the scan today.  Atarax as ordered as needed for anxiety.  I spoke with family medicine teaching service as they had been the ones to admit patient 4 days ago.  They will see her for admission.  Note: Portions of this report may have been transcribed using voice recognition  software. Every effort was made to ensure accuracy; however, inadvertent computerized transcription errors may be present   Final Clinical Impression(s) / ED Diagnoses Final diagnoses:  Alcoholic cirrhosis of liver with ascites ([place])  Jaundice  Elevated bilirubin  Generalized weakness  Hypokalemia    Rx / DC Orders ED Discharge Orders     None        Lorin Glass, PA-C 02/24/21 2310    Margette Fast, MD 02/25/21 1006

## 2021-02-24 NOTE — Discharge Instructions (Addendum)
Dear Ms. Jeanette Elliott,  Please show up for your [facility] appointment for your CBC and BMP check early in the morning.   Dr. Demaris Callander

## 2021-02-24 NOTE — ED Triage Notes (Addendum)
Pt presents with SOB and ongoing liver issues./ pt seen here Friday for the same but left after waiting 6 hours. Pt is followed by Dr. Edison Pace at [facility] for her liver concerns. Dr. Edison Pace has recommended pt come to ED for dehydration and observation.

## 2021-02-24 NOTE — Hospital Course (Addendum)
Jeanette Elliott is a 40 y.o. female presenting with feeling poorly, found to have significant hypokalemia. PMH is significant for Cirrhosis, vitamin B12 and iron deficiency anemia, GERD, PTSD, anxiety, HTN, Hep A, ascites, L 9th anterior rib fracture.   Hypokalemia with prolonged Qtc  Patient initially presented to be admitted 4 days ago with hypokalemia but left AMA. Now presenting again for dehydration and observation.  Patient has been nauseous with poor p.o. intake. EKG with prolonged Qtc ~673.  Lipase highly elevated at 62, total bili 5.4 (which is improved from 6/10 when it was 8.4), ammonia elevated at 54, sodium 128. Previously on potassium supplement, but was discontinued when spironolactone dose was increased on 6/8 to 100 mg.  Patient and mother reports that she was only taking 50 mg of the spironolactone, and given that  her potassium supplement was stopped, it is likely that this medication confusion contributed to current hypokalemia.  Potassium improved to 3.5 and repeat EKG shows QTC 498.  Medications confirmed with Dr. Edison Pace at [facility] and she should be taking Lasix 20 and spironolactone 100 mg.    Liver cirrhosis  Bilateral lower extremity edema Patient is being followed by [facility] transplant team but per note on 6/8 she is not technically a transplant patient, possibly due to the alcohol component of her liver disease. Family states that they are on the transplant list, so we are unsure of her current official status. Patient stated that she was told by her [facility] providers if she did not comply with medical advice then she would no longer be a candidate for transplant.  Physical exam with jaundice and scleral icterus, abdominal distention without fluid wave and bilateral lower pitting edema.  Current MELD-Na score 22 (7-10% 90 day mortality).  Ammonia slightly elevated at 54, PT/INR 15.3/1.2.  Home medications include Lasix 20 mg, spironolactone 100 mg.  Lower extremity edema was much improved during  hospitalization. [facility] suggested for albumin dose before IV lasix to help her diurese well. After re-evaluation patient d/c'd on lasix 20 and spironolactone 50 and Kcl 20 meq.    Hypotension BP on admission 94/67, patient states that she has some weakness and that she was started on midodrine.  On 5/31 there is documentation that patient was supposed to start midodrine 5 mg 3 times daily for lower blood pressures and Lasix dose was decreased to 20 mg daily.  On the med rec midodrine was not listed as a home medication, we discussed with her mother who reported she is taking Midodrine 51m TID, called pharmacy to confirm. Increased to 10 mg TID during hospitalization for better blood pressure    Hyponatremia-Improved Sodium 128 in the setting of liver cirrhosis.  During last hospitalization, patient was chronically in the low 130s.  Patient has poor p.o. intake and malnutrition. Improved during hospitalization   Constipation in the setting of poor oral intake No bowel movement in 2 days, poor oral intake. No report of pain or straining with bowel movements, though tender in the LLQ.  Started on MiraLAX as needed     Visual acuity Patient complains of decreased visual acuity which is chronic for her and takes vitamins A an outpatient and is advised not to drive.  Restarted on vitamin a and vision improved during hospitalization.  Recent weight loss Most likely due to decreased oral intake and Lasix use.  Nutritional consult to help patient eat better and for educational purposes.
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
## 2021-02-24 NOTE — ED Provider Notes (Signed)
Emergency Medicine Provider Triage Evaluation Note  Jeanette Elliott , a 40 y.o. female  was evaluated in triage.  Pt complains of shortness of breath and ongoing liver issues.  Seen over the weekend, followed by Dr. Edison Pace at [facility] for liver failure.  Was sent here with concern for dehydration.  Patient denies abdominal swelling, has had to have paracentesis once before.  No fevers or chills.  No vomiting.  Reports feeling short of breath and generally fatigued.  Review of Systems  Positive: Shortness of breath, fatigue, abdominal pain Negative: Fevers, chest pain  Physical Exam  BP 106/72 (BP Location: Right Arm)   Pulse 99   Temp 98.6 F (37 C) (Oral)   Resp 18   SpO2 99%  Gen:   Awake, no distress, chronically ill apearring Resp:  Normal effort  MSK:   Moves extremities without difficulty  Other:  Jaundice, some mild LUQ abdominal tenderness no distention  Medical Decision Making  Medically screening exam initiated at 9:34 AM.  Appropriate orders placed.  [name] was informed that the remainder of the evaluation will be completed by another provider, this initial triage assessment does not replace that evaluation, and the importance of remaining in the ED until their evaluation is complete.     Jacqlyn Larsen, PA-C 02/24/21 0941    Carmin Muskrat, MD 02/26/21 [phone]

## 2021-02-25 DIAGNOSIS — K7031 Alcoholic cirrhosis of liver with ascites: Secondary | ICD-10-CM

## 2021-02-25 DIAGNOSIS — R188 Other ascites: Secondary | ICD-10-CM

## 2021-02-25 DIAGNOSIS — D539 Nutritional anemia, unspecified: Secondary | ICD-10-CM

## 2021-02-25 DIAGNOSIS — R17 Unspecified jaundice: Secondary | ICD-10-CM

## 2021-02-25 DIAGNOSIS — R531 Weakness: Secondary | ICD-10-CM

## 2021-02-25 DIAGNOSIS — R634 Abnormal weight loss: Secondary | ICD-10-CM

## 2021-02-25 DIAGNOSIS — K7460 Unspecified cirrhosis of liver: Secondary | ICD-10-CM

## 2021-02-25 DIAGNOSIS — K5900 Constipation, unspecified: Secondary | ICD-10-CM | POA: Diagnosis not present

## 2021-02-25 DIAGNOSIS — F419 Anxiety disorder, unspecified: Secondary | ICD-10-CM | POA: Diagnosis not present

## 2021-02-25 DIAGNOSIS — E876 Hypokalemia: Secondary | ICD-10-CM | POA: Diagnosis not present

## 2021-02-25 DIAGNOSIS — E43 Unspecified severe protein-calorie malnutrition: Secondary | ICD-10-CM

## 2021-02-25 LAB — BASIC METABOLIC PANEL
Anion gap: 15 (ref 5–15)
Anion gap: 10 (ref 5–15)
Anion gap: 10 (ref 5–15)
BUN: <5 mg/dL — ABNORMAL LOW (ref 6–20)
BUN: <5 mg/dL — ABNORMAL LOW (ref 6–20)
BUN: <5 mg/dL — ABNORMAL LOW (ref 6–20)
CO2: 30 mmol/L (ref 22–32)
CO2: 29 mmol/L (ref 22–32)
CO2: 31 mmol/L (ref 22–32)
Calcium: 8.4 mg/dL — ABNORMAL LOW (ref 8.9–10.3)
Calcium: 8.2 mg/dL — ABNORMAL LOW (ref 8.9–10.3)
Calcium: 8.4 mg/dL — ABNORMAL LOW (ref 8.9–10.3)
Chloride: 85 mmol/L — ABNORMAL LOW (ref 98–111)
Chloride: 89 mmol/L — ABNORMAL LOW (ref 98–111)
Chloride: 89 mmol/L — ABNORMAL LOW (ref 98–111)
Creatinine, Ser: 0.85 mg/dL (ref 0.44–1.00)
Creatinine, Ser: 0.71 mg/dL (ref 0.44–1.00)
Creatinine, Ser: 0.75 mg/dL (ref 0.44–1.00)
GFR, Estimated: >60 mL/min (ref >60)
GFR, Estimated: >60 mL/min (ref >60)
GFR, Estimated: >60 mL/min (ref >60)
Glucose, Bld: 62 mg/dL — ABNORMAL LOW (ref 70–99)
Glucose, Bld: 90 mg/dL (ref 70–99)
Glucose, Bld: 91 mg/dL (ref 70–99)
Potassium: 3.5 mmol/L (ref 3.5–5.1)
Potassium: 3.9 mmol/L (ref 3.5–5.1)
Potassium: 3.7 mmol/L (ref 3.5–5.1)
Sodium: 130 mmol/L — ABNORMAL LOW (ref 135–145)
Sodium: 128 mmol/L — ABNORMAL LOW (ref 135–145)
Sodium: 130 mmol/L — ABNORMAL LOW (ref 135–145)

## 2021-02-25 LAB — HEPATIC FUNCTION PANEL
ALT: 38 U/L (ref 0–44)
AST: 75 U/L — ABNORMAL HIGH (ref 15–41)
Albumin: 2.1 g/dL — ABNORMAL LOW (ref 3.5–5.0)
Alkaline Phosphatase: 218 U/L — ABNORMAL HIGH (ref 38–126)
Bilirubin, Direct: 2 mg/dL — ABNORMAL HIGH (ref 0.0–0.2)
Indirect Bilirubin: 2.5 mg/dL — ABNORMAL HIGH (ref 0.3–0.9)
Total Bilirubin: 4.5 mg/dL — ABNORMAL HIGH (ref 0.3–1.2)
Total Protein: 5 g/dL — ABNORMAL LOW (ref 6.5–8.1)

## 2021-02-25 LAB — MAGNESIUM
Magnesium: 1.5 mg/dL — ABNORMAL LOW (ref 1.7–2.4)
Magnesium: 2.2 mg/dL (ref 1.7–2.4)
Magnesium: 1.9 mg/dL (ref 1.7–2.4)

## 2021-02-25 LAB — CBG MONITORING, ED
Glucose-Capillary: 72 mg/dL (ref 70–99)
Glucose-Capillary: 126 mg/dL — ABNORMAL HIGH (ref 70–99)

## 2021-02-25 MED ORDER — VITAMIN A 3 MG (10000 UNIT) PO CAPS
10000.0000 [IU] | ORAL_CAPSULE | Freq: Every day | ORAL | Status: DC
  Administered 2021-02-25 – 2021-02-27 (×3): 10000 [IU] via ORAL
  Filled 2021-02-25 (×3): qty 1

## 2021-02-25 MED ORDER — TRAMADOL HCL 50 MG PO TABS
50.0000 mg | ORAL_TABLET | Freq: Two times a day (BID) | ORAL | Status: DC | PRN
  Administered 2021-02-25 – 2021-02-26 (×3): 50 mg via ORAL
  Filled 2021-02-25 (×3): qty 1

## 2021-02-25 MED ORDER — HYDROXYZINE HCL 25 MG PO TABS
50.0000 mg | ORAL_TABLET | Freq: Every day | ORAL | Status: DC | PRN
  Administered 2021-02-25 – 2021-02-26 (×2): 50 mg via ORAL
  Filled 2021-02-25 (×2): qty 2

## 2021-02-25 MED ORDER — SPIRONOLACTONE 25 MG PO TABS
100.0000 mg | ORAL_TABLET | Freq: Every day | ORAL | Status: DC
  Administered 2021-02-25 – 2021-02-26 (×2): 100 mg via ORAL
  Filled 2021-02-25: qty 4
  Filled 2021-02-25: qty 1

## 2021-02-25 MED ORDER — ALBUMIN HUMAN 25 % IV SOLN
12.5000 g | Freq: Once | INTRAVENOUS | Status: AC
  Administered 2021-02-25: 12.5 g via INTRAVENOUS
  Filled 2021-02-25: qty 50

## 2021-02-25 MED ORDER — POLYETHYLENE GLYCOL 3350 17 G PO PACK
17.0000 g | PACK | Freq: Every day | ORAL | Status: DC | PRN
Start: 2021-02-25 — End: 2021-02-27

## 2021-02-25 MED ORDER — MAGNESIUM SULFATE 2 GM/50ML IV SOLN
2.0000 g | Freq: Once | INTRAVENOUS | Status: AC
  Administered 2021-02-25: 2 g via INTRAVENOUS
  Filled 2021-02-25: qty 50

## 2021-02-25 MED ORDER — SPIRONOLACTONE 25 MG PO TABS
50.0000 mg | ORAL_TABLET | Freq: Every day | ORAL | Status: DC
  Filled 2021-02-25: qty 2

## 2021-02-25 MED ORDER — FUROSEMIDE 10 MG/ML IJ SOLN
20.0000 mg | Freq: Once | INTRAMUSCULAR | Status: AC
  Administered 2021-02-25: 20 mg via INTRAVENOUS
  Filled 2021-02-25: qty 2

## 2021-02-25 NOTE — Evaluation (Signed)
Physical Therapy Evaluation Patient Details Name: Jeanette Elliott MRN: [id number] DOB: 1981-03-02 Today's Date: 02/25/2021   History of Present Illness  Pt is a 40 y/o female admitted 6/14 secondary to SOB, hypokalemia, hypotension, and hyponatremia. PMH includes liver cirrhosis followed by MD at [place], and Sjogren's syndrome.  Clinical Impression  Pt admitted secondary to problem above with deficits below. Pt reporting mild SOB during mobility tasks, however, oxygen sats WFL throughout on RA. Required min A for bed mobility and min guard A for transfers and short distance gait. Pt with soft BPs, but did not have + orthostatics. Anticipate pt will progress well and will not require follow up PT. Discussed using rollator when outside of house. Will continue to follow acutely.      02/25/21 0942  Orthostatic Lying   BP- Lying (!) 87/64  Pulse- Lying 95  Orthostatic Sitting  BP- Sitting 94/71  Pulse- Sitting 115  Orthostatic Standing at 0 minutes  BP- Standing at 0 minutes 94/72  Pulse- Standing at 0 minutes 116    Follow Up Recommendations No PT follow up    Equipment Recommendations  None recommended by PT    Recommendations for Other Services       Precautions / Restrictions Precautions Precautions: Fall;Other (comment) Precaution Comments: watch BP Restrictions Weight Bearing Restrictions: No      Mobility  Bed Mobility Overal bed mobility: Needs Assistance Bed Mobility: Supine to Sit;Sit to Supine     Supine to sit: Min assist Sit to supine: Supervision   General bed mobility comments: Min A for assist with trunk elevation. Increased time required.    Transfers Overall transfer level: Needs assistance Equipment used: None Transfers: Sit to/from Stand Sit to Stand: Min guard         General transfer comment: Min guard for safety. Mild instability noted. Reports mild increase in SOB.  Ambulation/Gait Ambulation/Gait assistance: Min guard Gait Distance  (Feet): 5 Feet Assistive device: None Gait Pattern/deviations: Step-through pattern;Decreased stride length Gait velocity: Decreased   General Gait Details: Ambulated short distance in room. Mild SOB noted, but oxygen sats WFL throughout on RA. No overt LOB noted. After ambulation, BP at 88/67. Discussed using rollator when outside of house.  Stairs            Wheelchair Mobility    Modified Rankin (Stroke Patients Only)       Balance Overall balance assessment: Mild deficits observed, not formally tested                                           Pertinent Vitals/Pain Pain Assessment: Faces Faces Pain Scale: Hurts little more Pain Location: stomach and headache Pain Descriptors / Indicators: Grimacing;Guarding Pain Intervention(s): Limited activity within patient's tolerance;Monitored during session;Repositioned    Home Living Family/patient expects to be discharged to:: Private residence Living Arrangements: Alone Available Help at Discharge: Family Type of Home: House Home Access: Stairs to enter Entrance Stairs-Rails: None Entrance Stairs-Number of Steps: 2 Home Layout: One level Home Equipment: Civil engineer, contracting;Other (comment);Walker - 2 wheels;Walker - 4 wheels (mat in tub; lift chair)      Prior Function Level of Independence: Independent               Hand Dominance   Dominant Hand: Right    Extremity/Trunk Assessment   Upper Extremity Assessment Upper Extremity Assessment: Defer to OT evaluation  Lower Extremity Assessment Lower Extremity Assessment: Generalized weakness    Cervical / Trunk Assessment Cervical / Trunk Assessment: Normal  Communication   Communication: No difficulties  Cognition Arousal/Alertness: Awake/alert Behavior During Therapy: WFL for tasks assessed/performed Overall Cognitive Status: Within Functional Limits for tasks assessed                                        General  Comments General comments (skin integrity, edema, etc.): Pt's mom present during session    Exercises     Assessment/Plan    PT Assessment Patient needs continued PT services  PT Problem List Decreased strength;Decreased activity tolerance;Decreased balance;Decreased mobility       PT Treatment Interventions DME instruction;Functional mobility training;Stair training;Gait training;Balance training;Therapeutic exercise;Therapeutic activities;Patient/family education    PT Goals (Current goals can be found in the Care Plan section)  Acute Rehab PT Goals Patient Stated Goal: to go home PT Goal Formulation: With patient Time For Goal Achievement: 03/11/21 Potential to Achieve Goals: Fair    Frequency Min 3X/week   Barriers to discharge        Co-evaluation               AM-PAC PT "6 Clicks" Mobility  Outcome Measure Help needed turning from your back to your side while in a flat bed without using bedrails?: None Help needed moving from lying on your back to sitting on the side of a flat bed without using bedrails?: A Little Help needed moving to and from a bed to a chair (including a wheelchair)?: A Little Help needed standing up from a chair using your arms (e.g., wheelchair or bedside chair)?: A Little Help needed to walk in hospital room?: A Little Help needed climbing 3-5 steps with a railing? : A Little 6 Click Score: 19    End of Session   Activity Tolerance: Patient limited by fatigue Patient left: in bed;with call bell/phone within reach;with family/visitor present (on stretcher in ED) Nurse Communication: Mobility status PT Visit Diagnosis: Muscle weakness (generalized) (M62.81);Unsteadiness on feet (R26.81)    Time: 7741-2878 PT Time Calculation (min) (ACUTE ONLY): 12 min   Charges:   PT Evaluation $PT Eval Moderate Complexity: 1 Mod          Reuel Derby, PT, DPT  Acute Rehabilitation Services  Pager: [phone] Office: [phone]   Rudean Hitt 02/25/2021, 10:15 AM

## 2021-02-25 NOTE — Progress Notes (Signed)
Paged by RN regarding pt's headache. Per Dr. Corena Pilgrim (GI) note on 5/24, will avoid Tylenol. Given cirrhosis will also avoid NSAIDs unless GI approves use. Treat with low dose Tramadol 32m BID PRN.   VLyndee Hensen DO PGY-2, [facility] 02/25/2021

## 2021-02-25 NOTE — ED Notes (Signed)
MS Breakfast Ordered 

## 2021-02-25 NOTE — Discharge Summary (Signed)
[facility] Discharge Summary  Patient name: Jeanette Elliott Medical record number: [id number] Date of birth: [birth year] Age: 40 y.o. Gender: female Date of Admission: 02/24/2021  Date of Discharge: 02/27/2021 Admitting Physician: Lind Covert, MD  Primary Care Provider: Ronnald Nian, DO Consultants: None  Indication for Hospitalization: Hypokalemia with prolonged QTC  Discharge Diagnoses/Problem List:  Hypokalemia with prolonged QTC Bilateral lower extremity edema, liver cirrhosis Hypotension Hyponatremia Constipation in the setting of poor oral intake Decreased visual acuity Unintentional weight loss  Disposition: Home  Discharge Condition: Stable  Discharge Exam:  General: Thin, ill appearing female lying comfortably in bed, NAD CV: Regular rate and rhythm  Respiratory: Clear to ausculation bilaterally Abdomen: Mild diffuse tenderness, soft, non-distended, BS +, no fluid wave appreciated Extremities: 1+ edema appreciated in b/l lower extremities   Brief Hospital Course:  Jeanette Elliott is a 40 y.o. female presenting with feeling poorly, found to have significant hypokalemia. PMH is significant for Cirrhosis, vitamin B12 and iron deficiency anemia, GERD, PTSD, anxiety, HTN, Hep A, ascites, L 9th anterior rib fracture.   Hypokalemia with prolonged Qtc  Patient initially presented to be admitted 4 days ago with hypokalemia but left AMA. Now presenting again for dehydration and observation.  Patient has been nauseous with poor p.o. intake. EKG with prolonged Qtc ~673.  Lipase highly elevated at 62, total bili 5.4 (which is improved from 6/10 when it was 8.4), ammonia elevated at 54, sodium 128. Previously on potassium supplement, but was discontinued when spironolactone dose was increased on 6/8 to 100 mg.  Patient and mother reports that she was only taking 50 mg of the spironolactone, and given that  her potassium supplement was stopped, it  is likely that this medication confusion contributed to current hypokalemia.  Potassium improved to 3.5 and repeat EKG shows QTC 498.  Medications confirmed with Dr. Edison Pace at [facility] and she should be taking Lasix 20 and spironolactone 100 mg.    Liver cirrhosis  Bilateral lower extremity edema Patient is being followed by [facility] transplant team but per note on 6/8 she is not technically a transplant patient, possibly due to the alcohol component of her liver disease. Family states that they are on the transplant list, so we are unsure of her current official status. Patient stated that she was told by her [facility] providers if she did not comply with medical advice then she would no longer be a candidate for transplant.  Physical exam with jaundice and scleral icterus, abdominal distention without fluid wave and bilateral lower pitting edema.  Current MELD-Na score 22 (7-10% 90 day mortality).  Ammonia slightly elevated at 54, PT/INR 15.3/1.2.  Home medications include Lasix 20 mg, spironolactone 100 mg.  Lower extremity edema was much improved during hospitalization. [facility] suggested for albumin dose before IV lasix to help her diurese well. After re-evaluation patient d/c'd on lasix 20 and spironolactone 50 and Kcl 20 meq.    Hypotension BP on admission 94/67, patient states that she has some weakness and that she was started on midodrine.  On 5/31 there is documentation that patient was supposed to start midodrine 5 mg 3 times daily for lower blood pressures and Lasix dose was decreased to 20 mg daily.  On the med rec midodrine was not listed as a home medication, we discussed with her mother who reported she is taking Midodrine 10m TID, called pharmacy to confirm. Increased to 10 mg TID during hospitalization for better blood pressure  Hyponatremia-Improved Sodium 128 in the setting of liver cirrhosis.  During last hospitalization, patient was chronically in the low 130s.  Patient has poor p.o. intake  and malnutrition. Improved during hospitalization   Constipation in the setting of poor oral intake No bowel movement in 2 days, poor oral intake. No report of pain or straining with bowel movements, though tender in the LLQ.  Started on MiraLAX as needed     Visual acuity Patient complains of decreased visual acuity which is chronic for her and takes vitamins A an outpatient and is advised not to drive.  Restarted on vitamin a and vision improved during hospitalization.  Recent weight loss Most likely due to decreased oral intake and Lasix use.  Nutritional consult to help patient eat better and for educational purposes.   Issues for Follow Up:  Follow-up with Dr. Edison Pace in 1 week Follow-up with [facility] in 3 to 4 days for CMP, CBC and please show up early in AM. Updated list of medication from Dr. Edison Pace at next visit to avoid any confusion Lasix 20 and spironolactone 50 mg with Kcl 20 meq Significant Procedures: None  Significant Labs and Imaging:  Recent Labs  Lab 02/20/21 1331 02/24/21 0944  WBC 10.5 8.9  HGB 8.9* 8.4*  HCT 26.9* 24.7*  PLT 216 244   Recent Labs  Lab 02/20/21 1331 02/20/21 2028 02/24/21 0944 02/25/21 0043 02/25/21 0157 02/25/21 0600 02/25/21 1728 02/26/21 0234 02/26/21 1226 02/26/21 2137 02/27/21 0331  NA 129*  --  128*   < >  --  128* 130* 134* 130* 133* 133*  K 3.4*  --  2.6*   < >  --  3.9 3.7 3.3* 3.2* 4.2 4.4  CL 81*  --  80*   < >  --  89* 89* 90* 88* 93* 92*  CO2 16*  --  33*   < >  --  29 31 33* 31 33* 31  GLUCOSE 94  --  91   < >  --  90 91 81 98 89 82  BUN 5*  --  <5*   < >  --  <5* <5* <5* <5* <5* <5*  CREATININE 1.02*  --  0.87   < >  --  0.71 0.75 0.73 0.73 0.72 0.73  CALCIUM 8.5*  --  9.1   < >  --  8.2* 8.4* 8.6* 8.8* 9.1 9.6  MG  --  1.3*  --   --  1.5* 2.2 1.9 2.0  --   --   --   PHOS  --  3.5  --   --   --   --   --  2.3* 2.7  --   --   ALKPHOS 371*  --  288*  --  218*  --   --   --   --   --   --   AST 342*  --  106*  --   75*  --   --   --   --   --   --   ALT 95*  --  52*  --  38  --   --   --   --   --   --   ALBUMIN 2.5*  --  2.5*  --  2.1*  --   --   --  2.4*  --   --    < > = values in this interval not displayed.   DG Chest 2 View  Result Date: 02/24/2021 CLINICAL DATA:  40 year old female with shortness of breath. History of ascites. EXAM: CHEST - 2 VIEW COMPARISON:  CT Abdomen and Pelvis 02/20/2021. Chest radiographs 02/20/2021 and earlier. FINDINGS: Lung volumes and mediastinal contours remain normal. Visualized tracheal air column is within normal limits. Mild EKG button artifact in the left lung, small right anterior upper chest wall piercing. No pneumothorax, pulmonary edema, pleural effusion or confluent pulmonary opacity. No acute osseous abnormality identified. Negative visible bowel gas pattern. IMPRESSION: No acute cardiopulmonary abnormality. Electronically Signed   By: Genevie Ann M.D.   On: 02/24/2021 10:21      Results/Tests Pending at Time of Discharge: None  Discharge Medications:  Allergies as of 02/27/2021       Reactions   Latex Itching, Rash        Medication List     TAKE these medications    Adult One Daily Gummies Chew Chew 2 tablets by mouth daily.   albuterol 108 (90 Base) MCG/ACT inhaler Commonly known as: VENTOLIN HFA Inhale 2 puffs into the lungs every 6 (six) hours as needed (asthma attacks).   CLEAR EYES OP Place 1 drop into both eyes 2 (two) times daily.   EPINEPHrine 0.3 mg/0.3 mL Soaj injection Commonly known as: EPI-PEN Inject 0.3 mg into the muscle once as needed for anaphylaxis.   escitalopram 10 MG tablet Commonly known as: LEXAPRO Take 1 tablet (10 mg total) by mouth daily.   folic acid 1 MG tablet Commonly known as: FOLVITE Take 1 tablet (1 mg total) by mouth daily.   furosemide 20 MG tablet Commonly known as: LASIX Take 20 mg by mouth daily.   hydrOXYzine 25 MG tablet Commonly known as: ATARAX/VISTARIL Take 2 tablets (50 mg total) by mouth  at bedtime. What changed: when to take this   MAGNESIUM PO Take 1 tablet by mouth daily.   midodrine 10 MG tablet Commonly known as: PROAMATINE Take 1 tablet (10 mg total) by mouth 3 (three) times daily with meals. What changed:  medication strength how much to take   potassium chloride SA 20 MEQ tablet Commonly known as: KLOR-CON Take 1 tablet (20 mEq total) by mouth daily. Start taking on: February 28, 2021 What changed: how much to take   spironolactone 25 MG tablet Commonly known as: ALDACTONE Take 2 tablets (50 mg total) by mouth daily. Start taking on: February 28, 2021 What changed:  medication strength how much to take Another medication with the same name was removed. Continue taking this medication, and follow the directions you see here.   VITAMIN A PO Take 1 tablet by mouth daily.   Vitamin D (Ergocalciferol) 1.25 MG (50000 UNIT) Caps capsule Commonly known as: DRISDOL Take 50,000 Units by mouth every 7 (seven) days.        Discharge Instructions: Please refer to Patient Instructions section of EMR for full details.  Patient was counseled important signs and symptoms that should prompt return to medical care, changes in medications, dietary instructions, activity restrictions, and follow up appointments.   Follow-Up Appointments:  Follow-up Information     [facility] Follow up in 3 day(s).   Specialty: Gastroenterology Why: labs as [place] office in the early morning Contact information: [place] [id number] [place], [place], DO. Schedule an appointment as soon as possible for a visit in 1 week(s).   Specialty: Family Medicine Contact information: [place]  [place] [address] [phone]         Teena Irani, MD. Schedule an appointment as soon as possible for a visit in 1 week(s).   Specialty: Internal Medicine Contact information: [place] [address] [phone]                 Honor Junes, MD 02/27/2021, 11:48 AM PGY-1, [facility]

## 2021-02-25 NOTE — Progress Notes (Signed)
NEW ADMISSION NOTE New Admission Note:   Arrival Method: Stetcher Mental Orientation: A&O x4 Telemetry: [id number] Assessment: Completed Skin: Intact IV: LAC Pain: Headache 6/10 Tubes: none present Safety Measures: Safety Fall Prevention Plan has been given, discussed and signed Admission: Completed [facility] Orientation: Patient has been orientated to the room, unit and staff.  Family: none present  Orders have been reviewed and implemented. Will continue to monitor the patient. Call light has been placed within reach and bed alarm has been activated.   Mikki Santee, RN

## 2021-02-25 NOTE — Plan of Care (Signed)
  Problem: Education: Goal: Knowledge of General Education information will improve Description Including pain rating scale, medication(s)/side effects and non-pharmacologic comfort measures Outcome: Progressing   Problem: Health Behavior/Discharge Planning: Goal: Ability to manage health-related needs will improve Outcome: Progressing   

## 2021-02-25 NOTE — ED Notes (Signed)
Pt given 2 apple juices.

## 2021-02-25 NOTE — Progress Notes (Addendum)
Family Medicine Teaching Service Daily Progress Note Intern Pager: [phone]  Patient name: Jeanette Elliott Medical record number: [id number] Date of birth: April 07, 1981 Age: 40 y.o. Gender: female  Primary Care Provider: Ronnald Nian, DO Consultants: None Code Status: Full  Pt Overview and Major Events to Date:  02/24/2021: Admitted to FPTS  Assessment and Plan: Jeanette Elliott is a 40 year old female presented with feeling poorly and found to have significant hypokalemia.  PMH is significant for liver cirrhosis, vitamin B12 and iron deficiency anemia, GERD, PTSD, anxiety, HTN, HFpEF, ascites, L 9th anterior rib fracture.  Hypokalemia I Prolonged QTc K+ 3.5, improved from 2.6 this am.  Most likely due to dehydration, due to poor oral intake also and confusion about medication dosing and potassium supplement. Qtc 498 on repeat EKG. --Repeat BMP at 1pm --Mag in noon --PT/OT eval and treat --Cardiac monitoring  B/L lower extremity edema I Liver cirrhosis Lower extremities edema has improved significantly from presentation.  Defines her goal of care for this presentation is correction of electrolyte imbalance and nausea and would like to follow-up outpatient with her hepatologist.  Physical exam with scleral icterus, jaundice, much improved abdominal distention without fluid wave and 1+ bilateral lower pitting edema.  Home meds: Lasix 20 mg, spironolactone 100 mg --c/w Lasix 20 mg --c/w Spironolactone 100 mg --I's and O's --Daily weights  Hypotension BP 93/65 this am.  --c/w Midodrine 5 mg TID --Close monitoring --Encourage oral intake --Orthostatics vitals --Pulse ox --PT/OT eval and treat   Hyponatremia Na+ 130 this am, improving --BMP in noon --Encouraged oral intake  Constipation No bowel movement in 3 days.  Notes to be related to her poor intake --Miralax PRN  Visual Acuity Patient notes to be decreased visual acuity, recently worsening.  Had in past and is advised  not to drive on her own and takes Vitamin A as an outpatient.  This morning denies any visual changes but will check with her periodically. --Monitor closely --Vitamin A 10,000 U daily  Recent weight loss Patient notes of recent weight loss (from 116 to 111), likely due to decreased oral intake in last couple weeks and Lasix use daily. --Nutritional consult --Encouraged oral intake    FEN/GI: Low sodium diet, 2 g PPx: Lovenox   Status is: Observation  The patient remains OBS appropriate and will d/c before 2 midnights.  Dispo: The patient is from: Home              Anticipated d/c is to: Home              Patient currently is not medically stable to d/c.   Difficult to place patient No        Subjective:  No acute overnight events. Patient complains of nausea and states her goal during this admission is for her electrolytes balance and would like to follow up with [facility].  Objective: Temp:  [98.3 F (36.8 C)] 98.3 F (36.8 C) (06/14 1243) Pulse Rate:  [86-107] 95 (06/15 1000) Resp:  [12-26] 19 (06/15 1000) BP: (87-103)/(62-76) 87/62 (06/15 1000) SpO2:  [94 %-100 %] 99 % (06/15 1000) Physical Exam: General: Ill appearing thin female lying comfortably in bed, NAD Cardiovascular: RRR, no murmur Respiratory: Clear to auscultation bilaterally Abdomen: Diffuse tenderness present, soft, nondistended, bowel sounds present, fluid wave not appreciated Extremities: 1+ pitting edema in bilateral lower extremities.  Laboratory: Recent Labs  Lab 02/20/21 1331 02/24/21 0944  WBC 10.5 8.9  HGB 8.9* 8.4*  HCT 26.9* 24.7*  PLT  216 244   Recent Labs  Lab 02/20/21 1331 02/24/21 0944 02/25/21 0043 02/25/21 0157  NA 129* 128* 130*  --   K 3.4* 2.6* 3.5  --   CL 81* 80* 85*  --   CO2 16* 33* 30  --   BUN 5* <5* <5*  --   CREATININE 1.02* 0.87 0.85  --   CALCIUM 8.5* 9.1 8.4*  --   PROT 6.4* 6.3*  --  5.0*  BILITOT 8.4* 5.5*  --  4.5*  ALKPHOS 371* 288*  --  218*  ALT  95* 52*  --  38  AST 342* 106*  --  75*  GLUCOSE 94 91 62*  --     Imaging/Diagnostic Tests: No results found.   Honor Junes, MD 02/25/2021, 10:49 AM PGY-1, [place] Intern pager: [phone], text pages welcome

## 2021-02-25 NOTE — Progress Notes (Signed)
Spoke with [facility] Physician Dr Bryan Lemma who sees patient outpatient.  Indicates patient has issue with being properly diuresed before being discharged.  Indicated to give Albumin and then 20 mg of IV Lasix tonight and they will plan on consulting her in the morning.   Delora Fuel, MD 02/25/2021, 3:42 PM PGY-1, [facility] Service pager [phone]

## 2021-02-25 NOTE — Progress Notes (Signed)
Occupational Therapy Evaluation  Pt living with her family. Presents with generalized weakness. Complains of changes in her vision over the last 1-2 months of vision being very "blurry". Initiated level 1 theraband exercises. BP low during session - Ted hose may help. Will follow up to establish HEP with theraband and educate on compensatory strategies for low vision and energy conservation. Given current life stressors and medical situation recommend pt follow up with outpt Counseling.    02/25/21 1000  OT Visit Information  Last OT Received On 02/25/21  Assistance Needed +1  History of Present Illness Pt is a 40 y/o female admitted 6/14 secondary to SOB, hypokalemia, hypotension, and hyponatremia. PMH includes liver cirrhosis followed by MD at [facility], and Sjogren's syndrome.  Precautions  Precautions Fall;Other (comment)  Precaution Comments watch BP  Home Living  Family/patient expects to be discharged to: Private residence  Living Arrangements Alone  Available Help at Discharge Family  Type of [place] to enter  Entrance Stairs-Number of Steps 2  Entrance Stairs-Rails None  Home Layout One level  Bathroom Shower/Tub Tub/shower unit  Kramer seat;Other (comment);Walker - 2 wheels;Walker - 4 wheels (mat in tub; lift chair)  Prior Function  Level of Independence Independent  Communication  Communication No difficulties  Pain Assessment  Pain Assessment Faces  Faces Pain Scale 4  Pain Location stomach and headache  Pain Descriptors / Indicators Grimacing;Guarding  Pain Intervention(s) Limited activity within patient's tolerance  Cognition  Arousal/Alertness Awake/alert  Behavior During Therapy WFL for tasks assessed/performed;Flat affect  Overall Cognitive Status Within Functional Limits for tasks assessed  Upper Extremity Assessment  Upper Extremity Assessment Generalized weakness  Lower Extremity  Assessment  Lower Extremity Assessment Defer to PT evaluation  Cervical / Trunk Assessment  Cervical / Trunk Assessment Normal  ADL  Overall ADL's  Modified independent  General ADL Comments able to complete basic ADL tasks although gets SOB easily. HAs had more difficulty with vision for the last 2 months therefore would benefit f  Vision- History  Baseline Vision/History Wears glasses  Wears Glasses Reading only  Patient Visual Report Blurring of vision  Vision- Assessment  Vision Assessment? Vision impaired- to be further tested in functional context  Additional Comments [place] vision has become more blurry over the last 2 months. Recommend follow up with eye doctor  Bed Mobility  Overal bed mobility Needs Assistance  Bed Mobility Supine to Sit;Sit to Supine  Supine to sit Min assist  Sit to supine Supervision  General bed mobility comments Min A for assist with trunk elevation. Increased time required.  Transfers  Overall transfer level Needs assistance  Equipment used None  Transfers Sit to/from Stand  Sit to Stand Min guard  General transfer comment Min guard for safety. Mild instability noted. Reports mild increase in SOB.  Balance  Overall balance assessment Mild deficits observed, not formally tested  General Comments  General comments (skin integrity, edema, etc.) Pt states she and her Mom are trying to find a counselor  Exercises  Exercises Other exercises  Other Exercises  Other Exercises initiated level 1 theraband exercises for geneal UB strengthening  OT - End of Session  Activity Tolerance Patient tolerated treatment well  Patient left in bed;with call bell/phone within reach  Nurse Communication Other (comment) (DC recommendations)  OT Assessment  OT Recommendation/Assessment Patient needs continued OT Services  OT Visit Diagnosis Muscle weakness (generalized) (M62.81);Pain;Low vision, both eyes (H54.2)  Pain -  part of body  (abdomen)  OT Problem List  Decreased strength;Decreased activity tolerance;Decreased knowledge of use of DME or AE;Pain  OT Plan  OT Frequency (ACUTE ONLY) Min 2X/week  OT Treatment/Interventions (ACUTE ONLY) Self-care/ADL training;Therapeutic exercise;Energy conservation;DME and/or AE instruction;Therapeutic activities;Patient/family education  AM-PAC OT "6 Clicks" Daily Activity Outcome Measure (Version 2)  Help from another person eating meals? 4  Help from another person taking care of personal grooming? 4  Help from another person toileting, which includes using toliet, bedpan, or urinal? 3  Help from another person bathing (including washing, rinsing, drying)? 3  Help from another person to put on and taking off regular upper body clothing? 3  Help from another person to put on and taking off regular lower body clothing? 3  6 Click Score 20  OT Recommendation  Follow Up Recommendations No OT follow up  OT Equipment None recommended by OT  Individuals Consulted  Consulted and Agree with Results and Recommendations Patient  Acute Rehab OT Goals  Patient Stated Goal to go home  OT Goal Formulation With patient  Time For Goal Achievement 03/11/21  Potential to Achieve Goals Good  OT Time Calculation  OT Start Time (ACUTE ONLY) 1000  OT Stop Time (ACUTE ONLY) 1018  OT Time Calculation (min) 18 min  OT General Charges  $OT Visit 1 Visit  OT Evaluation  $OT Eval Low Complexity 1 Low  Written Expression  Dominant Hand Right  Maurie Boettcher, OT/L   Acute OT Clinical Specialist Acute Rehabilitation Services Pager [phone] Office [phone]

## 2021-02-25 NOTE — Progress Notes (Addendum)
Spoke with [facility] RN for Dr. Edison Pace to alert staff of pt's admission. Pt's mom has already alerted Dr. Edison Pace of pt's admission. Clarified dosing of Lasix and Aldactone. Pt to take Lasix 20 mg and Spironolactone 100 mg daily. Potassium supplementation was discontinued. GI to schedule labs at their office upon discharge.  FPTS contact information provided. No additional recommendations at this time. Will increase Aldactone to 100 mg daily.   Lyndee Hensen, DO PGY-2, [facility] 02/25/2021

## 2021-02-25 NOTE — ED Notes (Signed)
Pt provided with two orange juices.

## 2021-02-25 NOTE — Progress Notes (Signed)
FPTS Interim Progress Note  Patient sleeping and resting comfortably.  Rounded with primary RN.  No concerns voiced.  No orders required.  Appreciated nightly round.  Today's Vitals   02/24/21 2255 02/24/21 2300 02/25/21 0153 02/25/21 0211  BP:  91/71  90/73  Pulse:  97  93  Resp:  14  13  Temp:      TempSrc:      SpO2:  97%  96%  PainSc: 0-No pain  0-No pain     Hypoglycemia- Serum glucose 62 Requested RN to give po juice Repeat CBG 72  Hypomagnesemia- Mag 1.5 Mag 2 gm IV x 1 Continue to monitor electrolytes and replete as needed  Carollee Leitz, MD 02/25/2021, 6:21 AM PGY-2, [facility] Service pager [phone]

## 2021-02-25 NOTE — Progress Notes (Signed)
Received a message from RN that patient wanting her Vistaril to be as needed not at bedtime. --Changed Vistaril 50 mg from nightly to Qday PRN  Honor Junes, MD PGY-1, Resident

## 2021-02-26 DIAGNOSIS — Z91040 Latex allergy status: Secondary | ICD-10-CM | POA: Diagnosis not present

## 2021-02-26 DIAGNOSIS — F419 Anxiety disorder, unspecified: Secondary | ICD-10-CM | POA: Diagnosis not present

## 2021-02-26 DIAGNOSIS — E46 Unspecified protein-calorie malnutrition: Secondary | ICD-10-CM | POA: Diagnosis present

## 2021-02-26 DIAGNOSIS — K769 Liver disease, unspecified: Secondary | ICD-10-CM | POA: Diagnosis not present

## 2021-02-26 DIAGNOSIS — K7031 Alcoholic cirrhosis of liver with ascites: Secondary | ICD-10-CM | POA: Diagnosis present

## 2021-02-26 DIAGNOSIS — Z6821 Body mass index (BMI) 21.0-21.9, adult: Secondary | ICD-10-CM | POA: Diagnosis not present

## 2021-02-26 DIAGNOSIS — E871 Hypo-osmolality and hyponatremia: Secondary | ICD-10-CM | POA: Diagnosis present

## 2021-02-26 DIAGNOSIS — E876 Hypokalemia: Secondary | ICD-10-CM | POA: Diagnosis present

## 2021-02-26 DIAGNOSIS — D509 Iron deficiency anemia, unspecified: Secondary | ICD-10-CM | POA: Diagnosis present

## 2021-02-26 DIAGNOSIS — I5032 Chronic diastolic (congestive) heart failure: Secondary | ICD-10-CM | POA: Diagnosis present

## 2021-02-26 DIAGNOSIS — Z7682 Awaiting organ transplant status: Secondary | ICD-10-CM | POA: Diagnosis not present

## 2021-02-26 DIAGNOSIS — L299 Pruritus, unspecified: Secondary | ICD-10-CM | POA: Diagnosis present

## 2021-02-26 DIAGNOSIS — M3500 Sicca syndrome, unspecified: Secondary | ICD-10-CM | POA: Diagnosis present

## 2021-02-26 DIAGNOSIS — I110 Hypertensive heart disease with heart failure: Secondary | ICD-10-CM | POA: Diagnosis present

## 2021-02-26 DIAGNOSIS — K760 Fatty (change of) liver, not elsewhere classified: Secondary | ICD-10-CM | POA: Diagnosis present

## 2021-02-26 DIAGNOSIS — R601 Generalized edema: Secondary | ICD-10-CM | POA: Diagnosis not present

## 2021-02-26 DIAGNOSIS — R531 Weakness: Secondary | ICD-10-CM | POA: Diagnosis not present

## 2021-02-26 DIAGNOSIS — F4310 Post-traumatic stress disorder, unspecified: Secondary | ICD-10-CM | POA: Diagnosis present

## 2021-02-26 DIAGNOSIS — Z20822 Contact with and (suspected) exposure to covid-19: Secondary | ICD-10-CM | POA: Diagnosis present

## 2021-02-26 DIAGNOSIS — I959 Hypotension, unspecified: Secondary | ICD-10-CM | POA: Diagnosis present

## 2021-02-26 DIAGNOSIS — D539 Nutritional anemia, unspecified: Secondary | ICD-10-CM | POA: Diagnosis present

## 2021-02-26 DIAGNOSIS — Z79899 Other long term (current) drug therapy: Secondary | ICD-10-CM | POA: Diagnosis not present

## 2021-02-26 DIAGNOSIS — E860 Dehydration: Secondary | ICD-10-CM | POA: Diagnosis present

## 2021-02-26 DIAGNOSIS — K219 Gastro-esophageal reflux disease without esophagitis: Secondary | ICD-10-CM | POA: Diagnosis present

## 2021-02-26 DIAGNOSIS — K5900 Constipation, unspecified: Secondary | ICD-10-CM | POA: Diagnosis present

## 2021-02-26 DIAGNOSIS — R9431 Abnormal electrocardiogram [ECG] [EKG]: Secondary | ICD-10-CM | POA: Diagnosis present

## 2021-02-26 DIAGNOSIS — B159 Hepatitis A without hepatic coma: Secondary | ICD-10-CM | POA: Diagnosis present

## 2021-02-26 LAB — BASIC METABOLIC PANEL
Anion gap: 11 (ref 5–15)
Anion gap: 7 (ref 5–15)
BUN: <5 mg/dL — ABNORMAL LOW (ref 6–20)
BUN: <5 mg/dL — ABNORMAL LOW (ref 6–20)
CO2: 33 mmol/L — ABNORMAL HIGH (ref 22–32)
CO2: 33 mmol/L — ABNORMAL HIGH (ref 22–32)
Calcium: 8.6 mg/dL — ABNORMAL LOW (ref 8.9–10.3)
Calcium: 9.1 mg/dL (ref 8.9–10.3)
Chloride: 90 mmol/L — ABNORMAL LOW (ref 98–111)
Chloride: 93 mmol/L — ABNORMAL LOW (ref 98–111)
Creatinine, Ser: 0.73 mg/dL (ref 0.44–1.00)
Creatinine, Ser: 0.72 mg/dL (ref 0.44–1.00)
GFR, Estimated: >60 mL/min (ref >60)
GFR, Estimated: >60 mL/min (ref >60)
Glucose, Bld: 81 mg/dL (ref 70–99)
Glucose, Bld: 89 mg/dL (ref 70–99)
Potassium: 3.3 mmol/L — ABNORMAL LOW (ref 3.5–5.1)
Potassium: 4.2 mmol/L (ref 3.5–5.1)
Sodium: 134 mmol/L — ABNORMAL LOW (ref 135–145)
Sodium: 133 mmol/L — ABNORMAL LOW (ref 135–145)

## 2021-02-26 LAB — RENAL FUNCTION PANEL
Albumin: 2.4 g/dL — ABNORMAL LOW (ref 3.5–5.0)
Anion gap: 11 (ref 5–15)
BUN: <5 mg/dL — ABNORMAL LOW (ref 6–20)
CO2: 31 mmol/L (ref 22–32)
Calcium: 8.8 mg/dL — ABNORMAL LOW (ref 8.9–10.3)
Chloride: 88 mmol/L — ABNORMAL LOW (ref 98–111)
Creatinine, Ser: 0.73 mg/dL (ref 0.44–1.00)
GFR, Estimated: >60 mL/min (ref >60)
Glucose, Bld: 98 mg/dL (ref 70–99)
Phosphorus: 2.7 mg/dL (ref 2.5–4.6)
Potassium: 3.2 mmol/L — ABNORMAL LOW (ref 3.5–5.1)
Sodium: 130 mmol/L — ABNORMAL LOW (ref 135–145)

## 2021-02-26 LAB — PHOSPHORUS: Phosphorus: 2.3 mg/dL — ABNORMAL LOW (ref 2.5–4.6)

## 2021-02-26 LAB — MAGNESIUM: Magnesium: 2 mg/dL (ref 1.7–2.4)

## 2021-02-26 MED ORDER — POTASSIUM CHLORIDE CRYS ER 20 MEQ PO TBCR
20.0000 meq | EXTENDED_RELEASE_TABLET | Freq: Once | ORAL | Status: AC
  Administered 2021-02-26: 20 meq via ORAL
  Filled 2021-02-26: qty 1

## 2021-02-26 MED ORDER — MIDODRINE HCL 5 MG PO TABS
10.0000 mg | ORAL_TABLET | Freq: Three times a day (TID) | ORAL | Status: DC
  Administered 2021-02-26 – 2021-02-27 (×3): 10 mg via ORAL
  Filled 2021-02-26 (×3): qty 2

## 2021-02-26 MED ORDER — POTASSIUM CHLORIDE CRYS ER 20 MEQ PO TBCR: 20.0000 meq | EXTENDED_RELEASE_TABLET | Freq: Every day | ORAL | Status: DC

## 2021-02-26 MED ORDER — POTASSIUM CHLORIDE CRYS ER 20 MEQ PO TBCR
40.0000 meq | EXTENDED_RELEASE_TABLET | Freq: Once | ORAL | Status: AC
  Administered 2021-02-26: 40 meq via ORAL
  Filled 2021-02-26: qty 2

## 2021-02-26 MED ORDER — POTASSIUM PHOSPHATES 15 MMOLE/5ML IV SOLN
10.0000 mmol | Freq: Once | INTRAVENOUS | Status: AC
Start: 2021-02-26 — End: 2021-02-26
  Administered 2021-02-26: 10 mmol via INTRAVENOUS
  Filled 2021-02-26: qty 3.33

## 2021-02-26 MED ORDER — SPIRONOLACTONE 25 MG PO TABS
50.0000 mg | ORAL_TABLET | Freq: Every day | ORAL | Status: DC
  Administered 2021-02-27: 50 mg via ORAL
  Filled 2021-02-26: qty 2

## 2021-02-26 MED ORDER — CALCIUM CARBONATE ANTACID 500 MG PO CHEW
1.0000 | CHEWABLE_TABLET | Freq: Two times a day (BID) | ORAL | Status: DC | PRN
  Administered 2021-02-26 (×2): 200 mg via ORAL
  Filled 2021-02-26 (×2): qty 1

## 2021-02-26 NOTE — Progress Notes (Signed)
Family Medicine Teaching Service Daily Progress Note Intern Pager: [phone]  Patient name: Jeanette Elliott Medical record number: [id number] Date of birth: 1981/09/04 Age: 40 y.o. Gender: female  Primary Care Provider: Ronnald Nian, DO Consultants: GI Code Status: Full  Pt Overview and Major Events to Date:  02/24/2021: Admitted to FPTS  Assessment and Plan: Jeanette Elliott is a 40 year old female presenting with feeling poorly and found to have significant hypokalemia which is improving.  PMH is significant for liver cirrhosis, vitamin B12 and iron deficiency anemia, GERD, PTSD, anxiety, HTN, HFpEF, ascites, L ninth anterior rib fracture.  Hypokalemia I Hypophosphatemia I Hypomagnesemia K+3.3 this am, P+ 2.3, Mag 2.0 --Kcl 20 meq PO --Kphosphate 10 nmol x1  --Renal function panel pm --PT/OT eval and treat --c/w Cardiac monitoring  B/L lower extremity edema I Liver cirrhosis UO~1.4L yesterday after receiving 12.5 g Albumin and Lasix 20 mg IV. GI plans to see patient this afternoon --Waiting for GI recomndations --c/w Lasix 20 mg --c/w spironolactone 100 mg --Strict I's and O's --Daily weights  Hypotension SBP 86-94, DBP 60-72.  OT suggested outpatient OT follow-up --Increase midodrine to 10 mg 3 times daily --Monitor closely --Encouraged oral intake --Pulse ox --PT/OT Eval and treat  Hyponatremia Na+ 134, improving --Renal function panel in pm --Encouraged oral intake  Constipation She declining taking any MiraLAX or medication to help with bowel movement.  Encouraged to eat well and use MiraLAX if needed. --Miralax in PRN     FEN/GI: Low sodium diet, 2 g PPx: Lovenox   Status is: Inpatient  The patient will require care spanning > 2 midnights and should be moved to inpatient because: Hemodynamically unstable and Persistent severe electrolyte disturbances  Dispo: The patient is from: Home              Anticipated d/c is to: Home              Patient  currently is not medically stable to d/c.   Difficult to place patient No        Subjective:  No acute overnight events. Patient evaluated at bedside this morning and states she feels much better this a.m. and would like to go home if okay with medical team.  Informed about GI planning to see her today.  Objective: Temp:  [97.9 F (36.6 C)-98.5 F (36.9 C)] 97.9 F (36.6 C) (06/16 0918) Pulse Rate:  [84-98] 88 (06/16 0918) Resp:  [12-19] 17 (06/16 0918) BP: (84-92)/(60-70) 84/60 (06/16 0918) SpO2:  [94 %-100 %] 100 % (06/16 0918) Weight:  [117 lb 4.6 oz (53.2 kg)] 117 lb 4.6 oz (53.2 kg) (06/15 1700) Physical Exam: General: Thin, ill-appearing female lying comfortably in bed, NAD Cardiovascular: RRR Respiratory: Clear to auscultation bilaterally Abdomen: Mild tenderness present, soft, nondistended, bowel sounds present, no fluid wave appreciated Extremities: 1+ pitting edema bilateral lower extremities, improving  Laboratory: Recent Labs  Lab 02/20/21 1331 02/24/21 0944  WBC 10.5 8.9  HGB 8.9* 8.4*  HCT 26.9* 24.7*  PLT 216 244   Recent Labs  Lab 02/20/21 1331 02/24/21 0944 02/25/21 0043 02/25/21 0157 02/25/21 0600 02/25/21 1728 02/26/21 0234  NA 129* 128*   < >  --  128* 130* 134*  K 3.4* 2.6*   < >  --  3.9 3.7 3.3*  CL 81* 80*   < >  --  89* 89* 90*  CO2 16* 33*   < >  --  29 31 33*  BUN 5* <5*   < >  --  <  5* <5* <5*  CREATININE 1.02* 0.87   < >  --  0.71 0.75 0.73  CALCIUM 8.5* 9.1   < >  --  8.2* 8.4* 8.6*  PROT 6.4* 6.3*  --  5.0*  --   --   --   BILITOT 8.4* 5.5*  --  4.5*  --   --   --   ALKPHOS 371* 288*  --  218*  --   --   --   ALT 95* 52*  --  38  --   --   --   AST 342* 106*  --  75*  --   --   --   GLUCOSE 94 91   < >  --  90 91 81   < > = values in this interval not displayed.     Imaging/Diagnostic Tests: No results found.   Honor Junes, MD 02/26/2021, 11:24 AM PGY-1, [place] Intern pager: [phone], text  pages welcome

## 2021-02-26 NOTE — Plan of Care (Signed)
  Problem: Education: Goal: Knowledge of General Education information will improve Description Including pain rating scale, medication(s)/side effects and non-pharmacologic comfort measures Outcome: Progressing   Problem: Health Behavior/Discharge Planning: Goal: Ability to manage health-related needs will improve Outcome: Progressing   

## 2021-02-26 NOTE — Telephone Encounter (Signed)
Patient currently admitted to the hospital.  Per review of notes, will be discharging with KCl 20 M EQ/day along with Lasix/Aldactone.

## 2021-02-26 NOTE — Consult Note (Addendum)
[facility] Consult: 9:35 AM 02/26/2021  LOS: 0 days    Referring Provider: Dr McDiarmid  Primary Care Physician:  Ronnald Nian, DO Primary Gastroenterologist:  Dr. Gerrit Heck MD. Dawna Part, MD at [facility]    Reason for Consultation: Management of diuretics.   HPI: Jeanette Elliott is a 40 y.o. female.  PMH Sjogren's dz.  Chronic macrocytic anemia.  Protein calorie malnutrition.  Ascites.  Several months of abnormal LFTs, edema, weight loss.  Serologic, radiographic work-up unrevealing.  Although she has iron overload there is no evidence for hemochromatosis.  Extensive lab work-up rules out AIH, Wilson's disease, viral hepatitis, autoimmune diseases of PBC and PSC.  Liver biopsy read at [facility] with nonspecific findings.  Dr. Bryan Lemma referred her to [facility] hepatology, appt in mid July. Hospitalized 3 weeks ago with abdominal distention/discomfort, new onset ascites and pleural effusion and slightly worsened LFTs.  No SBP on 02/03/2021 2.4 L paracentesis.  Discharged on spironolactone 100 mg/daily, furosemide 40 mg/daily, potassium Admitted but left AMA 02/20/2021 with complaint of weakness, nausea/vomiting.  Labs revealed hypokalemia (3.4) hyponatremia (129), AKI.  MELD score was 23.  Felt better after IV fluids and Zofran.  Wanted thoracentesis thinking it would make her feel better.  However 02/20/2021 CTAP w contrast showed mild ascites overall improved from previous imaging.  Pleural effusions resolved.  Stable fatty liver.  Mild colon wall thickening consistent with underlying ascites.   Seen at [facility] on 5/31.  Meds were adjusted to Aldactone 50 mg, Lasix 20 mg, potassium discontinued, midodrine added.  Advised to continue 2 g sodium diet and increase protein intake.  As a result her lower extremity edema got  worse.  Stable, nonsevere left abdominal pain but using oxycodone as needed.  According to medical notes, she is not on a transplant list at present. Presented to ED yesterday, as recommended by Dr. Edison Pace, with malaise, nausea, poor p.o. intake.  Hypotensive at 94/67.  Hypokalemic at 2.6.  Prolonged QTC.   Current meld-Na 22. Received Albumin infusion 12.5 g of 25% solution x 1, PO and runs of IV potassium.  Current diuretics of Aldactone 100 mg/daily, Lasix 20 mg/daily.  Midodrine continues. 2 view CXR shows no acute abnormalities. Current/latest BPs in the 80s/60.  Heart rate in the 80s.  Excellent room air saturation. Clinically nausea and vomiting have resolved.  She is tolerating small to moderate amounts of solid food.  Last bowel movement was a few days ago which is normal for her she often goes a few days without bowel movements.  Stable left abdominal pain.  Weakness improved.  Overall feels ready to discharge home.  Sodium 128 >> 34.  Potassium 2.6 >> 3.9 >> 3.3.   BUN/creatinine not elevated. T bili 4.5.  Alk phos 218.  AST/ALT 75/38. Hgb 8.4.  MCV 101.  Normal WBCs and platelets.  INR 1.2.    Past Medical History:  Diagnosis Date   Asthma    Elevated liver enzymes    Jaundice    Sjogren's syndrome ([place])     Past Surgical History:  Procedure Laterality Date   IR PARACENTESIS  02/03/2021   IR THORACENTESIS ASP PLEURAL SPACE W/IMG GUIDE  01/16/2021   SUBMANDIBULAR GLAND EXCISION     TONSILLECTOMY      Prior to Admission medications   Medication Sig Start Date End Date Taking? Authorizing Provider  albuterol (VENTOLIN HFA) 108 (90 Base) MCG/ACT inhaler Inhale 2 puffs into the lungs every 6 (six) hours as needed (asthma attacks). 01/22/21  Yes Cirigliano, Mary K, DO  escitalopram (LEXAPRO) 10 MG tablet Take 1 tablet (10 mg total) by mouth daily. 01/22/21  Yes Cirigliano, Garvin Fila, DO  folic acid (FOLVITE) 1 MG tablet Take 1 tablet (1 mg total) by mouth daily. 02/06/21  Yes Cirigliano,  Mary K, DO  furosemide (LASIX) 20 MG tablet Take 20 mg by mouth daily.   Yes [provider]  hydrOXYzine (ATARAX/VISTARIL) 25 MG tablet Take 2 tablets (50 mg total) by mouth at bedtime. Patient taking differently: Take 50 mg by mouth 3 (three) times daily. 01/22/21  Yes Cirigliano, Mary K, DO  MAGNESIUM PO Take 1 tablet by mouth daily.   Yes [provider]  midodrine (PROAMATINE) 5 MG tablet Take 5 mg by mouth 3 (three) times daily with meals.   Yes [provider]  Multiple Vitamins-Minerals (ADULT ONE DAILY GUMMIES) CHEW Chew 2 tablets by mouth daily.   Yes [provider]  Naphazoline HCl (CLEAR EYES OP) Place 1 drop into both eyes 2 (two) times daily.   Yes [provider]  spironolactone (ALDACTONE) 50 MG tablet Take 50 mg by mouth daily.   Yes [provider]  VITAMIN A PO Take 1 tablet by mouth daily.   Yes [provider]  Vitamin D, Ergocalciferol, (DRISDOL) 1.25 MG (50000 UNIT) CAPS capsule Take 50,000 Units by mouth every 7 (seven) days.   Yes [provider]  EPINEPHrine 0.3 mg/0.3 mL IJ SOAJ injection Inject 0.3 mg into the muscle once as needed for anaphylaxis. 01/22/21   Cirigliano, Garvin Fila, DO  potassium chloride SA (KLOR-CON) 20 MEQ tablet Take 40 mEq by mouth daily. Patient not taking: Reported on 02/25/2021    [provider]    Scheduled Meds:  enoxaparin (LOVENOX) injection  40 mg Subcutaneous Q24H   escitalopram  10 mg Oral Daily   folic acid  1 mg Oral Daily   furosemide  20 mg Oral Daily   midodrine  5 mg Oral TID WC   naphazoline-glycerin  1 drop Both Eyes BID   spironolactone  100 mg Oral Daily   vitamin A  10,000 Units Oral Daily   Infusions:  potassium PHOSPHATE IVPB (in mmol)     PRN Meds: hydrOXYzine, polyethylene glycol, traMADol   Allergies as of 02/24/2021 - Review Complete 02/24/2021  Allergen Reaction Noted   Latex Itching and Rash 10/10/2019    Family History   Problem Relation Age of Onset   Colon cancer Neg Hx    Esophageal cancer Neg Hx     Social History   Socioeconomic History   Marital status: Divorced    Spouse name: Not on file   Number of children: Not on file   Years of education: Not on file   Highest education level: Not on file  Occupational History   Not on file  Tobacco Use   Smoking status: Never   Smokeless tobacco: Never  Vaping Use   Vaping Use: Never used  Substance and Sexual Activity   Alcohol use: Not Currently  Drug use: Not Currently   Sexual activity: Not on file  Other Topics Concern   Not on file  Social History Narrative   Not on file   Social Determinants of Health   Financial Resource Strain: Not on file  Food Insecurity: Not on file  Transportation Needs: Not on file  Physical Activity: Not on file  Stress: Not on file  Social Connections: Not on file  Intimate Partner Violence: Not on file    REVIEW OF SYSTEMS: Constitutional: Malaise improved. ENT:  No nose bleeds Pulm: No shortness of breath or cough CV:  No palpitations, no angina.  Resolved lower extremity edema. GU:  No hematuria, no frequency GI: See HPI. Heme: Usual or excessive bleeding or bruising. Transfusions: None. Neuro: Headaches.  No peripheral tingling or numbness Derm:  No itching, no rash or sores.  Endocrine:  No sweats or chills.  No polyuria or dysuria Immunization:  not queried.     PHYSICAL EXAM: Vital signs in last 24 hours: Vitals:   02/26/21 0605 02/26/21 0918  BP: (!) 86/60 (!) 84/60  Pulse: 85 88  Resp: 18 17  Temp: 98.1 F (36.7 C) 97.9 F (36.6 C)  SpO2: 97% 100%   Wt Readings from Last 3 Encounters:  02/25/21 53.2 kg  02/04/21 58.5 kg  01/22/21 61.3 kg    General: Pale, frail, somewhat chronically ill-appearing WF. Head: No facial asymmetry or swelling.  No signs of head trauma. Eyes: No scleral icterus.  No conjunctival pallor.  EOMI. Ears: Not hard of hearing. Nose: Congestion or  discharge. Mouth: Has a moist, pink, clear.  Tongue midline. Neck: JVD, no thyromegaly, no masses Lungs: Labored breathing or cough.  Lungs clear bilaterally Heart: RRR.  No MRG. Abdomen: Soft.  Mild left-sided tenderness.  No masses, HSM, bruits, hernias.  No distention.  No fluid wave..   Rectal: Deferred Musc/Skeltl: Overall thin arms and legs. Extremities: No CCE Neurologic: Tremors, no asterixis.  Moves all 4 limbs without gross deficits, formal strength testing not performed. Skin: No jaundice.  No sores Nodes: No cervical adenopathy Psych: Flat affect, pleasant, calm  Intake/Output from previous day: 06/15 0701 - 06/16 0700 In: 360 [P.O.:360] Out: 1410 [Urine:1410] Intake/Output this shift: No intake/output data recorded.  LAB RESULTS: Recent Labs    02/24/21 0944  WBC 8.9  HGB 8.4*  HCT 24.7*  PLT 244   BMET Lab Results  Component Value Date   NA 134 (L) 02/26/2021   NA 130 (L) 02/25/2021   NA 128 (L) 02/25/2021   K 3.3 (L) 02/26/2021   K 3.7 02/25/2021   K 3.9 02/25/2021   CL 90 (L) 02/26/2021   CL 89 (L) 02/25/2021   CL 89 (L) 02/25/2021   CO2 33 (H) 02/26/2021   CO2 31 02/25/2021   CO2 29 02/25/2021   GLUCOSE 81 02/26/2021   GLUCOSE 91 02/25/2021   GLUCOSE 90 02/25/2021   BUN <5 (L) 02/26/2021   BUN <5 (L) 02/25/2021   BUN <5 (L) 02/25/2021   CREATININE 0.73 02/26/2021   CREATININE 0.75 02/25/2021   CREATININE 0.71 02/25/2021   CALCIUM 8.6 (L) 02/26/2021   CALCIUM 8.4 (L) 02/25/2021   CALCIUM 8.2 (L) 02/25/2021   LFT Recent Labs    02/24/21 0944 02/25/21 0157  PROT 6.3* 5.0*  ALBUMIN 2.5* 2.1*  AST 106* 75*  ALT 52* 38  ALKPHOS 288* 218*  BILITOT 5.5* 4.5*  BILIDIR  --  2.0*  IBILI  --  2.5*  PT/INR Lab Results  Component Value Date   INR 1.2 02/24/2021   INR 1.2 02/20/2021   INR 1.3 (H) 02/02/2021   Hepatitis Panel No results for input(s): HEPBSAG, HCVAB, HEPAIGM, HEPBIGM in the last 72 hours. C-Diff No components found  for: CDIFF Lipase     Component Value Date/Time   LIPASE 62 (H) 02/24/2021 0944    Drugs of Abuse  No results found for: LABOPIA, COCAINSCRNUR, LABBENZ, AMPHETMU, THCU, LABBARB   RADIOLOGY STUDIES: DG Chest 2 View  Result Date: 02/24/2021 CLINICAL DATA:  40 year old female with shortness of breath. History of ascites. EXAM: CHEST - 2 VIEW COMPARISON:  CT Abdomen and Pelvis 02/20/2021. Chest radiographs 02/20/2021 and earlier. FINDINGS: Lung volumes and mediastinal contours remain normal. Visualized tracheal air column is within normal limits. Mild EKG button artifact in the left lung, small right anterior upper chest wall piercing. No pneumothorax, pulmonary edema, pleural effusion or confluent pulmonary opacity. No acute osseous abnormality identified. Negative visible bowel gas pattern. IMPRESSION: No acute cardiopulmonary abnormality. Electronically Signed   By: Genevie Ann M.D.   On: 02/24/2021 10:21      IMPRESSION:     Complicated pt w cryptogennic cirrhosis.    Recurrent electrolyte issues of hyponatremia, hypokalemia.  Potassium discontinued by Dr. Edison Pace 5/31.     Hypotension despite midodrine added 5/31.  Ongoing chronic issue.    Hx ascites, no SBP on tap 3 weeks ago. Pleural effusion of 01/2021 now resolved.   Down titration of Lasix and Aldactone to 20 and 50 mg daily by hepatologist Dr. Edison Pace starting 5/31  Chronic macrocytic anemia.  MCV improved overall.  Folate deficient on laboratories of 01/11/2021.  Continues on outpatient dose of folic acid    PLAN:         Patient's case will be reviewed by Dr. Rush Landmark and diuretic guidelines forthcoming.  Case d/w Dr Rush Landmark.  Agree w Lasix 20 mg po daily, Aldactone 50 mg daily (down dose from current 100 mg given today) Repeat BMET this afternoon to assure correction of hypokalemia before discharge.  Order outpt  BMET through [facility] next Wednesday, cc labs to Dr Gerrit Heck.  Keep appt for EGD (variceal screening)  on 6/28 w Dr Bryan Lemma.  Keep appt w hepatology at [facility] next month.     Azucena Freed  02/26/2021, 9:35 AM Phone [phone]

## 2021-02-27 ENCOUNTER — Other Ambulatory Visit (HOSPITAL_COMMUNITY): Payer: Self-pay

## 2021-02-27 ENCOUNTER — Other Ambulatory Visit (HOSPITAL_COMMUNITY): Payer: Self-pay | Admitting: Physician Assistant

## 2021-02-27 DIAGNOSIS — K7031 Alcoholic cirrhosis of liver with ascites: Secondary | ICD-10-CM

## 2021-02-27 DIAGNOSIS — E876 Hypokalemia: Secondary | ICD-10-CM

## 2021-02-27 DIAGNOSIS — R531 Weakness: Secondary | ICD-10-CM

## 2021-02-27 DIAGNOSIS — F419 Anxiety disorder, unspecified: Secondary | ICD-10-CM

## 2021-02-27 DIAGNOSIS — K5900 Constipation, unspecified: Secondary | ICD-10-CM

## 2021-02-27 DIAGNOSIS — K30 Functional dyspepsia: Secondary | ICD-10-CM

## 2021-02-27 DIAGNOSIS — R634 Abnormal weight loss: Secondary | ICD-10-CM

## 2021-02-27 DIAGNOSIS — R17 Unspecified jaundice: Secondary | ICD-10-CM

## 2021-02-27 DIAGNOSIS — D539 Nutritional anemia, unspecified: Secondary | ICD-10-CM

## 2021-02-27 DIAGNOSIS — E43 Unspecified severe protein-calorie malnutrition: Secondary | ICD-10-CM

## 2021-02-27 LAB — BASIC METABOLIC PANEL
Anion gap: 10 (ref 5–15)
BUN: <5 mg/dL — ABNORMAL LOW (ref 6–20)
CO2: 31 mmol/L (ref 22–32)
Calcium: 9.6 mg/dL (ref 8.9–10.3)
Chloride: 92 mmol/L — ABNORMAL LOW (ref 98–111)
Creatinine, Ser: 0.73 mg/dL (ref 0.44–1.00)
GFR, Estimated: >60 mL/min (ref >60)
Glucose, Bld: 82 mg/dL (ref 70–99)
Potassium: 4.4 mmol/L (ref 3.5–5.1)
Sodium: 133 mmol/L — ABNORMAL LOW (ref 135–145)

## 2021-02-27 MED ORDER — POTASSIUM CHLORIDE CRYS ER 20 MEQ PO TBCR: 20.0000 meq | EXTENDED_RELEASE_TABLET | Freq: Every day | ORAL | Status: DC

## 2021-02-27 MED ORDER — POTASSIUM CHLORIDE CRYS ER 20 MEQ PO TBCR
20.0000 meq | EXTENDED_RELEASE_TABLET | Freq: Every day | ORAL | 0 refills | Status: DC
  Filled 2021-02-27: qty 30, 30d supply, fill #0

## 2021-02-27 MED ORDER — SPIRONOLACTONE 25 MG PO TABS
50.0000 mg | ORAL_TABLET | Freq: Every day | ORAL | 0 refills | Status: DC
  Filled 2021-02-27: qty 60, 30d supply, fill #0

## 2021-02-27 MED ORDER — MIDODRINE HCL 10 MG PO TABS
10.0000 mg | ORAL_TABLET | Freq: Three times a day (TID) | ORAL | 0 refills | Status: AC
  Filled 2021-02-27: qty 90, 30d supply, fill #0

## 2021-02-27 NOTE — Progress Notes (Signed)
PT Cancellation Note  Patient Details Name: Jeanette Elliott MRN: [id number] DOB: 10-Jun-1981   Cancelled Treatment:    Reason Eval/Treat Not Completed: Other (comment) (pt politely declined stating she is leaving today and would like to rest)  Lyanne Co, DPT Acute Rehabilitation Services [phone]   [place] 02/27/2021, 10:57 AM

## 2021-02-27 NOTE — Progress Notes (Signed)
Occupational Therapy Treatment Patient Details Name: Jeanette Elliott MRN: [id number] DOB: 03-Oct-1980 Today's Date: 02/27/2021    History of present illness Pt is a 40 y/o female admitted 6/14 secondary to SOB, hypokalemia, hypotension, and hyponatremia. PMH includes liver cirrhosis followed by MD at [facility], and Sjogren's syndrome.   OT comments  Pt received supine in bed agreeable to OT intervention. Session focus on implementing BUE HEP to facilitate increased strength and AROM with level 1 theraband. Pt reports her biggest concern is being able to sit<>stand without her dad having to pull her up, however pt declined OOB mobility. Pt completed therex as indicated below with no reports of increased pain. Pt continues to report low vision, provided pt with higher acuity readers with pt reporting improvement. DC plan remains appropriate, will follow acutely per POC.   Follow Up Recommendations  No OT follow up    Equipment Recommendations  None recommended by OT    Recommendations for Other Services      Precautions / Restrictions Precautions Precautions: Fall;Other (comment) Precaution Comments: watch BP Restrictions Weight Bearing Restrictions: No       Mobility Bed Mobility               General bed mobility comments: session conducted from bed level    Transfers                 General transfer comment: pt declined OOB mobiliy, pt reports she has been getting OOB to BR with assistance. pt does report that she feels like sit<>stands are difficult for her    Balance                                           ADL either performed or assessed with clinical judgement   ADL                                         General ADL Comments: pt declined OOB mobiity or ADL participation, session focus on BUE HEP and visual deficits     Vision Baseline Vision/History: Wears glasses Wears Glasses: Reading only Patient Visual Report:  Blurring of vision Additional Comments: pt reports she feels like she is both near sighted and far sighted, isssued pt 2+ reading glasses, pt reports improvements with improved power glasses   Perception     Praxis      Cognition Arousal/Alertness: Awake/alert Behavior During Therapy: WFL for tasks assessed/performed;Flat affect Overall Cognitive Status: Within Functional Limits for tasks assessed                                 General Comments: overall WFL, flat but participatory in session        Exercises General Exercises - Upper Extremity Shoulder Flexion: Strengthening;Both;5 reps;Supine;Theraband Theraband Level (Shoulder Flexion): Level 1 (Yellow) Shoulder Extension: Strengthening;Both;Supine;5 reps;Theraband Theraband Level (Shoulder Extension): Level 1 (Yellow) Shoulder Horizontal ABduction: Strengthening;Both;5 reps;Supine;Theraband Theraband Level (Shoulder Horizontal Abduction): Level 1 (Yellow) Shoulder Horizontal ADduction: Strengthening;Both;5 reps;Supine;Theraband Theraband Level (Shoulder Horizontal Adduction): Level 1 (Yellow) Elbow Flexion: Strengthening;Both;5 reps;Theraband Theraband Level (Elbow Flexion): Level 1 (Yellow) Elbow Extension: Strengthening;Both;5 reps;Theraband Theraband Level (Elbow Extension): Level 1 (Yellow) General Exercises - Lower Extremity Long Arc Quad: Strengthening;Both;5 reps;Supine Hip ABduction/ADduction:  Strengthening;Both;5 reps;Supine Straight Leg Raises: Strengthening;Both;5 reps;Supine Other Exercises Other Exercises: overhead tricep extensions with level 1 theraband   Shoulder Instructions       General Comments issued pt BUE HEP    Pertinent Vitals/ Pain       Pain Assessment: No/denies pain  Home Living                                          Prior Functioning/Environment              Frequency  Min 2X/week        Progress Toward Goals  OT Goals(current goals can  now be found in the care plan section)  Progress towards OT goals: Progressing toward goals  Acute Rehab OT Goals Patient Stated Goal: to go home OT Goal Formulation: With patient Time For Goal Achievement: 03/11/21 Potential to Achieve Goals: Good ADL Goals Pt/caregiver will Perform Home Exercise Program: Both right and left upper extremity;With written HEP provided;With theraband;Increased strength;Independently Additional ADL Goal #1: Pt will identify 3 compensatory strateiges for adjusting to low vision Additional ADL Goal #2: Pt will identify 3 energy conservation strategies  Plan Discharge plan remains appropriate;Frequency remains appropriate    Co-evaluation                 AM-PAC OT "6 Clicks" Daily Activity     Outcome Measure   Help from another person eating meals?: None Help from another person taking care of personal grooming?: None Help from another person toileting, which includes using toliet, bedpan, or urinal?: A Little Help from another person bathing (including washing, rinsing, drying)?: A Little Help from another person to put on and taking off regular upper body clothing?: A Little Help from another person to put on and taking off regular lower body clothing?: A Little 6 Click Score: 20    End of Session    OT Visit Diagnosis: Muscle weakness (generalized) (M62.81);Low vision, both eyes (H54.2)   Activity Tolerance Patient tolerated treatment well   Patient Left in bed;with call bell/phone within reach;with bed alarm set   Nurse Communication Mobility status        Time: 3754-3606 OT Time Calculation (min): 18 min  Charges: OT General Charges $OT Visit: 1 Visit OT Treatments $Therapeutic Exercise: 8-22 mins  Harley Alto., COTA/L Acute Rehabilitation Services [place] 02/27/2021, 10:44 AM

## 2021-02-27 NOTE — Progress Notes (Signed)
DISCHARGE NOTE HOME Jeanette Elliott to be discharged Home per MD order. Discussed prescriptions and follow up appointments with the patient. Prescriptions given to patient; medication list explained in detail. Patient verbalized understanding.  Skin clean, dry and intact without evidence of skin break down, no evidence of skin tears noted. IV catheter discontinued intact. Site without signs and symptoms of complications. Dressing and pressure applied. Pt denies pain at the site currently. No complaints noted.  Patient free of lines, drains, and wounds.   An After Visit Summary (AVS) was printed and given to the patient. Patient escorted via wheelchair, and discharged home via private auto.  Arlyss Repress, RN

## 2021-02-27 NOTE — Progress Notes (Signed)
Initial Nutrition Assessment  DOCUMENTATION CODES:   Severe malnutrition in context of chronic illness  INTERVENTION:  -Recommend liberalizing diet to regular -Magic cup TID with meals, each supplement provides 290 kcal and 9 grams of protein -PROSource PLUS PO 105ms BID, each supplement provides 100 kcals and 15 grams of protein -Snacks TID -MVI with minerals daily -Provided education on improving nutrition status after discharge  NUTRITION DIAGNOSIS:   Severe Malnutrition related to chronic illness (cirrhosis) as evidenced by percent weight loss, energy intake < or equal to 75% for > or equal to 1 month, severe muscle depletion, severe fat depletion, edema.  GOAL:   Patient will meet greater than or equal to 90% of their needs  MONITOR:   PO intake, Supplement acceptance, Weight trends, I & O's, Labs  REASON FOR ASSESSMENT:   Consult Assessment of nutrition requirement/status  ASSESSMENT:   Pt with PMH significant for Sjogren's syndrome, liver cirrhosis, vitamin B12 and iron deficiency anemia, GERD, PTSD, anxiety, HTN, HFpEF, ascites, and L ninth anterior rib fracture admitted with hypokalemia.  Pt recently evaluated by Clinical Nutrition team and reported poor po intake at home. Pt reported ongoing poor po intake prior to this admission 2/2 N/V, but now states N/V have resolved and that she is able tolerate small to moderate amounts of solid food. Only 1 meal documented since admission with 75% completion noted. Note pt has continued to lose weight since previous admission in late May (see below). Per weight readings, pt has experienced a clinically significant 13% weight loss over 1 month.   Pt expressing strong desire to return home. Per MD, pt is medically stable pending correction of hypokalemia.   UOP: 4x unmeasured occurrences x24 hours No emesis documented x24 hours Note pt has not had a BM in a couple of days. MD aware. Pt refusing bowel regimen at this  time  Medications:  enoxaparin (LOVENOX) injection  40 mg Subcutaneous Q24H   escitalopram  10 mg Oral Daily   folic acid  1 mg Oral Daily   furosemide  20 mg Oral Daily   midodrine  10 mg Oral TID WC   naphazoline-glycerin  1 drop Both Eyes BID   [START ON 02/28/2021] potassium chloride  20 mEq Oral Daily   spironolactone  50 mg Oral Daily   vitamin A  10,000 Units Oral Daily   Labs: Recent Labs  Lab 02/20/21 2028 02/24/21 0944 02/25/21 0600 02/25/21 1728 02/26/21 0234 02/26/21 1226 02/26/21 2137 02/27/21 0331  NA  --    < > 128* 130* 134* 130* 133* 133*  K  --    < > 3.9 3.7 3.3* 3.2* 4.2 4.4  CL  --    < > 89* 89* 90* 88* 93* 92*  CO2  --    < > 29 31 33* 31 33* 31  BUN  --    < > <5* <5* <5* <5* <5* <5*  CREATININE  --    < > 0.71 0.75 0.73 0.73 0.72 0.73  CALCIUM  --    < > 8.2* 8.4* 8.6* 8.8* 9.1 9.6  MG 1.3*   < > 2.2 1.9 2.0  --   --   --   PHOS 3.5  --   --   --  2.3* 2.7  --   --   GLUCOSE  --    < > 90 91 81 98 89 82   < > = values in this interval not displayed.   NUTRITION -  FOCUSED PHYSICAL EXAM:  Flowsheet Row Most Recent Value  Orbital Region Severe depletion  Upper Arm Region Severe depletion  Thoracic and Lumbar Region Mild depletion  Buccal Region Mild depletion  Temple Region Severe depletion  Clavicle Bone Region Moderate depletion  Clavicle and Acromion Bone Region Moderate depletion  Scapular Bone Region Moderate depletion  Dorsal Hand Mild depletion  Anterior Thigh Region Moderate depletion  Posterior Calf Region Moderate depletion  Edema (RD Assessment) Mild  [BLE]  Hair Reviewed  Eyes Reviewed  Mouth Reviewed  Skin Reviewed  Nails Reviewed       Diet Order:   Diet Order             Diet 2 gram sodium Room service appropriate? Yes; Fluid consistency: Thin  Diet effective now                   EDUCATION NEEDS:   Education needs have been addressed  Skin:  Skin Assessment: Reviewed RN Assessment  Last BM:   6/15  Height:   Ht Readings from Last 1 Encounters:  02/25/21 5' 2"  (1.575 m)    Weight:   Wt Readings from Last 1 Encounters:  02/25/21 53.2 kg   BMI:  Body mass index is 21.45 kg/m.  Estimated Nutritional Needs:   Kcal:  1650-1850  Protein:  80-95g  Fluid:  >1.65L/d    Larkin Ina, MS, RD, LDN Pronouns: She/Her/Hers RD pager number and weekend/on-call pager number located in [place].

## 2021-02-27 NOTE — Progress Notes (Signed)
Gastroenterology Inpatient Follow-up Note   PATIENT IDENTIFICATION  Jeanette Elliott is a 40 y.o. female with a pmh significant for chronic liver disease currently admitted with abnormal electrolytes and being titrated on diuretics. Hospital Day: 4  SUBJECTIVE  Patient's labs reviewed this morning show no evidence of hypokalemia and nonsevere hyponatremia.  Patient feels okay compared to yesterday.  Jaundice remains present.  Dark urine remains present.  Mild pruritus present.  She would like to go home if possible.     OBJECTIVE  Scheduled Inpatient Medications:   enoxaparin (LOVENOX) injection  40 mg Subcutaneous Q24H   escitalopram  10 mg Oral Daily   folic acid  1 mg Oral Daily   furosemide  20 mg Oral Daily   midodrine  10 mg Oral TID WC   naphazoline-glycerin  1 drop Both Eyes BID   [START ON 02/28/2021] potassium chloride  20 mEq Oral Daily   spironolactone  50 mg Oral Daily   vitamin A  10,000 Units Oral Daily   Continuous Inpatient Infusions:  PRN Inpatient Medications: calcium carbonate, hydrOXYzine, polyethylene glycol, traMADol   Physical Examination  Temp:  [97.9 F (36.6 C)-98.2 F (36.8 C)] 98 F (36.7 C) (06/17 0526) Pulse Rate:  [79-88] 83 (06/17 0526) Resp:  [16-18] 18 (06/17 0526) BP: (84-91)/(60-69) 89/69 (06/17 0526) SpO2:  [96 %-100 %] 98 % (06/17 0526) Temp (24hrs), Avg:98.1 F (36.7 C), Min:97.9 F (36.6 C), Max:98.2 F (36.8 C)  Weight: 53.2 kg GEN: NAD, appears chronically ill but nontoxic  PSYCH: Cooperative, without pressured speech EYE: Sclerae icteric ENT: MMM NECK: Supple CV: Nontachycardic RESP: Decreased breath sounds at the bases bilaterally without wheezing present GI: NABS, soft, protuberant abdomen, minimal tenderness to palpation, no rebound MSK/EXT: Lower extremity edema present SKIN: Jaundiced, spider angiomata present NEURO:  Alert & Oriented x 3, no focal deficits, no evidence of asterixis   Review of Data   Laboratory  Studies   Recent Labs  Lab 02/20/21 2028 02/24/21 0944 02/26/21 0234 02/26/21 1226 02/26/21 2137 02/27/21 0331  NA  --    < > 134* 130*   < > 133*  K  --    < > 3.3* 3.2*   < > 4.4  CL  --    < > 90* 88*   < > 92*  CO2  --    < > 33* 31   < > 31  BUN  --    < > <5* <5*   < > <5*  CREATININE  --    < > 0.73 0.73   < > 0.73  GLUCOSE  --    < > 81 98   < > 82  CALCIUM  --    < > 8.6* 8.8*   < > 9.6  MG 1.3*   < > 2.0  --   --   --   PHOS 3.5  --  2.3* 2.7  --   --    < > = values in this interval not displayed.   Recent Labs  Lab 02/25/21 0157  AST 75*  ALT 38  ALKPHOS 218*    Recent Labs  Lab 02/20/21 1331 02/24/21 0944  WBC 10.5 8.9  HGB 8.9* 8.4*  HCT 26.9* 24.7*  PLT 216 244   Recent Labs  Lab 02/20/21 1726 02/24/21 0944  INR 1.2 1.2   MELD-Na score: 17 at 02/26/2021  9:37 PM MELD score: 14 at 02/26/2021  9:37 PM Calculated from: Serum Creatinine:  0.72 mg/dL (Using min of 1 mg/dL) at 02/26/2021  9:37 PM Serum Sodium: 133 mmol/L at 02/26/2021  9:37 PM Total Bilirubin: 4.5 mg/dL at 02/25/2021  1:57 AM INR(ratio): 1.2 at 02/24/2021  9:44 AM Age: 59 years  Imaging Studies  No results found.  GI Procedures and Studies  No new studies to review   ASSESSMENT  Jeanette Elliott is a 40 y.o. femalewith a pmh significant for chronic liver disease currently admitted with abnormal electrolytes and being titrated on diuretics.  The patient is hemodynamically stable.  Clinically no significant change from yesterday into today other than electrolytes look to be in a much better fashion.  I see no contraindication in regards to her diuretic regimen and potassium supplementation for [place] to continue her current regimen in an outpatient setting.  We will work on arranging a follow-up set of labs within a week to ensure that the electrolytes do not change significantly.  These will be reviewed by Dr. Bryan Lemma next week.  She needs to come into the [place] office on [address] on Wednesday or  Thursday of next week and have the labs drawn.  No other contraindication to discharge at this time if medicine feels appropriate.   PLAN/RECOMMENDATIONS  Lasix 20 mg daily Spironolactone 50 mg daily Potassium chloride 20 mEq daily CMP/INR next week - Adjustments in diuretics based on how patient is doing Patient to take standing weight every 2 days and monitor Follow-up with Dr. Bryan Lemma Follow-up with [facility] hepatology Dr. Edison Pace No contraindication based on evaluation today for potential discharge   The GI inpatient service will sign off at this time.  If the patient remains in-house for some reason and further titration of diuretics is necessary please let us know.  Please page/call with questions or concerns.   Jeanette Britain, MD [facility] Office # [phone]    LOS: 1 day  Jeanette Elliott  02/27/2021, 9:05 AM

## 2021-03-04 ENCOUNTER — Ambulatory Visit (INDEPENDENT_AMBULATORY_CARE_PROVIDER_SITE_OTHER): Payer: 59 | Admitting: Family Medicine

## 2021-03-04 ENCOUNTER — Other Ambulatory Visit: Payer: Self-pay

## 2021-03-04 ENCOUNTER — Encounter: Payer: Self-pay | Admitting: Family Medicine

## 2021-03-04 VITALS — BP 106/66 | HR 94 | Temp 98.2°F | Ht 62.0 in | Wt 114.0 lb

## 2021-03-04 DIAGNOSIS — F1010 Alcohol abuse, uncomplicated: Secondary | ICD-10-CM | POA: Diagnosis not present

## 2021-03-04 DIAGNOSIS — F1021 Alcohol dependence, in remission: Secondary | ICD-10-CM | POA: Insufficient documentation

## 2021-03-04 DIAGNOSIS — R519 Headache, unspecified: Secondary | ICD-10-CM

## 2021-03-04 DIAGNOSIS — H547 Unspecified visual loss: Secondary | ICD-10-CM

## 2021-03-04 DIAGNOSIS — F1020 Alcohol dependence, uncomplicated: Secondary | ICD-10-CM | POA: Insufficient documentation

## 2021-03-04 DIAGNOSIS — K7460 Unspecified cirrhosis of liver: Secondary | ICD-10-CM | POA: Diagnosis not present

## 2021-03-04 DIAGNOSIS — R188 Other ascites: Secondary | ICD-10-CM | POA: Diagnosis not present

## 2021-03-04 LAB — COMPREHENSIVE METABOLIC PANEL
ALT: 20 U/L (ref 0–35)
AST: 44 U/L — ABNORMAL HIGH (ref 0–37)
Albumin: 3.2 g/dL — ABNORMAL LOW (ref 3.5–5.2)
Alkaline Phosphatase: 259 U/L — ABNORMAL HIGH (ref 39–117)
BUN: 6 mg/dL (ref 6–23)
CO2: 24 mEq/L (ref 19–32)
Calcium: 9.1 mg/dL (ref 8.4–10.5)
Chloride: 98 mEq/L (ref 96–112)
Creatinine, Ser: 0.63 mg/dL (ref 0.40–1.20)
GFR: 112.35 mL/min (ref >60.00)
Glucose, Bld: 80 mg/dL (ref 70–99)
Potassium: 4.3 mEq/L (ref 3.5–5.1)
Sodium: 134 mEq/L — ABNORMAL LOW (ref 135–145)
Total Bilirubin: 4.4 mg/dL — ABNORMAL HIGH (ref 0.2–1.2)
Total Protein: 6.1 g/dL (ref 6.0–8.3)

## 2021-03-04 LAB — CBC
HCT: 29.4 % — ABNORMAL LOW (ref 36.0–46.0)
Hemoglobin: 9.7 g/dL — ABNORMAL LOW (ref 12.0–15.0)
MCHC: 32.9 g/dL (ref 30.0–36.0)
MCV: 100.6 fl — ABNORMAL HIGH (ref 78.0–100.0)
Platelets: 392 10*3/uL (ref 150.0–400.0)
RBC: 2.92 Mil/uL — ABNORMAL LOW (ref 3.87–5.11)
RDW: 16.8 % — ABNORMAL HIGH (ref 11.5–15.5)
WBC: 8.9 10*3/uL (ref 4.0–10.5)

## 2021-03-04 MED ORDER — ZOLMITRIPTAN 2.5 MG PO TABS: 2.5000 mg | ORAL_TABLET | Freq: Once | ORAL | 0 refills | Status: DC

## 2021-03-04 NOTE — Progress Notes (Signed)
[facility] [address] [place] [address] Dept: [phone] Dept Fax: [phone]  Office Visit  Subjective:    Patient ID: Jeanette Elliott, female    DOB: Jun 01, 1981, 40 y.o..   MRN: [id number]  Chief Complaint  Patient presents with   Hospitalization [facility] f/u from 02/24/21.  C/o feeling swelling on stomach again and having nausea.       History of Present Illness:  Patient is in today for hospital follow-up. Jeanette Elliott was admitted at [facility] 6/15-6/17/2022 due to decompensated alcoholic cirrhosis, complicated with hypokalemia, pedal edema, hypotension, hyponatremia, acute jaundice, macrocytic anemia, protein-calorie malnutrition with unintentional weight loss, and decreased visual acuity. By discharge her hypokalemia had resolved and hyponatremia had improved. She was given an albumin infusion and treated with Lasix. Her edema had improved. Jeanette Elliott was recommended to follow-up with Dr. Sampson Goon, but she was unable to get an appointment with him before July. She has a follow-up scheduled for next week with Dr. Edison Pace at [facility] (transplant team).  Jeanette Elliott continues to maintain her sobriety. She notes it has been ~ 4 months since her last alcohol consumption. She is here with her mother. They note they are trying to get her engaged with outpatient treatment, but are meeting barriers related to accessing such care. She is awaiting word back from a therapist.  Jeanette Elliott notes she is getting episodic headaches. She states she had evaluation some years ago related to headaches and was treated with a migraine medication. She is uncertain what the medication might have been. She is currently having headaches a few times a week.  Jeanette Elliott notes some mild nausea at times. She is noting some mild increase in abdominal distension. She notes her pedal edema is stable. She admits to some issues with memory.  Jeanette Elliott  continues to complain of a change in her visual acuity. She had seen an optometrist for this a few months ago, but was doubtful of her assessment, as he had not noted her scleral icterus at that point. She has called about seeing an ophthalmologist, but may not be able to get in until Sept.  Past Medical History: Patient Active Problem List   Diagnosis Date Noted   Alcohol abuse 03/04/2021   Indigestion    Hypophosphatasia    Constipation    Hypokalemia 02/24/2021   Generalized weakness 02/20/2021   Abdominal distention    Hyperbilirubinemia    Anxiety    Cirrhosis ([place]) 02/02/2021   Pleural effusion    Protein-calorie malnutrition, severe 01/16/2021   Ascites    S/P thoracentesis    Weight loss, unintentional    Hemochromatosis    Closed fracture of rib of left side with routine healing    Elevated bilirubin    Macrocytic anemia    Folate deficiency    Jaundice 01/11/2021   Elevated liver enzymes    Fall    Past Surgical History:  Procedure Laterality Date   IR PARACENTESIS  02/03/2021   IR THORACENTESIS ASP PLEURAL SPACE W/IMG GUIDE  01/16/2021   SUBMANDIBULAR GLAND EXCISION     TONSILLECTOMY     Family History  Problem Relation Age of Onset   Colon cancer Neg Hx    Esophageal cancer Neg Hx     Outpatient Medications Prior to Visit  Medication Sig Dispense Refill   albuterol (VENTOLIN HFA) 108 (90 Base) MCG/ACT inhaler Inhale 2 puffs into the lungs every 6 (six) hours  as needed (asthma attacks). 8 g 1   EPINEPHrine 0.3 mg/0.3 mL IJ SOAJ injection Inject 0.3 mg into the muscle once as needed for anaphylaxis. 1 each 0   escitalopram (LEXAPRO) 10 MG tablet Take 1 tablet (10 mg total) by mouth daily. (Patient taking differently: Take 20 mg by mouth daily.) 90 tablet 1   folic acid (FOLVITE) 1 MG tablet Take 1 tablet (1 mg total) by mouth daily. 90 tablet 1   furosemide (LASIX) 20 MG tablet Take 20 mg by mouth daily.     hydrOXYzine (ATARAX/VISTARIL) 25 MG tablet Take 2  tablets (50 mg total) by mouth at bedtime. 90 tablet 0   MAGNESIUM PO Take 1 tablet by mouth daily.     midodrine (PROAMATINE) 10 MG tablet Take 1 tablet (10 mg total) by mouth 3 (three) times daily with meals. 90 tablet 0   Multiple Vitamins-Minerals (ADULT ONE DAILY GUMMIES) CHEW Chew 2 tablets by mouth daily.     Naphazoline HCl (CLEAR EYES OP) Place 1 drop into both eyes 2 (two) times daily.     potassium chloride SA (KLOR-CON) 20 MEQ tablet Take 1 tablet (20 mEq total) by mouth daily. 30 tablet 0   spironolactone (ALDACTONE) 25 MG tablet Take 2 tablets (50 mg total) by mouth daily. 60 tablet 0   VITAMIN A PO Take 1 tablet by mouth daily.     Vitamin D, Ergocalciferol, (DRISDOL) 1.25 MG (50000 UNIT) CAPS capsule Take 50,000 Units by mouth every 7 (seven) days.     No facility-administered medications prior to visit.   Allergies  Allergen Reactions   Latex Itching and Rash    Objective:   Today's Vitals   03/04/21 1138  BP: 106/66  Pulse: 94  Temp: 98.2 F (36.8 C)  TempSrc: Temporal  SpO2: 99%  Weight: 114 lb (51.7 kg)  Height: 5' 2"  (1.575 m)   Body mass index is 20.85 kg/m.   General: Well developed, well nourished. No acute distress. HEENT: Moderate scleral icterus. Lungs: Clear to auscultation bilaterally. No wheezing, rales or rhonchi. CV: RRR without murmurs or rubs. Pulses 2+ bilaterally. Abdomen: Soft, non-tender. No rebound or guarding. Extremities: 2+ edema noted bilaterally. Psych: Alert and oriented. Normal mood and affect.  Health Maintenance Due  Topic Date Due   COVID-19 Vaccine (1) Never done   Pneumococcal Vaccine 92-22 Years old (1 - PCV) Never done   TETANUS/TDAP  Never done   PAP SMEAR-Modifier  Never done   Lab Results Lab Results  Component Value Date   WBC 8.9 02/24/2021   HGB 8.4 (L) 02/24/2021   HCT 24.7 (L) 02/24/2021   MCV 101.6 (H) 02/24/2021   PLT 244 02/24/2021   BMP Latest Ref Rng & Units 02/27/2021 02/26/2021 02/26/2021  Glucose  70 - 99 mg/dL 82 89 98  BUN 6 - 20 mg/dL <5(L) <5(L) <5(L)  Creatinine 0.44 - 1.00 mg/dL 0.73 0.72 0.73  BUN/Creat Ratio 6 - 22 (calc) - - -  Sodium 135 - 145 mmol/L 133(L) 133(L) 130(L)  Potassium 3.5 - 5.1 mmol/L 4.4 4.2 3.2(L)  Chloride 98 - 111 mmol/L 92(L) 93(L) 88(L)  CO2 22 - 32 mmol/L 31 33(H) 31  Calcium 8.9 - 10.3 mg/dL 9.6 9.1 8.8(L)      Assessment & Plan:   1. Cirrhosis of liver with ascites, unspecified hepatic cirrhosis type [facility]) Reviewed hospital discharge summary and labs. Ms. Rivere has had recent decompensated cirrhosis. She has a current MELD score of 18 and  is Child-Pugh Class C. I will reassess her electrolytes, renal and liver functions. She is following up with Dr. Edison Pace next Thursday. She will continue on Lasix to help manage edema. Her memory issues amy be due to hepatic encephalopathy. This will need to be watched as she attempts to increase her protein intake due to her malnourished state.  - CBC - Comprehensive metabolic panel  2. Nonintractable episodic headache, unspecified headache type By history, Ms. Iezzi may have migraine headaches, though this could be a component of her chronic illness. I will give her a trial of low-dose Zomig to see if this will break the headaches when they occur. She should follow-up with Dr. Glori Luis in the next month to evaluate if this helped her headaches.  - ZOLMitriptan (ZOMIG) 2.5 MG tablet; Take 1 tablet (2.5 mg total) by mouth once for 1 dose. May repeat in 2 hours if headache persists or recurs.  Dispense: 15 tablet; Refill: 0  3. Alcohol abuse Maintaining sobriety currently. Family working to establish her with outpatient counseling.  4. Poor vision Loss of visual acuity not typically associated with cirrhosis. Recommend ophthalmology assessment.  Haydee Salter, MD

## 2021-03-10 ENCOUNTER — Encounter: Payer: Self-pay | Admitting: Gastroenterology

## 2021-03-10 ENCOUNTER — Other Ambulatory Visit: Payer: Self-pay

## 2021-03-10 ENCOUNTER — Ambulatory Visit (AMBULATORY_SURGERY_CENTER): Payer: 59 | Admitting: Gastroenterology

## 2021-03-10 VITALS — BP 108/64 | HR 72 | Temp 98.1°F | Resp 15 | Ht 67.0 in | Wt 111.0 lb

## 2021-03-10 DIAGNOSIS — K269 Duodenal ulcer, unspecified as acute or chronic, without hemorrhage or perforation: Secondary | ICD-10-CM

## 2021-03-10 DIAGNOSIS — K2960 Other gastritis without bleeding: Secondary | ICD-10-CM

## 2021-03-10 DIAGNOSIS — K2970 Gastritis, unspecified, without bleeding: Secondary | ICD-10-CM | POA: Diagnosis not present

## 2021-03-10 DIAGNOSIS — K7031 Alcoholic cirrhosis of liver with ascites: Secondary | ICD-10-CM

## 2021-03-10 DIAGNOSIS — K3189 Other diseases of stomach and duodenum: Secondary | ICD-10-CM

## 2021-03-10 DIAGNOSIS — R7401 Elevation of levels of liver transaminase levels: Secondary | ICD-10-CM

## 2021-03-10 MED ORDER — PANTOPRAZOLE SODIUM 40 MG PO TBEC: 40.0000 mg | DELAYED_RELEASE_TABLET | Freq: Two times a day (BID) | ORAL | 3 refills | Status: DC

## 2021-03-10 MED ORDER — SODIUM CHLORIDE 0.9 % IV SOLN: 500.0000 mL | Freq: Once | INTRAVENOUS | Status: DC

## 2021-03-10 NOTE — Op Note (Signed)
[place] Patient Name: Jeanette Elliott Procedure Date: 03/10/2021 9:51 AM MRN: [id number] Endoscopist: Gerrit Heck , MD Age: 40 Referring MD:  Date of Birth: 06-28-1981 Gender: Female Account #: [id number] Procedure:                Upper GI endoscopy Indications:              Cirrhosis rule out esophageal varices Medicines:                Monitored Anesthesia Care Procedure:                Pre-Anesthesia Assessment:                           - Prior to the procedure, a History and Physical                            was performed, and patient medications and                            allergies were reviewed. The patient's tolerance of                            previous anesthesia was also reviewed. The risks                            and benefits of the procedure and the sedation                            options and risks were discussed with the patient.                            All questions were answered, and informed consent                            was obtained. Prior Anticoagulants: The patient has                            taken no previous anticoagulant or antiplatelet                            agents. ASA Grade Assessment: III - A patient with                            severe systemic disease. After reviewing the risks                            and benefits, the patient was deemed in                            satisfactory condition to undergo the procedure.                           After obtaining informed consent, the endoscope was  passed under direct vision. Throughout the                            procedure, the patient's blood pressure, pulse, and                            oxygen saturations were monitored continuously. The                            GIF [id number] #[id number] was introduced through the                            mouth, and advanced to the second part of duodenum.                            The upper GI  endoscopy was accomplished without                            difficulty. The patient tolerated the procedure                            well. Scope In: Scope Out: Findings:                 The examined esophagus was normal.                           The Z-line was regular and was found 38 cm from the                            incisors.                           Localized mild inflammation characterized by                            erythema was found at the incisura and in the                            gastric antrum. Biopsies were taken with a cold                            forceps for histology. Estimated blood loss was                            minimal.                           The gastric fundus and gastric body were normal.                           One non-bleeding, clean based duodenal ulcer with                            no stigmata of bleeding was found in the duodenal  bulb. The lesion was 4 mm in largest dimension.                            Biopsies were taken with a cold forceps for                            histology. Estimated blood loss was minimal.                           The second portion of the duodenum was normal. Complications:            No immediate complications. Estimated Blood Loss:     Estimated blood loss was minimal. Impression:               - Normal esophagus.                           - Z-line regular, 38 cm from the incisors.                           - Gastritis. Biopsied.                           - Normal gastric fundus and gastric body.                           - Non-bleeding duodenal ulcer with no stigmata of                            bleeding. Biopsied.                           - Normal second portion of the duodenum. Recommendation:           - Patient has a contact number available for                            emergencies. The signs and symptoms of potential                            delayed  complications were discussed with the                            patient. Return to normal activities tomorrow.                            Written discharge instructions were provided to the                            patient.                           - Resume previous diet.                           - Continue present medications.                           -  Await pathology results.                           - Use Protonix (pantoprazole) 40 mg PO BID for 6                            weeks to promote mucosal healing of gastritis and                            duodenal ulcer, then reduce to 40 mg daily and                            slowly titrate off.                           - Repeat upper endoscopy in 2 years for screening                            purposes.                           - Follow-up in the [facility] this week                            as scheduled. Gerrit Heck, MD 03/10/2021 10:24:06 AM

## 2021-03-10 NOTE — Progress Notes (Signed)
pt tolerated well. VSS. awake and to recovery. Report given to RN.  Bite block left insitu to recovery. No trauma.

## 2021-03-10 NOTE — Progress Notes (Signed)
Called to room to assist during endoscopic procedure.  Patient ID and intended procedure confirmed with present staff. Received instructions for my participation in the procedure from the performing physician.  

## 2021-03-10 NOTE — Patient Instructions (Signed)
Please read handouts provided. Continue present medications. Await pathology results. Protonix ( pantoprazole ) 40 mg twice daily, then reduce to 40 mg daily and slowly titrate off. Repeat upper endoscopy in 2 years for screening. Follow-up in the [facility] this week as scheduled.    YOU HAD AN ENDOSCOPIC PROCEDURE TODAY AT [facility]:   Refer to the procedure report that was given to you for any specific questions about what was found during the examination.  If the procedure report does not answer your questions, please call your gastroenterologist to clarify.  If you requested that your care partner not be given the details of your procedure findings, then the procedure report has been included in a sealed envelope for you to review at your convenience later.  YOU SHOULD EXPECT: Some feelings of bloating in the abdomen. Passage of more gas than usual.  Walking can help get rid of the air that was put into your GI tract during the procedure and reduce the bloating. If you had a lower endoscopy (such as a colonoscopy or flexible sigmoidoscopy) you may notice spotting of blood in your stool or on the toilet paper. If you underwent a bowel prep for your procedure, you may not have a normal bowel movement for a few days.  Please Note:  You might notice some irritation and congestion in your nose or some drainage.  This is from the oxygen used during your procedure.  There is no need for concern and it should clear up in a day or so.  SYMPTOMS TO REPORT IMMEDIATELY:   Following upper endoscopy (EGD)  Vomiting of blood or coffee ground material  New chest pain or pain under the shoulder blades  Painful or persistently difficult swallowing  New shortness of breath  Fever of 100F or higher  Black, tarry-looking stools  For urgent or emergent issues, a gastroenterologist can be reached at any hour by calling [phone]. Do not use MyChart messaging for urgent  concerns.    DIET:  We do recommend a small meal at first, but then you may proceed to your regular diet.  Drink plenty of fluids but you should avoid alcoholic beverages for 24 hours.  ACTIVITY:  You should plan to take it easy for the rest of today and you should NOT DRIVE or use heavy machinery until tomorrow (because of the sedation medicines used during the test).    FOLLOW UP: Our staff will call the number listed on your records 48-72 hours following your procedure to check on you and address any questions or concerns that you may have regarding the information given to you following your procedure. If we do not reach you, we will leave a message.  We will attempt to reach you two times.  During this call, we will ask if you have developed any symptoms of COVID 19. If you develop any symptoms (ie: fever, flu-like symptoms, shortness of breath, cough etc.) before then, please call [phone].  If you test positive for Covid 19 in the 2 weeks post procedure, please call and report this information to [place].    If any biopsies were taken you will be contacted by phone or by letter within the next 1-3 weeks.  Please call us at [phone] if you have not heard about the biopsies in 3 weeks.    SIGNATURES/CONFIDENTIALITY: You and/or your care partner have signed paperwork which will be entered into your electronic medical record.  These signatures attest  to the fact that that the information above on your After Visit Summary has been reviewed and is understood.  Full responsibility of the confidentiality of this discharge information lies with you and/or your care-partner.

## 2021-03-12 ENCOUNTER — Telehealth: Payer: Self-pay | Admitting: *Deleted

## 2021-03-12 ENCOUNTER — Encounter: Payer: Self-pay | Admitting: Gastroenterology

## 2021-03-12 NOTE — Telephone Encounter (Signed)
  Follow up Call-  Call back number 03/10/2021  Post procedure Call Back phone  # [phone]     Patient questions:  Do you have a fever, pain , or abdominal swelling? No. Pain Score  0 *  Have you tolerated food without any problems? Yes.    Have you been able to return to your normal activities? Yes.    Do you have any questions about your discharge instructions: Diet   No. Medications  No. Follow up visit  No.  Do you have questions or concerns about your Care? No.  Actions: * If pain score is 4 or above: No action needed, pain <4.  Have you developed a fever since your procedure? no  2.   Have you had an respiratory symptoms (SOB or cough) since your procedure? no  3.   Have you tested positive for COVID 19 since your procedure no  4.   Have you had any family members/close contacts diagnosed with the COVID 19 since your procedure?  no   If yes to any of these questions please route to Joylene John, RN and Joella Prince, RN

## 2021-03-14 ENCOUNTER — Other Ambulatory Visit: Payer: Self-pay | Admitting: Family Medicine

## 2021-03-20 ENCOUNTER — Telehealth: Payer: Self-pay | Admitting: Gastroenterology

## 2021-03-20 NOTE — Telephone Encounter (Signed)
Patient mother calling  to inform patient is experiencing abd pain/vomiting.  Pt wants to know how to treat sxs..   Plz advise thank you

## 2021-03-20 NOTE — Telephone Encounter (Signed)
See patient message from 03/20/21.

## 2021-03-20 NOTE — Telephone Encounter (Signed)
Dr. Carlean Purl, as DOD PM of 7/8 please advise, thanks.  [place] patient with a history of Elevated AST (SGOT), Elevated ALT measurement, Elevated bilirubin, Other ascites, Lower extremity edema, Jaundice, Pleural effusion, Hypoalbuminemia, Loss of weight, Decreased appetite, and Dietary folate deficiency anemia.   Please see notes from patient.

## 2021-03-24 MED ORDER — SUCRALFATE 1 G PO TABS: 1.0000 g | ORAL_TABLET | Freq: Two times a day (BID) | ORAL | 0 refills | Status: DC

## 2021-04-01 ENCOUNTER — Other Ambulatory Visit: Payer: Self-pay

## 2021-04-02 ENCOUNTER — Encounter: Payer: Self-pay | Admitting: Family Medicine

## 2021-04-02 ENCOUNTER — Ambulatory Visit (INDEPENDENT_AMBULATORY_CARE_PROVIDER_SITE_OTHER): Payer: 59 | Admitting: Family Medicine

## 2021-04-02 VITALS — BP 100/68 | HR 82 | Temp 98.4°F | Ht 67.0 in | Wt 115.4 lb

## 2021-04-02 DIAGNOSIS — G629 Polyneuropathy, unspecified: Secondary | ICD-10-CM

## 2021-04-02 DIAGNOSIS — F419 Anxiety disorder, unspecified: Secondary | ICD-10-CM

## 2021-04-02 DIAGNOSIS — R590 Localized enlarged lymph nodes: Secondary | ICD-10-CM | POA: Diagnosis not present

## 2021-04-02 DIAGNOSIS — E049 Nontoxic goiter, unspecified: Secondary | ICD-10-CM | POA: Diagnosis not present

## 2021-04-02 LAB — CBC WITH DIFFERENTIAL/PLATELET
Basophils Absolute: 0.2 10*3/uL — ABNORMAL HIGH (ref 0.0–0.1)
Basophils Relative: 1.8 % (ref 0.0–3.0)
Eosinophils Absolute: 0.2 10*3/uL (ref 0.0–0.7)
Eosinophils Relative: 2 % (ref 0.0–5.0)
HCT: 33.2 % — ABNORMAL LOW (ref 36.0–46.0)
Hemoglobin: 10.9 g/dL — ABNORMAL LOW (ref 12.0–15.0)
Lymphocytes Relative: 31 % (ref 12.0–46.0)
Lymphs Abs: 2.8 10*3/uL (ref 0.7–4.0)
MCHC: 32.9 g/dL (ref 30.0–36.0)
MCV: 94.8 fl (ref 78.0–100.0)
Monocytes Absolute: 0.7 10*3/uL (ref 0.1–1.0)
Monocytes Relative: 8 % (ref 3.0–12.0)
Neutro Abs: 5.1 10*3/uL (ref 1.4–7.7)
Neutrophils Relative %: 57.2 % (ref 43.0–77.0)
Platelets: 382 10*3/uL (ref 150.0–400.0)
RBC: 3.51 Mil/uL — ABNORMAL LOW (ref 3.87–5.11)
RDW: 15.6 % — ABNORMAL HIGH (ref 11.5–15.5)
WBC: 8.9 10*3/uL (ref 4.0–10.5)

## 2021-04-02 LAB — TSH: TSH: 3.25 u[IU]/mL (ref 0.35–5.50)

## 2021-04-02 LAB — VITAMIN B12: Vitamin B-12: 260 pg/mL (ref 211–911)

## 2021-04-02 LAB — T4, FREE: Free T4: 0.64 ng/dL (ref 0.60–1.60)

## 2021-04-02 MED ORDER — GABAPENTIN 100 MG PO CAPS: ORAL_CAPSULE | ORAL | 1 refills | Status: DC

## 2021-04-02 NOTE — Progress Notes (Signed)
Jeanette Elliott is a 40 y.o. female  Chief Complaint  Patient presents with   Follow-up    4 wk f/u , Pt c/o swollen lymph nodes on both sides and back of neck, with pain ranges 7-8, and frequent at night x2 weeks.    HPI: Jeanette Elliott is a 40 y.o. female patient accompanied by her mother and complains of 2 week h/o "swollen lymph nodes" in her neck, bilaterally. These are painful, 7-8/10 in intensity. Pt states more symptomatic at night. She is unsure if her throat is sore, but states it hurts to swallow. Food is not getting stuck.  No fevers. No night sweats but pt states sometimes she wake up clammy at night and more so in the past 2 wks.  She notes a h/o Rt submandibular gland excision > 10 years ago.  CT chest in 01/2021 (done for fatigue, weight loss) --> tiny, bilateral thyroid nodules stable from prior CT, no f/u recommended  Pt also complains of tingling and numbness in her feet x months. Prevent her from sleeping well. She feels this negatively impacts her balance, hard to walk because she feels unsteady or doesn't sense the ground. She has fallen, although not in the past few weeks. PT was recommended but mom still needs to look into what places are in-network. Pt has not tried anything to help with symptoms.   She has [place] appt on 04/23/21 and next appt with [facility] hepatology is 04/14/21. Ophthalmology appt in 05/2021.   Past Medical History:  Diagnosis Date   Asthma    Elevated liver enzymes    Jaundice    Sjogren's syndrome ([place])     Past Surgical History:  Procedure Laterality Date   IR PARACENTESIS  02/03/2021   IR THORACENTESIS ASP PLEURAL SPACE W/IMG GUIDE  01/16/2021   SUBMANDIBULAR GLAND EXCISION     TONSILLECTOMY      Social History   Socioeconomic History   Marital status: Divorced    Spouse name: Not on file   Number of children: Not on file   Years of education: Not on file   Highest education level: Not on file  Occupational History   Not on file  Tobacco  Use   Smoking status: Never   Smokeless tobacco: Never  Vaping Use   Vaping Use: Never used  Substance and Sexual Activity   Alcohol use: Not Currently   Drug use: Not Currently   Sexual activity: Not on file  Other Topics Concern   Not on file  Social History Narrative   Not on file   Social Determinants of Health   Financial Resource Strain: Not on file  Food Insecurity: Not on file  Transportation Needs: Not on file  Physical Activity: Not on file  Stress: Not on file  Social Connections: Not on file  Intimate Partner Violence: Not on file    Family History  Problem Relation Age of Onset   Colon cancer Neg Hx    Esophageal cancer Neg Hx      Immunization History  Administered Date(s) Administered   Moderna Sars-Covid-2 Vaccination 05/02/2020    Outpatient Encounter Medications as of 04/02/2021  Medication Sig   albuterol (VENTOLIN HFA) 108 (90 Base) MCG/ACT inhaler Inhale 2 puffs into the lungs every 6 (six) hours as needed (asthma attacks).   EPINEPHrine 0.3 mg/0.3 mL IJ SOAJ injection Inject 0.3 mg into the muscle once as needed for anaphylaxis.   escitalopram (LEXAPRO) 10 MG tablet Take 1 tablet (10  mg total) by mouth daily. (Patient taking differently: Take 20 mg by mouth daily.)   folic acid (FOLVITE) 1 MG tablet Take 1 tablet (1 mg total) by mouth daily.   furosemide (LASIX) 20 MG tablet Take 20 mg by mouth daily.   hydrOXYzine (ATARAX/VISTARIL) 25 MG tablet TAKE 2 TABLETS(50 MG) BY MOUTH AT BEDTIME   MAGNESIUM PO Take 1 tablet by mouth daily.   midodrine (PROAMATINE) 5 MG tablet Take by mouth.   Naphazoline HCl (CLEAR EYES OP) Place 1 drop into both eyes 2 (two) times daily.   pantoprazole (PROTONIX) 40 MG tablet Take 1 tablet (40 mg total) by mouth 2 (two) times daily. Protonix ( pantoprazole ) 40 mg twice daily for 6 weeks, then reduce to 40 mg daily and slowly titrate off.   spironolactone (ALDACTONE) 50 MG tablet Take 1 tablet by mouth daily.   sucralfate  (CARAFATE) 1 g tablet Take 1 tablet (1 g total) by mouth 2 (two) times daily for 21 days. Mix tablet with one tablespoon of warm distilled water to make a slurry.   VITAMIN A PO Take 1 tablet by mouth daily.   Vitamin D, Ergocalciferol, (DRISDOL) 1.25 MG (50000 UNIT) CAPS capsule Take 50,000 Units by mouth every 7 (seven) days.   ZOLMitriptan (ZOMIG) 2.5 MG tablet Take by mouth.   potassium chloride SA (KLOR-CON) 20 MEQ tablet Take 1 tablet (20 mEq total) by mouth daily.   spironolactone (ALDACTONE) 25 MG tablet Take 2 tablets (50 mg total) by mouth daily.   ZOLMitriptan (ZOMIG) 2.5 MG tablet Take 1 tablet (2.5 mg total) by mouth once for 1 dose. May repeat in 2 hours if headache persists or recurs.   [DISCONTINUED] Multiple Vitamins-Minerals (ADULT ONE DAILY GUMMIES) CHEW Chew 2 tablets by mouth daily. (Patient not taking: Reported on 04/02/2021)   No facility-administered encounter medications on file as of 04/02/2021.     ROS: Pertinent positives and negatives noted in HPI. Remainder of ROS non-contributory   Allergies  Allergen Reactions   Latex Itching and Rash    BP 100/68 (BP Location: Left Arm, Patient Position: Sitting, Cuff Size: Normal)   Pulse 82   Temp 98.4 F (36.9 C) (Temporal)   Ht 5' 7"  (1.702 m)   Wt 115 lb 6.4 oz (52.3 kg)   SpO2 98%   BMI 18.07 kg/m   Physical Exam HENT:     Mouth/Throat:     Mouth: Mucous membranes are moist.     Pharynx: Oropharynx is clear. No oropharyngeal exudate or posterior oropharyngeal erythema.  Neck:     Thyroid: Thyromegaly (borderline normal/slightyl enlarged) present. No thyroid tenderness.  Musculoskeletal:        General: Tenderness present.     Cervical back: Normal range of motion and neck supple. No tenderness.     Right lower leg: No edema.     Left lower leg: No edema.  Lymphadenopathy:     Cervical: No cervical adenopathy.     Right cervical: No superficial, deep or posterior cervical adenopathy.    Left cervical:  No superficial, deep or posterior cervical adenopathy.  Skin:    Coloration: Skin is jaundiced.  Neurological:     Mental Status: She is alert.     A/P:  1. Enlarged lymph node in neck - palpable anterior cervical LN palpable B/L but not tender or enlarged on exam. Pt concerned bc she states she "has never been able to feel them before" - pt unsure about sore throat but  does feel her throat hurts to swallow ? - CBC w/Diff - Epstein-Barr virus nuclear antigen antibody, IgG - Epstein-Barr virus VCA antibody panel - US Soft Tissue Head/Neck (NON-THYROID); Future  2. Enlarged thyroid - borderline - US THYROID; Future - TSH - T4, free  3. 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 Anxiety - pt has [place] counseling appt on 04/23/21 - trying to establish with psychiatrist as well, but psych at [facility] where she is going to see [facility] counselor is not taking new pts - cont lexapro 25m daily, vistaril [id number] - pt also needs substance abuse counseling and mom understands this   I spent 40 min with the patient today and greater than 50% was spent in counseling, coordination of care, education

## 2021-04-02 NOTE — Patient Instructions (Signed)
Consider PT - [facility], [facility]  [facility]    [email or web address]  [facility] [email or web address]  [place]

## 2021-04-03 ENCOUNTER — Encounter: Payer: Self-pay | Admitting: Family Medicine

## 2021-04-03 LAB — EPSTEIN-BARR VIRUS VCA ANTIBODY PANEL
EBV NA IgG: >600 U/mL — ABNORMAL HIGH
EBV VCA IgG: >750 U/mL — ABNORMAL HIGH
EBV VCA IgM: <36 U/mL

## 2021-04-09 ENCOUNTER — Other Ambulatory Visit: Payer: Self-pay

## 2021-04-09 ENCOUNTER — Ambulatory Visit (HOSPITAL_BASED_OUTPATIENT_CLINIC_OR_DEPARTMENT_OTHER)
Admission: RE | Admit: 2021-04-09 | Discharge: 2021-04-09 | Disposition: A | Discharge status: Home / Self Care | Payer: 59 | Source: Ambulatory Visit | Attending: Family Medicine | Admitting: Family Medicine

## 2021-04-09 ENCOUNTER — Other Ambulatory Visit: Payer: Self-pay | Admitting: Family Medicine

## 2021-04-09 DIAGNOSIS — R590 Localized enlarged lymph nodes: Secondary | ICD-10-CM | POA: Insufficient documentation

## 2021-04-09 DIAGNOSIS — E049 Nontoxic goiter, unspecified: Secondary | ICD-10-CM

## 2021-04-10 MED ORDER — ESCITALOPRAM OXALATE 20 MG PO TABS: 20.0000 mg | ORAL_TABLET | Freq: Every day | ORAL | 1 refills | Status: DC

## 2021-04-14 ENCOUNTER — Other Ambulatory Visit: Payer: Self-pay | Admitting: Gastroenterology

## 2021-04-14 DIAGNOSIS — K7011 Alcoholic hepatitis with ascites: Secondary | ICD-10-CM | POA: Insufficient documentation

## 2021-04-15 ENCOUNTER — Telehealth: Payer: Self-pay | Admitting: Family Medicine

## 2021-04-15 ENCOUNTER — Other Ambulatory Visit: Payer: Self-pay | Admitting: Family Medicine

## 2021-04-15 DIAGNOSIS — R519 Headache, unspecified: Secondary | ICD-10-CM

## 2021-04-15 NOTE — Telephone Encounter (Signed)
Pt's mom called in wanted in wanting her daughter to have a referral for her blurred vision.

## 2021-04-15 NOTE — Telephone Encounter (Signed)
Pt's mom

## 2021-04-16 NOTE — Telephone Encounter (Signed)
She has seen an Optometrist and was told her blurred vision was not because of any eye disorder. She is now wanting a referral to a Neurologist to rule out that reason. She would like to go to [facility] [phone] or [facility] at [phone]. She said she has spoken with Dr. Loletha Grayer concerning this issue. Her vision has gotten much worse. Please advise  mom(Vicky) at [phone].

## 2021-04-22 ENCOUNTER — Encounter: Payer: Self-pay | Admitting: Family Medicine

## 2021-04-22 DIAGNOSIS — R519 Headache, unspecified: Secondary | ICD-10-CM

## 2021-04-22 MED ORDER — ZOLMITRIPTAN 2.5 MG PO TABS: 2.5000 mg | ORAL_TABLET | Freq: Once | ORAL | 3 refills | Status: DC

## 2021-04-23 ENCOUNTER — Ambulatory Visit (INDEPENDENT_AMBULATORY_CARE_PROVIDER_SITE_OTHER): Payer: 59 | Admitting: Psychiatry

## 2021-04-23 ENCOUNTER — Encounter: Payer: Self-pay | Admitting: Psychiatry

## 2021-04-23 ENCOUNTER — Other Ambulatory Visit: Payer: Self-pay

## 2021-04-23 DIAGNOSIS — F411 Generalized anxiety disorder: Secondary | ICD-10-CM
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
## 2021-04-23 NOTE — Telephone Encounter (Signed)
Spoke to patient VIA phone, scheduled her for TOC on 8/23rd  @2 :00 pm.  Dm/cma

## 2021-04-24 ENCOUNTER — Other Ambulatory Visit: Payer: Self-pay | Admitting: Family Medicine

## 2021-04-24 NOTE — Telephone Encounter (Signed)
refiIll request for:  Spirolactone 50 mg LR HX provider LOV  04/02/21  (Dr Bryan Lemma) [place]  05/05/21  (Dr Gena Fray)  Please review and advise.   Thanks.  Dm/cma

## 2021-05-05 ENCOUNTER — Ambulatory Visit (INDEPENDENT_AMBULATORY_CARE_PROVIDER_SITE_OTHER): Payer: 59 | Admitting: Family Medicine

## 2021-05-05 ENCOUNTER — Encounter: Payer: Self-pay | Admitting: Family Medicine

## 2021-05-05 ENCOUNTER — Other Ambulatory Visit: Payer: Self-pay

## 2021-05-05 VITALS — BP 100/70 | HR 95 | Temp 98.4°F | Ht 67.0 in | Wt 134.0 lb

## 2021-05-05 DIAGNOSIS — K7460 Unspecified cirrhosis of liver: Secondary | ICD-10-CM

## 2021-05-05 DIAGNOSIS — F419 Anxiety disorder, unspecified: Secondary | ICD-10-CM | POA: Diagnosis not present

## 2021-05-05 DIAGNOSIS — Z91038 Other insect allergy status: Secondary | ICD-10-CM | POA: Insufficient documentation

## 2021-05-05 DIAGNOSIS — R188 Other ascites: Secondary | ICD-10-CM | POA: Diagnosis not present

## 2021-05-05 DIAGNOSIS — G629 Polyneuropathy, unspecified: Secondary | ICD-10-CM | POA: Insufficient documentation

## 2021-05-05 DIAGNOSIS — D539 Nutritional anemia, unspecified: Secondary | ICD-10-CM

## 2021-05-05 DIAGNOSIS — K269 Duodenal ulcer, unspecified as acute or chronic, without hemorrhage or perforation: Secondary | ICD-10-CM | POA: Insufficient documentation

## 2021-05-05 DIAGNOSIS — R519 Headache, unspecified: Secondary | ICD-10-CM

## 2021-05-05 DIAGNOSIS — F1010 Alcohol abuse, uncomplicated: Secondary | ICD-10-CM

## 2021-05-05 DIAGNOSIS — J45909 Unspecified asthma, uncomplicated: Secondary | ICD-10-CM | POA: Insufficient documentation

## 2021-05-05 DIAGNOSIS — E538 Deficiency of other specified B group vitamins: Secondary | ICD-10-CM

## 2021-05-05 DIAGNOSIS — G621 Alcoholic polyneuropathy: Secondary | ICD-10-CM

## 2021-05-05 DIAGNOSIS — H547 Unspecified visual loss: Secondary | ICD-10-CM

## 2021-05-05 MED ORDER — PANTOPRAZOLE SODIUM 40 MG PO TBEC: 40.0000 mg | DELAYED_RELEASE_TABLET | Freq: Two times a day (BID) | ORAL | 0 refills | Status: DC

## 2021-05-05 MED ORDER — ZOLMITRIPTAN 2.5 MG PO TABS: 2.5000 mg | ORAL_TABLET | Freq: Once | ORAL | 3 refills | Status: AC

## 2021-05-05 MED ORDER — GABAPENTIN 100 MG PO CAPS: 100.0000 mg | ORAL_CAPSULE | Freq: Three times a day (TID) | ORAL | 3 refills | Status: DC

## 2021-05-05 NOTE — Progress Notes (Signed)
[facility] [address] [place] [address] Dept: [phone] Dept Fax: [phone]  Transfer of Care Office Visit  Subjective:    Patient ID: Jeanette Elliott, female    DOB: 05/10/1981, 40 y.o..   MRN: [id number]  Chief Complaint  Patient presents with   Transitions Of Care    Med recheck/ refills. Referral for neurologist.   History of Present Illness:  Patient is in today to establish care. [name] is originally from [facility] and has always lived in this area. She went to college at [employer] in Education officer, museum. She has worked previously as a Training and development officer, but has been out of work over the past year due to health issues. She has a history of alcohol abuse, but is currently maintaining her sobriety. She denies tobacco or drug use.  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 Earlier this summer, she had been on a transplant list. However, she notes her MELD score has decreased and she is now no longer being considered for transplant. [name] has had prior issues with ascites, edema, jaundice, anemia, and malnourishment, related to her liver condition. She notes she had an EGD earlier this summer which demonstrated a duodenal ulcer. She had bene on Carafate, but no longer. She took Protonix for about a month, but isn't sure if she should continue. Her edema is managed with Lasix and spironolactone. She is also on a potassium supplement.  [name] has a history of depression. She is being managed on Lexapro. However, she notes that this leaves her feeling as if she has no emotions. She is engaged in counseling, though her sessions have been infrequent. She is trying to get in to see a psychiatrist.  [name] has had issues with peripheral neuropathy. She was started on gabapentin for this, initially at 100 mg qhs, then increasing to 200 mg. She notes she still has significant pain, as well as numbness int he feet,  which has contributed to falls. [name] also has chronic headaches. She has used Zomig for these and notes this actually sometimes reduces her neuropathic pain. [name] also has developed poor vision. She was seen by an optometrist, who felt the visual changes were due to a brain issue. She has an upcoming appointment with a ophthalmologist.  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 01/75/1025   Alcohol abuse 03/04/2021   Hypophosphatasia    Constipation    Hypokalemia 02/24/2021   Generalized weakness 02/20/2021   Abdominal distention    Anxiety    Cirrhosis ([place]) 02/02/2021   Pleural effusion    Protein-calorie malnutrition, severe 01/16/2021   S/P thoracentesis    Hemochromatosis    Closed fracture of rib of left side with routine healing    Macrocytic anemia    Folate deficiency    Fall    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   Sjogren syndrome, unspecified ([place]) 07/20/2016   Deficiency of other specified B group vitamins 01/28/2016   Major depressive disorder, recurrent episode ([place]) 05/16/2013   Past Surgical History:  Procedure Laterality Date   IR PARACENTESIS  02/03/2021   IR THORACENTESIS ASP PLEURAL SPACE W/IMG GUIDE  01/16/2021   ORIF FINGER / THUMB FRACTURE Left    Thumb   SUBMANDIBULAR GLAND EXCISION     TONSILLECTOMY AND ADENOIDECTOMY  Family History  Problem Relation Age of Onset   Arthritis Mother    Heart disease Father    Cancer Father        Prostate   Arthritis Father    Heart disease Paternal Uncle    Heart disease Maternal Grandmother    Stroke Maternal Grandmother    Depression Maternal Grandmother    Alzheimer's disease Maternal Grandfather    Heart disease Paternal Grandfather    Colon cancer Neg Hx    Esophageal cancer Neg Hx    Outpatient Medications Prior to Visit  Medication Sig  Dispense Refill   albuterol (VENTOLIN HFA) 108 (90 Base) MCG/ACT inhaler Inhale 2 puffs into the lungs every 6 (six) hours as needed (asthma attacks). 8 g 1   EPINEPHrine 0.3 mg/0.3 mL IJ SOAJ injection Inject 0.3 mg into the muscle once as needed for anaphylaxis. 1 each 0   escitalopram (LEXAPRO) 20 MG tablet Take 1 tablet (20 mg total) by mouth daily. 90 tablet 1   folic acid (FOLVITE) 1 MG tablet TAKE 1 TABLET(1 MG) BY MOUTH DAILY 90 tablet 1   furosemide (LASIX) 20 MG tablet Take 20 mg by mouth daily.     hydrOXYzine (ATARAX/VISTARIL) 25 MG tablet TAKE 2 TABLETS(50 MG) BY MOUTH AT BEDTIME 180 tablet 1   MAGNESIUM PO Take 1 tablet by mouth daily.     midodrine (PROAMATINE) 5 MG tablet Take by mouth.     Naphazoline HCl (CLEAR EYES OP) Place 1 drop into both eyes 2 (two) times daily.     spironolactone (ALDACTONE) 50 MG tablet TAKE 1 TABLET(50 MG) BY MOUTH EVERY MORNING 90 tablet 0   VITAMIN A PO Take 1 tablet by mouth daily.     Vitamin D, Ergocalciferol, (DRISDOL) 1.25 MG (50000 UNIT) CAPS capsule Take 50,000 Units by mouth every 7 (seven) days.     gabapentin (NEURONTIN) 100 MG capsule 1 cap at bedtime, may increase to 2 cap after 1 week if needed 60 capsule 1   pantoprazole (PROTONIX) 40 MG tablet Take 1 tablet (40 mg total) by mouth 2 (two) times daily. Protonix ( pantoprazole ) 40 mg twice daily for 6 weeks, then reduce to 40 mg daily and slowly titrate off. 90 tablet 3   potassium chloride SA (KLOR-CON) 20 MEQ tablet Take 1 tablet (20 mEq total) by mouth daily. 30 tablet 0   spironolactone (ALDACTONE) 25 MG tablet Take 2 tablets (50 mg total) by mouth daily. 60 tablet 0   sucralfate (CARAFATE) 1 g tablet Take 1 tablet (1 g total) by mouth 2 (two) times daily for 21 days. Mix tablet with one tablespoon of warm distilled water to make a slurry. 42 tablet 0   ZOLMitriptan (ZOMIG) 2.5 MG tablet Take 1 tablet (2.5 mg total) by mouth once for 1 dose. May repeat in 2 hours if headache persists or  recurs. 15 tablet 3   No facility-administered medications prior to visit.   Allergies  Allergen Reactions   Latex Itching and Rash     Objective:   Today's Vitals   05/05/21 1416  BP: 100/70  Pulse: 95  Temp: 98.4 F (36.9 C)  TempSrc: Temporal  SpO2: 99%  Weight: 134 lb (60.8 kg)  Height: 5' 7"  (1.702 m)   Body mass index is 20.99 kg/m.   General: Well developed, well nourished. No acute distress. Extremities: No edema noted. Psych: Alert and oriented. Normal mood and affect.  Health Maintenance Due  Topic Date Due  Pneumococcal Vaccine 16-103 Years old (1 - PCV) Never done   TETANUS/TDAP  Never done   PAP SMEAR-Modifier  Never done   INFLUENZA VACCINE  04/13/2021     Assessment & Plan:   1. Cirrhosis of liver with ascites, unspecified hepatic cirrhosis type [facility]) Ms. January has a history of compensated cirrhosis. I will check labs to assess the current impact that this is having for her.  - Comprehensive metabolic panel - TSH - CBC - VITAMIN D 25 Hydroxy (Vit-D Deficiency, Fractures) - Iron, TIBC and Ferritin Panel - Phosphorus - Magnesium  2. Macrocytic anemia I will reassess her B12 and folate levels.  - Vitamin B12 - Folate  3. Anxiety Currently managed on Lexapro. She will continue to engage in counseling and pursue a psychaitry assessment.  4. Alcohol abuse Maintaining sobriety at present.  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  - gabapentin (NEURONTIN) 100 MG capsule; Take 1 capsule (100 mg total) by mouth 3 (three) times daily. 1 cap at bedtime, may increase to 2 cap after 1 week if needed  Dispense: 90 capsule; Refill: 3  6. Duodenal ulcer I recommend she continue on a PPI for another 2 months.  - pantoprazole (PROTONIX) 40 MG tablet; Take 1 tablet (40 mg total) by mouth 2 (two) times daily. Protonix ( pantoprazole ) 40 mg twice daily for 6 weeks, then reduce to  40 mg daily and slowly titrate off.  Dispense: 60 tablet; Refill: 0  7. Nonintractable episodic headache, unspecified headache type We will cotninue the Zomig for now, as she reports benefit fromt his.  - ZOLMitriptan (ZOMIG) 2.5 MG tablet; Take 1 tablet (2.5 mg total) by mouth once for 1 dose. May repeat in 2 hours if headache persists or recurs.  Dispense: 20 tablet; Refill: 3  8. Poor vision Scheduled with ophthalmology.  Haydee Salter, MD

## 2021-05-06 ENCOUNTER — Encounter: Payer: Self-pay | Admitting: Family Medicine

## 2021-05-06 DIAGNOSIS — D649 Anemia, unspecified: Secondary | ICD-10-CM | POA: Insufficient documentation

## 2021-05-06 DIAGNOSIS — E538 Deficiency of other specified B group vitamins: Secondary | ICD-10-CM | POA: Insufficient documentation

## 2021-05-06 LAB — COMPREHENSIVE METABOLIC PANEL
ALT: 19 U/L (ref 0–35)
AST: 35 U/L (ref 0–37)
Albumin: 4.1 g/dL (ref 3.5–5.2)
Alkaline Phosphatase: 114 U/L (ref 39–117)
BUN: 18 mg/dL (ref 6–23)
CO2: 22 mEq/L (ref 19–32)
Calcium: 10.5 mg/dL (ref 8.4–10.5)
Chloride: 99 mEq/L (ref 96–112)
Creatinine, Ser: 0.72 mg/dL (ref 0.40–1.20)
GFR: 105.77 mL/min (ref >60.00)
Glucose, Bld: 82 mg/dL (ref 70–99)
Potassium: 4 mEq/L (ref 3.5–5.1)
Sodium: 136 mEq/L (ref 135–145)
Total Bilirubin: 1 mg/dL (ref 0.2–1.2)
Total Protein: 7.7 g/dL (ref 6.0–8.3)

## 2021-05-06 LAB — VITAMIN D 25 HYDROXY (VIT D DEFICIENCY, FRACTURES): VITD: 41.05 ng/mL (ref 30.00–100.00)

## 2021-05-06 LAB — IRON,TIBC AND FERRITIN PANEL
%SAT: 11 % (calc) — ABNORMAL LOW (ref 16–45)
Ferritin: 49 ng/mL (ref 16–154)
Iron: 49 ug/dL (ref 40–190)
TIBC: 461 mcg/dL (calc) — ABNORMAL HIGH (ref 250–450)

## 2021-05-06 LAB — CBC
HCT: 35.2 % — ABNORMAL LOW (ref 36.0–46.0)
Hemoglobin: 11.6 g/dL — ABNORMAL LOW (ref 12.0–15.0)
MCHC: 32.9 g/dL (ref 30.0–36.0)
MCV: 92.5 fl (ref 78.0–100.0)
Platelets: 329 10*3/uL (ref 150.0–400.0)
RBC: 3.8 Mil/uL — ABNORMAL LOW (ref 3.87–5.11)
RDW: 14.8 % (ref 11.5–15.5)
WBC: 8 10*3/uL (ref 4.0–10.5)

## 2021-05-06 LAB — VITAMIN B12: Vitamin B-12: 182 pg/mL — ABNORMAL LOW (ref 211–911)

## 2021-05-06 LAB — TSH: TSH: 2.02 u[IU]/mL (ref 0.35–5.50)

## 2021-05-06 LAB — PHOSPHORUS: Phosphorus: 5.9 mg/dL — ABNORMAL HIGH (ref 2.3–4.6)

## 2021-05-06 LAB — FOLATE: Folate: >24.4 ng/mL (ref >5.9)

## 2021-05-06 LAB — MAGNESIUM: Magnesium: 1.8 mg/dL (ref 1.5–2.5)

## 2021-05-06 MED ORDER — VITAMIN B-12 1000 MCG PO TABS: 1000.0000 ug | ORAL_TABLET | Freq: Every day | ORAL | 2 refills | Status: DC

## 2021-05-06 MED ORDER — HYDROXYZINE HCL 25 MG PO TABS: 50.0000 mg | ORAL_TABLET | Freq: Two times a day (BID) | ORAL | 6 refills | Status: DC | PRN

## 2021-05-06 NOTE — Addendum Note (Signed)
Addended by: Haydee Salter on: 05/06/2021 06:00 PM   Modules accepted: Orders

## 2021-05-08 ENCOUNTER — Other Ambulatory Visit: Payer: Self-pay

## 2021-05-08 ENCOUNTER — Ambulatory Visit (INDEPENDENT_AMBULATORY_CARE_PROVIDER_SITE_OTHER): Payer: 59 | Admitting: Psychiatry

## 2021-05-08 DIAGNOSIS — F411 Generalized anxiety disorder: Secondary | ICD-10-CM | POA: Diagnosis not present

## 2021-05-08 NOTE — Progress Notes (Signed)
      [facility] Counselor/Therapist Progress Note  Patient ID: Jeanette Elliott, MRN: [id number],    Date: 05/08/2021  Time Spent: 51 minutes start time 9:04 AM end time 9:55 AM  Treatment Type: Individual Therapy  Reported Symptoms: pain issues, sleep issues,anxiety,panic attack, intrusive thoughts, sadness, irritability  Mental Status Exam:  Appearance:   Casual     Behavior:  Appropriate  Motor:  Normal  Speech/Language:   Normal Rate  Affect:  Appropriate  Mood:  anxious  Thought process:  normal  Thought content:    WNL  Sensory/Perceptual disturbances:    WNL  Orientation:  oriented to person, place, time/date, and situation  Attention:  Good  Concentration:  Good  Memory:  WNL  Fund of knowledge:   Fair  Insight:    Fair  Judgment:   Good  Impulse Control:  Good   Risk Assessment: Danger to Self:   did have thoughts of self harm when she woke due to pain and being out of a medication, she contracts for safety  Self-injurious Behavior: No Danger to Others: No Duty to Warn:no Physical Aggression / Violence:No  Access to Firearms a concern: No  Gang Involvement:No   Subjective: Patient was present for session.  She shared she is out of 1 of her medications discussed getting her with a provider for medication. She shared that she is still having health issues but her liver numbers are much better. She shared she has been triggered lately due to an ex boyfriend that is currently married has been calling her.  Patient was allowed time to discuss her relationship history and trauma she is in toward from those different situations.  Patient was able to recognize that trauma history definitely impacted her drinking which led to her medical issues.  Patient reported she continues to be sober.  Patient was taught grounding exercises in session.  She was able to do some problem solving on her need for community service hours and plan was developed in session.  Interventions:  Solution-Oriented/Positive Psychology  Diagnosis:   ICD-10-CM   1. Generalized anxiety disorder  F41.1       Plan: Patient is to use grounding exercises to decrease anxiety symptoms.  Patient is to follow plan from session to deal with getting her community service hours.  Patient is to continue working with providers on her medical issues.  Patient is to schedule appointment with Lesle Chris NP at practice for medication assessment.  [name], [facility]

## 2021-05-14 ENCOUNTER — Ambulatory Visit (INDEPENDENT_AMBULATORY_CARE_PROVIDER_SITE_OTHER): Payer: 59 | Admitting: Psychiatry

## 2021-05-14 ENCOUNTER — Encounter: Payer: Self-pay | Admitting: Psychiatry

## 2021-05-14 ENCOUNTER — Other Ambulatory Visit: Payer: Self-pay

## 2021-05-14 DIAGNOSIS — F411 Generalized anxiety disorder: Secondary | ICD-10-CM

## 2021-05-14 NOTE — Progress Notes (Signed)
      [facility] Counselor/Therapist Progress Note  Patient ID: Jeanette Elliott, MRN: [id number],    Date: 05/14/2021  Time Spent: 50 minutes start time 11:12 AM end time 12:02 PM  Treatment Type: Individual Therapy  Reported Symptoms: anxiety, fatigue, health issues,anger, low motivation, migraines  Mental Status Exam:  Appearance:   Casual     Behavior:  Appropriate  Motor:  Normal  Speech/Language:   Normal Rate  Affect:  Appropriate  Mood:  frustrated  Thought process:  normal  Thought content:    WNL  Sensory/Perceptual disturbances:    Headache   Orientation:  oriented to person, place, time/date, and situation  Attention:  Good  Concentration:  Good  Memory:  WNL  Fund of knowledge:   Good  Insight:    Good  Judgment:   Good  Impulse Control:  Good   Risk Assessment: Danger to Self:  No Self-injurious Behavior: No Danger to Others: No Duty to Warn:no Physical Aggression / Violence:No  Access to Firearms a concern: No  Gang Involvement:No   Subjective: Patient was present for session.  She reported she is doing better with her liver levels but her iron level is still low.  She went on to share she has been having migraines and she is frustrated over several different issues.  She shared that it just seems nothing goes the way that it is supposed to.  Patient explained she is continuing to have to work with physicians on the different medical issues.  She shared she is very frustrated because she wants to be working again and feel independent but due to her medical problems and difficulty with her sight she is unable to have any sort of job.  Patient explained she gets very depressed and lonely and is trying to figure out if there is anything she can do with her time.  Patient was encouraged to continue thinking through what she may be able to do even with her limited abilities.  Patient was encouraged to try and find ways to release negative emotions appropriately.  The  possibility of going to a support group was discussed with patient.  Patient was encouraged to remind herself that she is enough and she still has purpose even when it is difficult for her to see.  Agreed to work on self talk and staying focused on a positive future.  Interventions: Cognitive Behavioral Therapy and Solution-Oriented/Positive Psychology  Diagnosis:   ICD-10-CM   1. Generalized anxiety disorder  F41.1       Plan: Patient is to use CBT and coping skills to decrease anxiety symptoms.  Patient is to try and focus on the things that she can control fix and change and to focus on the fact that she still has a purpose.  Patient is to find a support group to connect with others through.  Patient is to take medication as directed.  [name], [facility]

## 2021-05-19 ENCOUNTER — Encounter: Payer: Self-pay | Admitting: Family Medicine

## 2021-05-20 ENCOUNTER — Other Ambulatory Visit: Payer: Self-pay

## 2021-05-20 ENCOUNTER — Encounter: Payer: Self-pay | Admitting: Neurology

## 2021-05-20 ENCOUNTER — Ambulatory Visit (INDEPENDENT_AMBULATORY_CARE_PROVIDER_SITE_OTHER): Payer: 59 | Admitting: Neurology

## 2021-05-20 VITALS — BP 113/70 | HR 96 | Ht 67.0 in | Wt 140.5 lb

## 2021-05-20 DIAGNOSIS — G629 Polyneuropathy, unspecified: Secondary | ICD-10-CM

## 2021-05-20 DIAGNOSIS — G4700 Insomnia, unspecified: Secondary | ICD-10-CM

## 2021-05-20 DIAGNOSIS — E538 Deficiency of other specified B group vitamins: Secondary | ICD-10-CM

## 2021-05-20 DIAGNOSIS — E46 Unspecified protein-calorie malnutrition: Secondary | ICD-10-CM | POA: Diagnosis not present

## 2021-05-20 DIAGNOSIS — H547 Unspecified visual loss: Secondary | ICD-10-CM

## 2021-05-20 DIAGNOSIS — M3500 Sicca syndrome, unspecified: Secondary | ICD-10-CM

## 2021-05-20 MED ORDER — GABAPENTIN 300 MG PO CAPS: 300.0000 mg | ORAL_CAPSULE | Freq: Three times a day (TID) | ORAL | 11 refills | Status: DC
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
## 2021-05-26 ENCOUNTER — Ambulatory Visit: Payer: 59 | Admitting: Behavioral Health

## 2021-05-26 LAB — MULTIPLE MYELOMA PANEL, SERUM
Albumin SerPl Elph-Mcnc: 3.8 g/dL (ref 2.9–4.4)
Albumin/Glob SerPl: 1.2 (ref 0.7–1.7)
Alpha 1: 0.2 g/dL (ref 0.0–0.4)
Alpha2 Glob SerPl Elph-Mcnc: 0.7 g/dL (ref 0.4–1.0)
B-Globulin SerPl Elph-Mcnc: 1.2 g/dL (ref 0.7–1.3)
Gamma Glob SerPl Elph-Mcnc: 1.2 g/dL (ref 0.4–1.8)
Globulin, Total: 3.3 g/dL (ref 2.2–3.9)
IgA/Immunoglobulin A, Serum: 239 mg/dL (ref 87–352)
IgG (Immunoglobin G), Serum: 1138 mg/dL (ref 586–1602)
IgM (Immunoglobulin M), Srm: 199 mg/dL (ref 26–217)
Total Protein: 7.1 g/dL (ref 6.0–8.5)

## 2021-05-26 LAB — COPPER, SERUM: Copper: 116 ug/dL (ref 80–158)

## 2021-05-26 LAB — VITAMIN B1: Thiamine: 131.6 nmol/L (ref 66.5–200.0)

## 2021-05-26 LAB — CRYOGLOBULIN

## 2021-05-26 LAB — SJOGREN'S SYNDROME ANTIBODS(SSA + SSB)
ENA SSA (RO) Ab: <0.2 AI (ref 0.0–0.9)
ENA SSB (LA) Ab: <0.2 AI (ref 0.0–0.9)

## 2021-05-26 LAB — RHEUMATOID FACTOR: Rheumatoid fact SerPl-aCnc: <10 IU/mL (ref <14.0)

## 2021-05-26 LAB — VITAMIN B6: Vitamin B6: 7 ug/L (ref 3.4–65.2)

## 2021-05-27 ENCOUNTER — Encounter (INDEPENDENT_AMBULATORY_CARE_PROVIDER_SITE_OTHER): Payer: 59 | Admitting: Neurology

## 2021-05-27 ENCOUNTER — Ambulatory Visit (INDEPENDENT_AMBULATORY_CARE_PROVIDER_SITE_OTHER): Payer: 59 | Admitting: Neurology

## 2021-05-27 DIAGNOSIS — E538 Deficiency of other specified B group vitamins: Secondary | ICD-10-CM | POA: Diagnosis not present

## 2021-05-27 DIAGNOSIS — E46 Unspecified protein-calorie malnutrition: Secondary | ICD-10-CM | POA: Diagnosis not present

## 2021-05-27 DIAGNOSIS — M3500 Sicca syndrome, unspecified: Secondary | ICD-10-CM

## 2021-05-27 DIAGNOSIS — G629 Polyneuropathy, unspecified: Secondary | ICD-10-CM

## 2021-05-27 DIAGNOSIS — Z0289 Encounter for other administrative examinations: Secondary | ICD-10-CM

## 2021-05-27 MED ORDER — CYANOCOBALAMIN 1000 MCG/ML IJ KIT: 1.0000 mL | PACK | INTRAMUSCULAR | 4 refills | Status: DC

## 2021-05-27 NOTE — Progress Notes (Signed)
Full Name: Regis Wiland Gender: Female MRN #: [id number] Date of Birth: [birth year]    Visit Date: 05/27/2021 12:37 Age: 40 Years Examining Physician: Arlice Colt, MD  Referring Physician: Arlice Colt, MD      History: Jeanette Elliott is a 40 year old woman with numbness in the feet greater than the hands and pain in the feet since around April 2022.  Symptoms actually are slightly better now than they were a month ago.  Besides numbness she also had visual changes that have been stable over the last couple months.  Of note, she had severe malnutrition associated with reduced appetite, nausea liver disorder.  On exam, she has mildly reduced vibration sensation at the ankles and moderately reduced vibration sensation at the toes and reduced touch and pinprick sensation in the feet and ankles.  Strength was 4+/5 in the foot.  Nerve conduction studies: The left median and ulnar motor responses had normal distal latencies, amplitudes and conduction velocities.  The ulnar F-wave response was normal.  The left peroneal and tibial motor responses had normal distal latencies and conduction velocities but markedly reduced amplitudes.  The tibial F-wave latency was mildly prolonged.  Left median and ulnar sensory responses had normal peak latencies and amplitudes.  The left sural and superficial peroneal sensory responses were absent.  The galvanic sympathetic skin response in the left foot was normal.  Electromyography: Needle EMG of selected muscles of the left leg was performed.  She had mixed acute and chronic denervation in the abductor hallucis muscle and the foot and the extensor houses longus muscle at the ankle.  There were a few polyphasic motor units in the vastus medialis and peroneus longus muscle but normal recruitment.  Impression: This NCV/EMG study shows the following: Moderate axonal sensorimotor polyneuropathy.  This appeared to involve large fibers more than small  fibers.  There was mixed acute and chronic denervation in the foot and ankle muscles. No evidence of significant superimposed radiculopathy.  Hannia Matchett A. Felecia Shelling, MD, PhD, FAAN Certified in Neurology, Clinical Neurophysiology, Sleep Medicine, Pain Medicine and Neuroimaging Director, [place] at [facility] [address], [place], [place] [address] [phone]   Verbal informed consent was obtained from the patient, patient was informed of potential risk of procedure, including bruising, bleeding, hematoma formation, infection, muscle weakness, muscle pain, numbness, among others.        [place]    Nerve / Sites Muscle Latency Ref. Amplitude Ref. Rel Amp Segments Distance Velocity Ref. Area    ms ms mV mV %  cm m/s m/s mVms  L Median - APB     Wrist APB 2.9 ?4.4 4.7 ?4.0 100 Wrist - APB 7   19.0     Upper arm APB 7.3  4.5  96.4 Upper arm - Wrist 23 53 ?49 18.6  L Ulnar - ADM     Wrist ADM 2.7 ?3.3 5.8 ?6.0 100 Wrist - ADM 7   31.2     B.Elbow ADM 6.0  5.6  96.9 B.Elbow - Wrist 20 60 ?49 26.4     A.Elbow ADM 7.8  5.5  98.5 A.Elbow - B.Elbow 10 55 ?49 26.1     Median Wrist ADM 9.2  0.2  3.44 Median Wrist - ADM    0.3  L Peroneal - EDB     Ankle EDB 4.0 ?6.5 0.4 ?2.0 100 Ankle - EDB 9   2.4  Full Name: Regis Wiland Gender: Female MRN #: [id number] Date of Birth: [birth year]    Visit Date: 05/27/2021 12:37 Age: 40 Years Examining Physician: Arlice Colt, MD  Referring Physician: Arlice Colt, MD      History: Jeanette Elliott is a 40 year old woman with numbness in the feet greater than the hands and pain in the feet since around April 2022.  Symptoms actually are slightly better now than they were a month ago.  Besides numbness she also had visual changes that have been stable over the last couple months.  Of note, she had severe malnutrition associated with reduced appetite, nausea liver disorder.  On exam, she has mildly reduced vibration sensation at the ankles and moderately reduced vibration sensation at the toes and reduced touch and pinprick sensation in the feet and ankles.  Strength was 4+/5 in the foot.  Nerve conduction studies: The left median and ulnar motor responses had normal distal latencies, amplitudes and conduction velocities.  The ulnar F-wave response was normal.  The left peroneal and tibial motor responses had normal distal latencies and conduction velocities but markedly reduced amplitudes.  The tibial F-wave latency was mildly prolonged.  Left median and ulnar sensory responses had normal peak latencies and amplitudes.  The left sural and superficial peroneal sensory responses were absent.  The galvanic sympathetic skin response in the left foot was normal.  Electromyography: Needle EMG of selected muscles of the left leg was performed.  She had mixed acute and chronic denervation in the abductor hallucis muscle and the foot and the extensor houses longus muscle at the ankle.  There were a few polyphasic motor units in the vastus medialis and peroneus longus muscle but normal recruitment.  Impression: This NCV/EMG study shows the following: Moderate axonal sensorimotor polyneuropathy.  This appeared to involve large fibers more than small  fibers.  There was mixed acute and chronic denervation in the foot and ankle muscles. No evidence of significant superimposed radiculopathy.  Hannia Matchett A. Felecia Shelling, MD, PhD, FAAN Certified in Neurology, Clinical Neurophysiology, Sleep Medicine, Pain Medicine and Neuroimaging Director, [place] at [facility] [address], [place], [place] [address] [phone]   Verbal informed consent was obtained from the patient, patient was informed of potential risk of procedure, including bruising, bleeding, hematoma formation, infection, muscle weakness, muscle pain, numbness, among others.        [place]    Nerve / Sites Muscle Latency Ref. Amplitude Ref. Rel Amp Segments Distance Velocity Ref. Area    ms ms mV mV %  cm m/s m/s mVms  L Median - APB     Wrist APB 2.9 ?4.4 4.7 ?4.0 100 Wrist - APB 7   19.0     Upper arm APB 7.3  4.5  96.4 Upper arm - Wrist 23 53 ?49 18.6  L Ulnar - ADM     Wrist ADM 2.7 ?3.3 5.8 ?6.0 100 Wrist - ADM 7   31.2     B.Elbow ADM 6.0  5.6  96.9 B.Elbow - Wrist 20 60 ?49 26.4     A.Elbow ADM 7.8  5.5  98.5 A.Elbow - B.Elbow 10 55 ?49 26.1     Median Wrist ADM 9.2  0.2  3.44 Median Wrist - ADM    0.3  L Peroneal - EDB     Ankle EDB 4.0 ?6.5 0.4 ?2.0 100 Ankle - EDB 9   2.4  Fib head EDB 10.2  0.2  44.7 Fib head - Ankle 28 45 ?44 1.0     Pop fossa EDB 12.4  0.2  99.2 Pop fossa - Fib head 10 45 ?44 1.3         Pop fossa - Ankle      L Tibial - AH     Ankle AH 3.7 ?5.8 0.7 ?4.0 100 Ankle - AH 9   4.2     Pop fossa AH 13.0  0.3  38.7 Pop fossa - Ankle 38 41 ?41 0.7             SSR    Nerve / Sites Latency   s  L Sympathetic - Foot     Foot 2.69          SNC    Nerve / Sites Rec. Site Peak Lat Ref.  Amp Ref. Segments Distance    ms ms V V  cm  L Sural - Ankle (Calf)     Calf Ankle NR ?4.4 NR ?6 Calf - Ankle 14  L Superficial peroneal - Ankle     Lat leg Ankle NR ?4.4 NR ?6 Lat leg  - Ankle 14  L Median - Orthodromic (Dig II, Mid palm)     Dig II Wrist 3.1 ?3.4 18 ?10 Dig II - Wrist 13  L Ulnar - Orthodromic, (Dig V, Mid palm)     Dig V Wrist 2.7 ?3.1 11 ?5 Dig V - Wrist 8             F  Wave    Nerve F Lat Ref.   ms ms  L Tibial - AH 59.9 ?56.0  L Ulnar - ADM 29.6 ?32.0         EMG Summary Table    Spontaneous MUAP Recruitment  Muscle IA Fib PSW Fasc Other Amp Dur. Poly Pattern  L. Vastus medialis Normal None None None _______ Normal Normal 1+ Normal  L. Peroneus longus Normal None None None _______ Normal Normal 1+ Normal  L. Gastrocnemius (Medial head) Increased None None None _______ Normal Normal Normal Normal  L. Tibialis anterior Normal None None None _______ Normal Normal Normal Normal  L. Abductor hallucis Normal 2+ 2+ None _______ Normal Normal 2+ Discrete  L. Iliopsoas Normal None None None _______ Normal Normal Normal Normal  L. Extensor hallucis longus Normal 1+ 2+ None _______ Normal Increased 2+ Reduced         [facility]  PATIENT: Valeta Paz DOB: [birth year]  REFERRING DOCTOR OR PCP: Arlester Marker MD (PCP); Katy Apo, OD (optometry) SOURCE: Patient, notes from Dr. Jacqualin Combes, lab results and imaging reviewed  _________________________________   HISTORICAL  CHIEF COMPLAINT:  Polyneuropathy   HISTORY OF PRESENT ILLNESS:  Zuha Dejonge is a 40 year old woman with past diagnoses including hepatic steatosis, alcohol use, B12 deficiency and Sjogren's syndrome who had the onset of jaundice earlier this year.  Liver biopsy showed cholestasis, steatosis.  Of note, she did have alcohol use (though not abuse) but stopped May 2022.   She was placed on the transplant list but due to improvements, is now off.  She has had numbness in the feet greater than the hands and pain in the feet since around April 2022.  Symptoms actually are slightly better now than they were a month ago.  Besides numbness she also had visual changes that  have been stable over the last couple months  She was diagnosed with Sjogren's syndrome  Fib head EDB 10.2  0.2  44.7 Fib head - Ankle 28 45 ?44 1.0     Pop fossa EDB 12.4  0.2  99.2 Pop fossa - Fib head 10 45 ?44 1.3         Pop fossa - Ankle      L Tibial - AH     Ankle AH 3.7 ?5.8 0.7 ?4.0 100 Ankle - AH 9   4.2     Pop fossa AH 13.0  0.3  38.7 Pop fossa - Ankle 38 41 ?41 0.7             SSR    Nerve / Sites Latency   s  L Sympathetic - Foot     Foot 2.69          SNC    Nerve / Sites Rec. Site Peak Lat Ref.  Amp Ref. Segments Distance    ms ms V V  cm  L Sural - Ankle (Calf)     Calf Ankle NR ?4.4 NR ?6 Calf - Ankle 14  L Superficial peroneal - Ankle     Lat leg Ankle NR ?4.4 NR ?6 Lat leg  - Ankle 14  L Median - Orthodromic (Dig II, Mid palm)     Dig II Wrist 3.1 ?3.4 18 ?10 Dig II - Wrist 13  L Ulnar - Orthodromic, (Dig V, Mid palm)     Dig V Wrist 2.7 ?3.1 11 ?5 Dig V - Wrist 8             F  Wave    Nerve F Lat Ref.   ms ms  L Tibial - AH 59.9 ?56.0  L Ulnar - ADM 29.6 ?32.0         EMG Summary Table    Spontaneous MUAP Recruitment  Muscle IA Fib PSW Fasc Other Amp Dur. Poly Pattern  L. Vastus medialis Normal None None None _______ Normal Normal 1+ Normal  L. Peroneus longus Normal None None None _______ Normal Normal 1+ Normal  L. Gastrocnemius (Medial head) Increased None None None _______ Normal Normal Normal Normal  L. Tibialis anterior Normal None None None _______ Normal Normal Normal Normal  L. Abductor hallucis Normal 2+ 2+ None _______ Normal Normal 2+ Discrete  L. Iliopsoas Normal None None None _______ Normal Normal Normal Normal  L. Extensor hallucis longus Normal 1+ 2+ None _______ Normal Increased 2+ Reduced         [facility]  PATIENT: Valeta Paz DOB: [birth year]  REFERRING DOCTOR OR PCP: Arlester Marker MD (PCP); Katy Apo, OD (optometry) SOURCE: Patient, notes from Dr. Jacqualin Combes, lab results and imaging reviewed  _________________________________   HISTORICAL  CHIEF COMPLAINT:  Polyneuropathy   HISTORY OF PRESENT ILLNESS:  Zuha Dejonge is a 40 year old woman with past diagnoses including hepatic steatosis, alcohol use, B12 deficiency and Sjogren's syndrome who had the onset of jaundice earlier this year.  Liver biopsy showed cholestasis, steatosis.  Of note, she did have alcohol use (though not abuse) but stopped May 2022.   She was placed on the transplant list but due to improvements, is now off.  She has had numbness in the feet greater than the hands and pain in the feet since around April 2022.  Symptoms actually are slightly better now than they were a month ago.  Besides numbness she also had visual changes that  have been stable over the last couple months  She was diagnosed with Sjogren's syndrome  Fib head EDB 10.2  0.2  44.7 Fib head - Ankle 28 45 ?44 1.0     Pop fossa EDB 12.4  0.2  99.2 Pop fossa - Fib head 10 45 ?44 1.3         Pop fossa - Ankle      L Tibial - AH     Ankle AH 3.7 ?5.8 0.7 ?4.0 100 Ankle - AH 9   4.2     Pop fossa AH 13.0  0.3  38.7 Pop fossa - Ankle 38 41 ?41 0.7             SSR    Nerve / Sites Latency   s  L Sympathetic - Foot     Foot 2.69          SNC    Nerve / Sites Rec. Site Peak Lat Ref.  Amp Ref. Segments Distance    ms ms V V  cm  L Sural - Ankle (Calf)     Calf Ankle NR ?4.4 NR ?6 Calf - Ankle 14  L Superficial peroneal - Ankle     Lat leg Ankle NR ?4.4 NR ?6 Lat leg  - Ankle 14  L Median - Orthodromic (Dig II, Mid palm)     Dig II Wrist 3.1 ?3.4 18 ?10 Dig II - Wrist 13  L Ulnar - Orthodromic, (Dig V, Mid palm)     Dig V Wrist 2.7 ?3.1 11 ?5 Dig V - Wrist 8             F  Wave    Nerve F Lat Ref.   ms ms  L Tibial - AH 59.9 ?56.0  L Ulnar - ADM 29.6 ?32.0         EMG Summary Table    Spontaneous MUAP Recruitment  Muscle IA Fib PSW Fasc Other Amp Dur. Poly Pattern  L. Vastus medialis Normal None None None _______ Normal Normal 1+ Normal  L. Peroneus longus Normal None None None _______ Normal Normal 1+ Normal  L. Gastrocnemius (Medial head) Increased None None None _______ Normal Normal Normal Normal  L. Tibialis anterior Normal None None None _______ Normal Normal Normal Normal  L. Abductor hallucis Normal 2+ 2+ None _______ Normal Normal 2+ Discrete  L. Iliopsoas Normal None None None _______ Normal Normal Normal Normal  L. Extensor hallucis longus Normal 1+ 2+ None _______ Normal Increased 2+ Reduced         [facility]  PATIENT: Valeta Paz DOB: [birth year]  REFERRING DOCTOR OR PCP: Arlester Marker MD (PCP); Katy Apo, OD (optometry) SOURCE: Patient, notes from Dr. Jacqualin Combes, lab results and imaging reviewed  _________________________________   HISTORICAL  CHIEF COMPLAINT:  Polyneuropathy   HISTORY OF PRESENT ILLNESS:  Zuha Dejonge is a 40 year old woman with past diagnoses including hepatic steatosis, alcohol use, B12 deficiency and Sjogren's syndrome who had the onset of jaundice earlier this year.  Liver biopsy showed cholestasis, steatosis.  Of note, she did have alcohol use (though not abuse) but stopped May 2022.   She was placed on the transplant list but due to improvements, is now off.  She has had numbness in the feet greater than the hands and pain in the feet since around April 2022.  Symptoms actually are slightly better now than they were a month ago.  Besides numbness she also had visual changes that  have been stable over the last couple months  She was diagnosed with Sjogren's syndrome

## 2021-05-28 LAB — HEAVY METALS PROFILE, URINE
Arsenic Ur: NOT DETECTED ug/L (ref 0–9)
Creatinine(Crt),U: 0.3 g/L (ref 0.30–3.00)
Lead, Rand Ur: NOT DETECTED ug/L (ref 0–49)
Mercury, Ur: NOT DETECTED ug/L (ref 0–19)

## 2021-06-04 ENCOUNTER — Other Ambulatory Visit: Payer: Self-pay

## 2021-06-04 ENCOUNTER — Ambulatory Visit (INDEPENDENT_AMBULATORY_CARE_PROVIDER_SITE_OTHER): Payer: 59 | Admitting: Behavioral Health

## 2021-06-04 ENCOUNTER — Encounter: Payer: Self-pay | Admitting: Behavioral Health

## 2021-06-04 VITALS — BP 114/74 | HR 91 | Ht 67.0 in | Wt 144.0 lb

## 2021-06-04 DIAGNOSIS — F331 Major depressive disorder, recurrent, moderate: Secondary | ICD-10-CM

## 2021-06-04 DIAGNOSIS — F411 Generalized anxiety disorder: Secondary | ICD-10-CM | POA: Diagnosis not present

## 2021-06-04 MED ORDER — BUPROPION HCL ER (XL) 150 MG PO TB24: ORAL_TABLET | ORAL | 1 refills | Status: DC

## 2021-06-04 MED ORDER — ESCITALOPRAM OXALATE 20 MG PO TABS: 20.0000 mg | ORAL_TABLET | Freq: Every day | ORAL | 1 refills | Status: DC

## 2021-06-04 NOTE — Progress Notes (Addendum)
[facility] MD/PA/NP Initial Note  06/04/2021 1:14 PM Jeanette Elliott  MRN:  [id number]  Chief Complaint:  Chief Complaint   Anxiety; Depression; Establish Care; Medication Refill; Medication Problem     HPI:  40 year old female presents to this office for initial visit and to establish care. She says that she has been depressed most of her life dating back to early childhood. States that she has a lot of medical issues to include liver failure. She was on transplant list but since has been removed due to good liver values. She understands that caution has to be used when prescribing medications and she may be limited. She denies a prior serious issue of ETOH abuse but was indicated on record. Says that she has tried several other SSRI's in the past but has been on Lexapro since may 2022. She says that it does help with depression but not anger or irritability problems. She also says she has problem with feeling tired and fatigued. She has tried Wellbutrin in the past and said, "it made me feel happier, but also made me feel down".  She would like to try another medication that will help with her depression. She does endorse irritability, racing thoughts, and decreased concentration. She reports depression today at 6/10 and anxiety 3/10. She is sleeping 7 hours per night but it is sometime broken. She does take Gabapentin at bedtime. She denies mania, no psychosis. Denies SI/HI.  Previous medication trials:  Venlafaxine Wellbutrin  Paxil   Visit Diagnosis:    ICD-10-CM   1. Major depressive disorder, recurrent episode, moderate (HCC)  F33.1 buPROPion (WELLBUTRIN XL) 150 MG 24 hr tablet    escitalopram (LEXAPRO) 20 MG tablet    2. Generalized anxiety disorder  F41.1 buPROPion (WELLBUTRIN XL) 150 MG 24 hr tablet    escitalopram (LEXAPRO) 20 MG tablet      Past Psychiatric History: anxiety, depression   Past Medical History:  Past Medical History:  Diagnosis Date   Asthma    Elevated  liver enzymes    Jaundice    Sjogren's syndrome ([place])     Past Surgical History:  Procedure Laterality Date   IR PARACENTESIS  02/03/2021   IR THORACENTESIS ASP PLEURAL SPACE W/IMG GUIDE  01/16/2021   ORIF FINGER / THUMB FRACTURE Left    Thumb   SUBMANDIBULAR GLAND EXCISION     TONSILLECTOMY AND ADENOIDECTOMY      Family Psychiatric History: see chart  Family History:  Family History  Problem Relation Age of Onset   Arthritis Mother    Heart disease Father    Cancer Father        Prostate   Arthritis Father    Heart disease Maternal Grandmother    Stroke Maternal Grandmother    Depression Maternal Grandmother    Alzheimer's disease Maternal Grandfather    Heart disease Paternal Grandfather    Heart disease Paternal Uncle    Colon cancer Neg Hx    Esophageal cancer Neg Hx     Social History:  Social History   Socioeconomic History   Marital status: Divorced    Spouse name: Not on file   Number of children: Not on file   Years of education: BA   Highest education level: Not on file  Occupational History   Occupation: Disabled    Comment: Prior Training and development officer  Tobacco Use   Smoking status: Never   Smokeless tobacco: Never  Vaping Use   Vaping Use: Never used  Substance and  Sexual Activity   Alcohol use: Not Currently   Drug use: Not Currently   Sexual activity: Not on file  Other Topics Concern   Not on file  Social History Narrative   Right handed   Diet coke- 2 per day   Social Determinants of Health   Financial Resource Strain: Not on file  Food Insecurity: Not on file  Transportation Needs: Not on file  Physical Activity: Not on file  Stress: Not on file  Social Connections: Not on file    Allergies:  Allergies  Allergen Reactions   Latex Itching and Rash    Metabolic Disorder Labs: No results found for: HGBA1C, MPG No results found for: PROLACTIN No results found for: CHOL, TRIG, HDL, CHOLHDL, VLDL, LDLCALC Lab Results   Component Value Date   TSH 2.02 05/05/2021   TSH 3.25 04/02/2021    Therapeutic Level Labs: No results found for: LITHIUM No results found for: VALPROATE No components found for:  CBMZ  Current Medications: Current Outpatient Medications  Medication Sig Dispense Refill   albuterol (VENTOLIN HFA) 108 (90 Base) MCG/ACT inhaler Inhale 2 puffs into the lungs every 6 (six) hours as needed (asthma attacks). 8 g 1   buPROPion (WELLBUTRIN XL) 150 MG 24 hr tablet Take one tablet in the morning every other day. 30 tablet 1   Cyanocobalamin 1000 MCG/ML KIT Inject 1 mL as directed every 30 (thirty) days. Dispense with  1 cc syringe and 27 g 1 inch (or similar) needle 3 kit 4   EPINEPHrine 0.3 mg/0.3 mL IJ SOAJ injection Inject 0.3 mg into the muscle once as needed for anaphylaxis. 1 each 0   folic acid (FOLVITE) 1 MG tablet TAKE 1 TABLET(1 MG) BY MOUTH DAILY 90 tablet 1   furosemide (LASIX) 20 MG tablet Take 20 mg by mouth daily.     gabapentin (NEURONTIN) 300 MG capsule Take 1 capsule (300 mg total) by mouth 3 (three) times daily. 90 capsule 11   hydrOXYzine (ATARAX/VISTARIL) 25 MG tablet Take 2 tablets (50 mg total) by mouth 2 (two) times daily as needed. 120 tablet 6   MAGNESIUM PO Take 1 tablet by mouth daily.     midodrine (PROAMATINE) 5 MG tablet Take by mouth.     Naphazoline HCl (CLEAR EYES OP) Place 1 drop into both eyes 2 (two) times daily.     pantoprazole (PROTONIX) 40 MG tablet Take 1 tablet (40 mg total) by mouth 2 (two) times daily. Protonix ( pantoprazole ) 40 mg twice daily for 6 weeks, then reduce to 40 mg daily and slowly titrate off. 60 tablet 0   spironolactone (ALDACTONE) 50 MG tablet TAKE 1 TABLET(50 MG) BY MOUTH EVERY MORNING 90 tablet 0   VITAMIN A PO Take 1 tablet by mouth daily.     escitalopram (LEXAPRO) 20 MG tablet Take 1 tablet (20 mg total) by mouth daily. 90 tablet 1   potassium chloride SA (KLOR-CON) 20 MEQ tablet Take 1 tablet (20 mEq total) by mouth daily. 30  tablet 0   ZOLMitriptan (ZOMIG) 2.5 MG tablet Take 1 tablet (2.5 mg total) by mouth once for 1 dose. May repeat in 2 hours if headache persists or recurs. 20 tablet 3   No current facility-administered medications for this visit.    Medication Side Effects: none  Orders placed this visit:  No orders of the defined types were placed in this encounter.   Psychiatric Specialty Exam:  Review of Systems  HENT:  Positive for  tinnitus.   Eyes:  Positive for visual disturbance.  Cardiovascular:  Positive for leg swelling.  Musculoskeletal:  Positive for back pain, joint swelling, myalgias and neck pain.  Allergic/Immunologic: Positive for environmental allergies.  Neurological:  Positive for speech difficulty, weakness and headaches.  Hematological:  Bruises/bleeds easily.   There were no vitals taken for this visit.There is no height or weight on file to calculate BMI.  General Appearance:  Neat, clean, well groomed  Eye Contact:  Good  Speech:  Clear and Coherent  Volume:  Normal  Mood:  Anxious  Affect:  Appropriate  Thought Process:   normal  Orientation:  Full (Time, Place, and Person)  Thought Content: Logical   Suicidal Thoughts:  No  Homicidal Thoughts:  No  Memory:  WNL  Judgement:  Good  Insight:  Good  Psychomotor Activity:  Normal  Concentration:  Concentration: Good  Recall:  Good  Fund of Knowledge: Good  Language: Good  Assets:  Desire for Improvement  ADL's:  Intact  Cognition: WNL  Prognosis:  Good   Screenings:  CAGE-AID    Flowsheet Row ED to Hosp-Admission (Discharged) from 01/11/2021 in [facility] Score 0      GAD-7    [place] Office Visit from 04/02/2021 in [facility]  Total GAD-7 Score 14      PHQ2-9    [place] Visit from 04/02/2021 in [facility]  PHQ-2 Total Score 4  PHQ-9 Total Score 19      Flowsheet Row ED to  Hosp-Admission (Discharged) from 02/24/2021 in [facility] ED from 02/20/2021 in [facility] ED to Hosp-Admission (Discharged) from 02/02/2021 in [place] [facility] [place] No Risk No Risk No Risk       Receiving Psychotherapy: Yes [name]  Treatment Plan/Recommendations:  Greater than 50% of 45 min face to face time with patient was spent on counseling and coordination of care. We discussed her long history of anxiety and depression. Discussed her limitations having liver dysfunction. She does go for regular labs. I did review her most current labs. Recommended Wellbutrin be taken every other day. Discussed  various AD and why Wellbutrin may assist with concentration and fatigue.  To continue Lexapro 20 mg daily To start Wellbutrin 150 mg XL every other day To report side effects or worsening symptoms Will follow up in 6 weeks to reevaluate Provided emergency contact info Reviewed [place], NP

## 2021-06-08 ENCOUNTER — Encounter: Payer: Self-pay | Admitting: Family Medicine

## 2021-06-08 ENCOUNTER — Ambulatory Visit (INDEPENDENT_AMBULATORY_CARE_PROVIDER_SITE_OTHER): Payer: 59

## 2021-06-08 ENCOUNTER — Ambulatory Visit (INDEPENDENT_AMBULATORY_CARE_PROVIDER_SITE_OTHER): Payer: 59 | Admitting: Family Medicine

## 2021-06-08 ENCOUNTER — Other Ambulatory Visit: Payer: Self-pay

## 2021-06-08 VITALS — BP 98/66 | HR 95 | Temp 98.0°F | Ht 67.0 in | Wt 149.0 lb

## 2021-06-08 DIAGNOSIS — K7460 Unspecified cirrhosis of liver: Secondary | ICD-10-CM | POA: Diagnosis not present

## 2021-06-08 DIAGNOSIS — D649 Anemia, unspecified: Secondary | ICD-10-CM

## 2021-06-08 DIAGNOSIS — M25552 Pain in left hip: Secondary | ICD-10-CM | POA: Diagnosis not present

## 2021-06-08 DIAGNOSIS — Z23 Encounter for immunization: Secondary | ICD-10-CM | POA: Diagnosis not present

## 2021-06-08 DIAGNOSIS — M25551 Pain in right hip: Secondary | ICD-10-CM

## 2021-06-08 DIAGNOSIS — F1021 Alcohol dependence, in remission: Secondary | ICD-10-CM

## 2021-06-08 DIAGNOSIS — G629 Polyneuropathy, unspecified: Secondary | ICD-10-CM | POA: Diagnosis not present

## 2021-06-08 DIAGNOSIS — R188 Other ascites: Secondary | ICD-10-CM

## 2021-06-08 NOTE — Progress Notes (Signed)
[facility] [address] [place] [address] Dept: [phone] Dept Fax: [phone]  Office Visit  Subjective:    Patient ID: Jeanette Elliott, female    DOB: 1981-06-30, 40 y.o..   MRN: [id number]  Chief Complaint  Patient presents with   Follow-up    4 wk f/u    History of Present Illness:  Patient is in today for reassessment of her chronic medical issues.  Ms. Brazier has a history of alcohol dependence. She remains in remission at this point, maintaining her sobriety. Her alcoholism had been complicated by cirrhosis. However, she has had marked improvement in her hepatic and hematologic abnormalities.  Ms. Sermeno is seeing neurology related to a peripheral neuropathy. She was also seen by ophthalmology related to visual difficulties. Reportedly, the ophthalmologist did not appreciate any specific eye issue, but reported some minor findings. Ms. Brodt notes the ophthalmologist has requested the neurologist consider an MRI scan to assess.  Ms. Gulden complains of bilateral hip pain for some years. She has episodes where her hips pop, L>R. At other times, she feels a pressure sensation in the hip. She denies any known trauma or congential issues.  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 34/74/2595   Alcohol abuse 03/04/2021   Constipation    Abdominal distention    Anxiety    Cirrhosis ([place]) 02/02/2021   Pleural effusion    Malnutrition ([place]) 01/16/2021   S/P thoracentesis    Closed fracture of rib of left side with routine healing    Fall    Dysplasia of cervix, low grade (CIN 1) 09/22/2016   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   Sjogren's syndrome ([place]) 07/20/2016    Major depressive disorder, recurrent episode ([place]) 05/16/2013   Past Surgical History:  Procedure Laterality Date   IR PARACENTESIS  02/03/2021   IR THORACENTESIS ASP PLEURAL SPACE W/IMG GUIDE  01/16/2021   ORIF FINGER / THUMB FRACTURE Left    Thumb   SUBMANDIBULAR GLAND EXCISION     TONSILLECTOMY AND ADENOIDECTOMY     Family History  Problem Relation Age of Onset   Arthritis Mother    Heart disease Father    Cancer Father        Prostate   Arthritis Father    Heart disease Maternal Grandmother    Stroke Maternal Grandmother    Depression Maternal Grandmother    Alzheimer's disease Maternal Grandfather    Heart disease Paternal Grandfather    Heart disease Paternal Uncle    Colon cancer Neg Hx    Esophageal cancer Neg Hx    Outpatient Medications Prior to Visit  Medication Sig Dispense Refill   albuterol (VENTOLIN HFA) 108 (90 Base) MCG/ACT inhaler Inhale 2 puffs into the lungs every 6 (six) hours as needed (asthma attacks). 8 g 1   B-D 3CC LUER-LOK SYR 25GX1" 25G X 1" 3 ML MISC SMARTSIG:Injection     buPROPion (WELLBUTRIN XL) 150 MG 24 hr tablet Take one tablet in the morning every other day. 30 tablet 1   Cyanocobalamin 1000 MCG/ML KIT Inject 1 mL as directed every 30 (thirty) days. Dispense with  1 cc syringe and 27 g 1 inch (or similar) needle 3 kit 4   EPINEPHrine 0.3 mg/0.3  mL IJ SOAJ injection Inject 0.3 mg into the muscle once as needed for anaphylaxis. 1 each 0   escitalopram (LEXAPRO) 20 MG tablet Take 1 tablet (20 mg total) by mouth daily. 90 tablet 1   folic acid (FOLVITE) 1 MG tablet TAKE 1 TABLET(1 MG) BY MOUTH DAILY 90 tablet 1   furosemide (LASIX) 20 MG tablet Take 20 mg by mouth daily.     gabapentin (NEURONTIN) 300 MG capsule Take 1 capsule (300 mg total) by mouth 3 (three) times daily. 90 capsule 11   hydrOXYzine (ATARAX/VISTARIL) 25 MG tablet Take 2 tablets (50 mg total) by mouth 2 (two) times daily as needed. 120 tablet 6   MAGNESIUM PO Take 1 tablet by  mouth daily.     midodrine (PROAMATINE) 5 MG tablet Take by mouth.     Naphazoline HCl (CLEAR EYES OP) Place 1 drop into both eyes 2 (two) times daily.     pantoprazole (PROTONIX) 40 MG tablet Take 1 tablet (40 mg total) by mouth 2 (two) times daily. Protonix ( pantoprazole ) 40 mg twice daily for 6 weeks, then reduce to 40 mg daily and slowly titrate off. 60 tablet 0   potassium chloride SA (KLOR-CON) 20 MEQ tablet Take 1 tablet (20 mEq total) by mouth daily. 30 tablet 0   spironolactone (ALDACTONE) 50 MG tablet TAKE 1 TABLET(50 MG) BY MOUTH EVERY MORNING 90 tablet 0   sucralfate (CARAFATE) 1 g tablet SMARTSIG:1 Tablespoon By Mouth Twice Daily     VITAMIN A PO Take 1 tablet by mouth daily.     Vitamin D, Ergocalciferol, (DRISDOL) 1.25 MG (50000 UNIT) CAPS capsule Take 50,000 Units by mouth once a week.     ZOLMitriptan (ZOMIG) 2.5 MG tablet Take 1 tablet (2.5 mg total) by mouth once for 1 dose. May repeat in 2 hours if headache persists or recurs. 20 tablet 3   No facility-administered medications prior to visit.   Allergies  Allergen Reactions   Latex Itching and Rash    Objective:   Today's Vitals   06/08/21 1032  BP: 98/66  Pulse: 95  Temp: 98 F (36.7 C)  TempSrc: Temporal  SpO2: 99%  Weight: 149 lb (67.6 kg)  Height: _0  (1.702 m)   Body mass index is 23.34 kg/m.   General: Well developed, well nourished. No acute distress. Extremities: Patient notes anterior hip pain. Audible pop was noted   with patient's movement.   FROM. No increased pain with FABER test. Psych: Alert and oriented. Normal mood and affect.  Health Maintenance Due  Topic Date Due   TETANUS/TDAP  Never done   PAP SMEAR-Modifier  Never done   INFLUENZA VACCINE  Never done    Lab results: Lab Results  Component Value Date   WBC 8.0 05/05/2021   HGB 11.6 (L) 05/05/2021   HCT 35.2 (L) 05/05/2021   MCV 92.5 05/05/2021   PLT 329.0 05/05/2021   CMP Latest Ref Rng & Units 05/20/2021 05/05/2021  03/04/2021  Glucose 70 - 99 mg/dL - 82 80  BUN 6 - 23 mg/dL - 18 6  Creatinine 0.40 - 1.20 mg/dL - 0.72 0.63  Sodium 135 - 145 mEq/L - 136 134(L)  Potassium 3.5 - 5.1 mEq/L - 4.0 4.3  Chloride 96 - 112 mEq/L - 99 98  CO2 19 - 32 mEq/L - 22 24  Calcium 8.4 - 10.5 mg/dL - 10.5 9.1  Total Protein 6.0 - 8.5 g/dL 7.1 7.7 6.1  Total Bilirubin 0.2 -  1.2 mg/dL - 1.0 4.4(H)  Alkaline Phos 39 - 117 U/L - 114 259(H)  AST 0 - 37 U/L - 35 44(H)  ALT 0 - 35 U/L - 19 20    Assessment & Plan:   1. Bilateral hip pain Ms. Engram's hip pain may represent a labral tear. I will refer her to Sports Medicine. for evaluation. She may take Tylenol to help with   - DG Hip Unilat W OR W/O Pelvis 2-3 Views Right - DG Hip Unilat W OR W/O Pelvis 2-3 Views Left - Ambulatory referral to Sports Medicine  2. Need for influenza vaccination  - Flu Vaccine QUAD 6+ mos PF IM (Fluarix Quad PF)  3. Cirrhosis of liver with ascites, unspecified hepatic cirrhosis type [facility] Reviewed notes from liver specialist. We will continue furosemide and spironolactone. They did decrease her mitodrine, but I would hold at her current level.  4. Neuropathy Continue to follow with neurology.  5. Normocytic anemia Mild normocytic anemia. Continue B12 injections.  6. Alcohol dependence in remission ([place]) Continued sobriety. Encouraged Ms. Rideout to continue to engage in counseling.  Haydee Salter, MD

## 2021-06-10 ENCOUNTER — Other Ambulatory Visit: Payer: Self-pay | Admitting: Neurology

## 2021-06-10 DIAGNOSIS — H543 Unqualified visual loss, both eyes: Secondary | ICD-10-CM

## 2021-06-10 DIAGNOSIS — R200 Anesthesia of skin: Secondary | ICD-10-CM

## 2021-06-11 ENCOUNTER — Other Ambulatory Visit: Payer: Self-pay

## 2021-06-11 ENCOUNTER — Ambulatory Visit (INDEPENDENT_AMBULATORY_CARE_PROVIDER_SITE_OTHER): Payer: 59 | Admitting: Psychiatry

## 2021-06-11 DIAGNOSIS — F411 Generalized anxiety disorder: Secondary | ICD-10-CM
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
## 2021-06-16 ENCOUNTER — Telehealth: Payer: Self-pay | Admitting: Family Medicine

## 2021-06-16 ENCOUNTER — Telehealth: Payer: Self-pay | Admitting: Neurology

## 2021-06-16 ENCOUNTER — Telehealth: Payer: Self-pay

## 2021-06-16 NOTE — Telephone Encounter (Signed)
We received a call from this patient, she states that she was returning a call regarding scheduling her MRI. I advised she would be called back regarding this

## 2021-06-16 NOTE — Telephone Encounter (Signed)
[facility] pending faxed notes

## 2021-06-17 ENCOUNTER — Ambulatory Visit (INDEPENDENT_AMBULATORY_CARE_PROVIDER_SITE_OTHER): Payer: 59 | Admitting: Psychiatry

## 2021-06-17 ENCOUNTER — Other Ambulatory Visit: Payer: Self-pay

## 2021-06-17 DIAGNOSIS — F411 Generalized anxiety disorder: Secondary | ICD-10-CM

## 2021-06-17 MED ORDER — ALPRAZOLAM 0.5 MG PO TABS: ORAL_TABLET | ORAL | 0 refills | Status: DC

## 2021-06-17 NOTE — Addendum Note (Signed)
Addended by: Britt Bottom on: 06/17/2021 03:17 PM   Modules accepted: Orders

## 2021-06-17 NOTE — Telephone Encounter (Signed)
MR Brain w/wo contrast & MR Orbits w/wo contrast Dr. Felecia Shelling [employer] auth: [id number] (exp. 06/16/21 to 09/16/21). Patient is scheduled at [facility] for 06/23/21.  She did inform me she is claustrophobic and would like something to help her. She is aware to have a driver.

## 2021-06-17 NOTE — Telephone Encounter (Signed)
Patient notified VIA phone that referral was placed for Sports medicine in [place] (looks as if they tried to call and lft a VM).  She will li=ok up the number and call them .  Advised to call back if she needs anything else. Dm/cma

## 2021-06-17 NOTE — Progress Notes (Signed)
      [facility] Counselor/Therapist Progress Note  Patient ID: Jeanette Elliott, MRN: [id number],    Date: 06/17/2021  Time Spent: 52 minutes start time 11:08 AM end time 12 PM  Treatment Type: Individual Therapy  Reported Symptoms: anxiety, sadness, sleep issues, pain, irritability, vision issues, nightmares, intrusive thoughts, weight loss due to not wanting to weight  Mental Status Exam:  Appearance:   Casual     Behavior:  Appropriate  Motor:  Normal  Speech/Language:   Normal Rate  Affect:  Appropriate  Mood:  anxious  Thought process:  normal  Thought content:    WNL  Sensory/Perceptual disturbances:    WNL  Orientation:  oriented to person, place, time/date, and situation  Attention:  Good  Concentration:  Good  Memory:  WNL  Fund of knowledge:   Good  Insight:    Good  Judgment:   Good  Impulse Control:  Good   Risk Assessment: Danger to Self:  No she is still having intrusive thoughts of self harm but contracts for safety  Self-injurious Behavior: No Danger to Others: No Duty to Warn:no Physical Aggression / Violence:No  Access to Firearms a concern: No  Gang Involvement:No   Subjective: Patient was present for session.  She shared that it was a bumpy morning with she and her mother due to conflicts they are having.  She went on to share she is still not doing great with her vision issues not getting better and making things difficult.  She went on to share she is wanting to get back to work and not being dependent on her parents.  She is not sure if she can do it or not.  Discussed situation and encouraged patient to do what she feels is best for her.  She also shared that she feels the Wellbutrin is continuing to make her extremes in sadness bigger.  Discussed different ways that she can manage those emotions and the importance of releasing them in healthy ways.  Also discussed the importance of interacting with others.  Interventions: Solution-Oriented/Positive  Psychology and Insight-Oriented  Diagnosis:   ICD-10-CM   1. Generalized anxiety disorder  F41.1       Plan: Patient is to use CBT and coping skills to decrease anxiety symptoms.  Patient is to try and have contact with others.  Patient is to work on self care through diet and working with providers on medical issues.  Patient is to take medication as directed. Long term goal: Resolve the core conflict that is the source of anxiety Short term goal: Identify the major life conflicts from the past and present that form the basis for present anxiety   [name], [facility]

## 2021-06-17 NOTE — Telephone Encounter (Signed)
MR Brain w/wo contrast & MR Orbits w/wo contrast Dr. Felecia Shelling [employer] auth: [id number] (exp. 06/16/21 to 09/16/21). Patient is scheduled at [facility] for 06/23/21

## 2021-06-17 NOTE — Addendum Note (Signed)
Addended by: Wyvonnia Lora on: 06/17/2021 02:51 PM   Modules accepted: Orders

## 2021-06-17 NOTE — Telephone Encounter (Signed)
Lft VM to rtn call. Dm/cma  

## 2021-06-18 ENCOUNTER — Ambulatory Visit (INDEPENDENT_AMBULATORY_CARE_PROVIDER_SITE_OTHER): Payer: 59 | Admitting: Family Medicine

## 2021-06-18 ENCOUNTER — Encounter: Payer: Self-pay | Admitting: Family Medicine

## 2021-06-18 VITALS — Ht 67.0 in | Wt 140.0 lb

## 2021-06-18 DIAGNOSIS — M533 Sacrococcygeal disorders, not elsewhere classified: Secondary | ICD-10-CM

## 2021-06-18 DIAGNOSIS — M25852 Other specified joint disorders, left hip: Secondary | ICD-10-CM | POA: Diagnosis not present

## 2021-06-18 MED ORDER — DICLOFENAC SODIUM 2 % EX SOLN: 1.0000 "application " | Freq: Two times a day (BID) | CUTANEOUS | 2 refills | Status: DC

## 2021-06-18 NOTE — Patient Instructions (Addendum)
Nice to meet you Please try the exercises  Please let me know about physical therapy  Please send me a message in MyChart with any questions or updates.  Please see me back in 4 weeks.   --Dr. Raeford Razor

## 2021-06-18 NOTE — Progress Notes (Signed)
  Jeanette Elliott - 40 y.o. female MRN [id number]  Date of birth: [birth year]  SUBJECTIVE:  Including CC & ROS.  No chief complaint on file.   Jeanette Elliott is a 40 y.o. female that is presenting with bilateral hip pain.  The pain has been ongoing for the past few months.  She has been admitted about 6 different occasions.  Has limited medication she can take due to her improving liver.   Review of Systems See HPI   HISTORY: Past Medical, Surgical, Social, and Family History Reviewed & Updated per EMR.   Pertinent Historical Findings include:  Past Medical History:  Diagnosis Date   Asthma    Elevated liver enzymes    Jaundice    Sjogren's syndrome ([place])     Past Surgical History:  Procedure Laterality Date   IR PARACENTESIS  02/03/2021   IR THORACENTESIS ASP PLEURAL SPACE W/IMG GUIDE  01/16/2021   ORIF FINGER / THUMB FRACTURE Left    Thumb   SUBMANDIBULAR GLAND EXCISION     TONSILLECTOMY AND ADENOIDECTOMY      Family History  Problem Relation Age of Onset   Arthritis Mother    Heart disease Father    Cancer Father        Prostate   Arthritis Father    Heart disease Maternal Grandmother    Stroke Maternal Grandmother    Depression Maternal Grandmother    Alzheimer's disease Maternal Grandfather    Heart disease Paternal Grandfather    Heart disease Paternal Uncle    Colon cancer Neg Hx    Esophageal cancer Neg Hx     Social History   Socioeconomic History   Marital status: Divorced    Spouse name: Not on file   Number of children: Not on file   Years of education: BA   Highest education level: Not on file  Occupational History   Occupation: Disabled    Comment: Prior Training and development officer  Tobacco Use   Smoking status: Never   Smokeless tobacco: Never  Vaping Use   Vaping Use: Never used  Substance and Sexual Activity   Alcohol use: Not Currently   Drug use: Not Currently   Sexual activity: Not on file  Other Topics Concern   Not on file  Social  History Narrative   Right handed   Diet coke- 2 per day   Social Determinants of Health   Financial Resource Strain: Not on file  Food Insecurity: Not on file  Transportation Needs: Not on file  Physical Activity: Not on file  Stress: Not on file  Social Connections: Not on file  Intimate Partner Violence: Not on file     PHYSICAL EXAM:  VS: Ht 5' 7"  (1.702 m)   Wt 140 lb (63.5 kg)   BMI 21.93 kg/m  Physical Exam Gen: NAD, alert, cooperative with exam, well-appearing      ASSESSMENT & PLAN:   Sacroiliac joint dysfunction of right side Tenderness occurring over the left SI joint.  Contributing to her lower back pain. -Counseled on home exercise therapy and supportive care. -Pennsaid. -Could consider physical therapy or imaging or injection.  Hip impingement syndrome, left Instability appreciated on exam notes leading to the pain.  She is deconditioned from repeated hospital admissions. -Counseled on home exercise therapy and supportive care. -Counseled on nutrition. -Could consider physical therapy

## 2021-06-18 NOTE — Assessment & Plan Note (Signed)
Instability appreciated on exam notes leading to the pain.  She is deconditioned from repeated hospital admissions. -Counseled on home exercise therapy and supportive care. -Counseled on nutrition. -Could consider physical therapy

## 2021-06-18 NOTE — Assessment & Plan Note (Signed)
Tenderness occurring over the left SI joint.  Contributing to her lower back pain. -Counseled on home exercise therapy and supportive care. -Pennsaid. -Could consider physical therapy or imaging or injection.

## 2021-06-23 ENCOUNTER — Ambulatory Visit (INDEPENDENT_AMBULATORY_CARE_PROVIDER_SITE_OTHER): Payer: 59

## 2021-06-23 ENCOUNTER — Other Ambulatory Visit: Payer: Self-pay

## 2021-06-23 DIAGNOSIS — H543 Unqualified visual loss, both eyes: Secondary | ICD-10-CM

## 2021-06-23 DIAGNOSIS — R200 Anesthesia of skin: Secondary | ICD-10-CM

## 2021-06-23 MED ORDER — GADOBENATE DIMEGLUMINE 529 MG/ML IV SOLN
15.0000 mL | Freq: Once | INTRAVENOUS | Status: AC | PRN
  Administered 2021-06-23: 15 mL via INTRAVENOUS

## 2021-06-30 ENCOUNTER — Other Ambulatory Visit: Payer: Self-pay

## 2021-06-30 ENCOUNTER — Ambulatory Visit (INDEPENDENT_AMBULATORY_CARE_PROVIDER_SITE_OTHER): Payer: 59 | Admitting: Psychiatry

## 2021-06-30 DIAGNOSIS — F411 Generalized anxiety disorder: Secondary | ICD-10-CM
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
## 2021-07-14 ENCOUNTER — Other Ambulatory Visit: Payer: Self-pay

## 2021-07-14 ENCOUNTER — Ambulatory Visit (INDEPENDENT_AMBULATORY_CARE_PROVIDER_SITE_OTHER): Payer: 59 | Admitting: Psychiatry

## 2021-07-14 DIAGNOSIS — F411 Generalized anxiety disorder: Secondary | ICD-10-CM | POA: Diagnosis not present
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
## 2021-07-16 ENCOUNTER — Other Ambulatory Visit: Payer: Self-pay

## 2021-07-16 ENCOUNTER — Encounter: Payer: Self-pay | Admitting: Behavioral Health

## 2021-07-16 ENCOUNTER — Ambulatory Visit: Payer: 59 | Admitting: Family Medicine

## 2021-07-16 ENCOUNTER — Ambulatory Visit (INDEPENDENT_AMBULATORY_CARE_PROVIDER_SITE_OTHER): Payer: 59 | Admitting: Behavioral Health

## 2021-07-16 DIAGNOSIS — F331 Major depressive disorder, recurrent, moderate: Secondary | ICD-10-CM

## 2021-07-16 DIAGNOSIS — F411 Generalized anxiety disorder: Secondary | ICD-10-CM

## 2021-07-16 MED ORDER — TRAZODONE HCL 50 MG PO TABS: 50.0000 mg | ORAL_TABLET | Freq: Every day | ORAL | 1 refills | Status: DC

## 2021-07-16 MED ORDER — BUPROPION HCL ER (XL) 300 MG PO TB24: 300.0000 mg | ORAL_TABLET | Freq: Every day | ORAL | 2 refills | Status: DC

## 2021-07-16 MED ORDER — ESCITALOPRAM OXALATE 20 MG PO TABS: 20.0000 mg | ORAL_TABLET | Freq: Every day | ORAL | 2 refills | Status: DC

## 2021-07-16 NOTE — Progress Notes (Signed)
[facility]  Patient ID: Jeanette Elliott,  MRN: [id number]  PCP: Haydee Salter, MD  Date of Evaluation: 07/16/2021 Time spent:20 minutes  Chief Complaint:  Chief Complaint   Anxiety; Depression; Follow-up; Medication Refill     HISTORY/CURRENT STATUS: HPI 40 year old female presents to this office for follow up and medication management. She says that she feel like she has been more emotional since adding the Wellbutrin 150 mg last visit. She says that her labs have been normal and feels like dosage increase may be needed. She is working regularly at [employer]. She reports anxiety at 2/10 and depression at 6/10. She says she averages about 4 hours sleep per night. She would like to consider a safe effective medication to help with quality of sleep. Denies mania, no psychosis. No SI/HI.    Previous medication trials:   Venlafaxine Wellbutrin  Paxil    Individual Medical History/ Review of Systems: Changes? :No   Allergies: Latex  Current Medications:  Current Outpatient Medications:    buPROPion (WELLBUTRIN XL) 300 MG 24 hr tablet, Take 1 tablet (300 mg total) by mouth daily., Disp: 30 tablet, Rfl: 2   traZODone (DESYREL) 50 MG tablet, Take 1 tablet (50 mg total) by mouth at bedtime., Disp: 30 tablet, Rfl: 1   albuterol (VENTOLIN HFA) 108 (90 Base) MCG/ACT inhaler, Inhale 2 puffs into the lungs every 6 (six) hours as needed (asthma attacks)., Disp: 8 g, Rfl: 1   ALPRAZolam (XANAX) 0.5 MG tablet, Take 1-2 tablets 30-60 min prior to MRI. Can take additional tablet at time of test if needed. Must have driver to and from test, can cause drowsiness., Disp: 3 tablet, Rfl: 0   B-D 3CC LUER-LOK SYR 25GX1" 25G X 1" 3 ML MISC, SMARTSIG:Injection, Disp: , Rfl:    buPROPion (WELLBUTRIN XL) 150 MG 24 hr tablet, Take one tablet in the morning every other day., Disp: 30 tablet, Rfl: 1   Cyanocobalamin 1000 MCG/ML KIT, Inject 1 mL as directed every 30 (thirty) days. Dispense  with  1 cc syringe and 27 g 1 inch (or similar) needle, Disp: 3 kit, Rfl: 4   Diclofenac Sodium (PENNSAID) 2 % SOLN, Place 1 application onto the skin 2 (two) times daily., Disp: 112 g, Rfl: 2   EPINEPHrine 0.3 mg/0.3 mL IJ SOAJ injection, Inject 0.3 mg into the muscle once as needed for anaphylaxis., Disp: 1 each, Rfl: 0   escitalopram (LEXAPRO) 20 MG tablet, Take 1 tablet (20 mg total) by mouth daily., Disp: 90 tablet, Rfl: 2   folic acid (FOLVITE) 1 MG tablet, TAKE 1 TABLET(1 MG) BY MOUTH DAILY, Disp: 90 tablet, Rfl: 1   furosemide (LASIX) 20 MG tablet, Take 20 mg by mouth daily., Disp: , Rfl:    gabapentin (NEURONTIN) 300 MG capsule, Take 1 capsule (300 mg total) by mouth 3 (three) times daily., Disp: 90 capsule, Rfl: 11   hydrOXYzine (ATARAX/VISTARIL) 25 MG tablet, Take 2 tablets (50 mg total) by mouth 2 (two) times daily as needed., Disp: 120 tablet, Rfl: 6   MAGNESIUM PO, Take 1 tablet by mouth daily., Disp: , Rfl:    midodrine (PROAMATINE) 5 MG tablet, Take by mouth., Disp: , Rfl:    Naphazoline HCl (CLEAR EYES OP), Place 1 drop into both eyes 2 (two) times daily., Disp: , Rfl:    pantoprazole (PROTONIX) 40 MG tablet, Take 1 tablet (40 mg total) by mouth 2 (two) times daily. Protonix ( pantoprazole ) 40 mg twice daily  for 6 weeks, then reduce to 40 mg daily and slowly titrate off., Disp: 60 tablet, Rfl: 0   potassium chloride SA (KLOR-CON) 20 MEQ tablet, Take 1 tablet (20 mEq total) by mouth daily., Disp: 30 tablet, Rfl: 0   spironolactone (ALDACTONE) 50 MG tablet, TAKE 1 TABLET(50 MG) BY MOUTH EVERY MORNING, Disp: 90 tablet, Rfl: 0   sucralfate (CARAFATE) 1 g tablet, SMARTSIG:1 Tablespoon By Mouth Twice Daily, Disp: , Rfl:    VITAMIN A PO, Take 1 tablet by mouth daily., Disp: , Rfl:    Vitamin D, Ergocalciferol, (DRISDOL) 1.25 MG (50000 UNIT) CAPS capsule, Take 50,000 Units by mouth once a week., Disp: , Rfl:    ZOLMitriptan (ZOMIG) 2.5 MG tablet, Take 1 tablet (2.5 mg total) by mouth once  for 1 dose. May repeat in 2 hours if headache persists or recurs., Disp: 20 tablet, Rfl: 3 Medication Side Effects: none  Family Medical/ Social History: Changes? No  MENTAL HEALTH EXAM:  There were no vitals taken for this visit.There is no height or weight on file to calculate BMI.  General Appearance: Casual and Neat  Eye Contact:  Good  Speech:  Clear and Coherent  Volume:  Decreased  Mood:  Depressed  Affect:  Depressed  Thought Process:  Coherent  Orientation:  Full (Time, Place, and Person)  Thought Content: Logical   Suicidal Thoughts:  No  Homicidal Thoughts:  No  Memory:  WNL  Judgement:  Good  Insight:  Good  Psychomotor Activity:  Normal  Concentration:  Concentration: Good  Recall:  Good  Fund of Knowledge: Good  Language: Good  Assets:  Desire for Improvement  ADL's:  Intact  Cognition: WNL  Prognosis:  Good    DIAGNOSES:    ICD-10-CM   1. Generalized anxiety disorder  F41.1 buPROPion (WELLBUTRIN XL) 300 MG 24 hr tablet    traZODone (DESYREL) 50 MG tablet    escitalopram (LEXAPRO) 20 MG tablet    2. Major depressive disorder, recurrent episode, moderate (HCC)  F33.1 buPROPion (WELLBUTRIN XL) 300 MG 24 hr tablet    traZODone (DESYREL) 50 MG tablet    escitalopram (LEXAPRO) 20 MG tablet      Receiving Psychotherapy: Yes    RECOMMENDATIONS:   Greater than 50% of 20  min face to face time with patient was spent on counseling and coordination of care. We discussed minimal improvements with depression. She believes she has been more emotional since taking Wellbutrin. She has been taking 150 mg every other day due to liver concerns. However her most recent labs in 05/2021 are normal.  Discussed various medication options and will review again next visit.  To continue Lexapro 20 mg daily To increase Wellbutrin  to 300 mg XL daily after taking 150 mg daily for one week.  To report side effects or worsening symptoms Will follow up in 4 weeks to  reevaluate Educated on good sleep hygiene Provided emergency contact info Reviewed [place], NP

## 2021-07-27 ENCOUNTER — Other Ambulatory Visit: Payer: Self-pay

## 2021-07-27 ENCOUNTER — Ambulatory Visit (INDEPENDENT_AMBULATORY_CARE_PROVIDER_SITE_OTHER): Payer: 59 | Admitting: Psychiatry

## 2021-07-27 DIAGNOSIS — F411 Generalized anxiety disorder: Secondary | ICD-10-CM | POA: Diagnosis not present

## 2021-07-27 NOTE — Progress Notes (Signed)
      [facility] Counselor/Therapist Progress Note  Patient ID: Jeanette Elliott, MRN: [id number],    Date: 07/27/2021  Time Spent: 51 minutes start time 10:09 AM end time 11:00 AM  Treatment Type: Individual Therapy  Reported Symptoms: anxiety, sadness, sleep issues, intrusive thoughts, chronic pain issues, rumination  Mental Status Exam:  Appearance:   Casual and Neat     Behavior:  Appropriate  Motor:  Normal  Speech/Language:   Normal Rate  Affect:  Appropriate  Mood:  anxious  Thought process:  normal  Thought content:    WNL  Sensory/Perceptual disturbances:    pain  Orientation:  oriented to person, place, time/date, and situation  Attention:  Good  Concentration:  Good  Memory:  WNL  Fund of knowledge:   Good  Insight:    Good  Judgment:   Good  Impulse Control:  Good   Risk Assessment: Danger to Self:  No Self-injurious Behavior: No Danger to Others: No Duty to Warn:no Physical Aggression / Violence:No  Access to Firearms a concern: No  Gang Involvement:No   Subjective: Patient was present for session.  She shared her cat is having health issues and that is upsetting her.She explained that the cat from her marriage is sick so she let her in laws know about the situation.  Patient went on to explain that the thing that is bringing up the biggest concern for her is her father's health did processing set on that issue suds level 10, negative cognition "I am not doing enough" felt worried anger and sadness in her chest.  Patient was able to reduce suds level to 5.  Patient was able to think of some things that she could do to help in the situation.  Discussed the importance of her focusing on her own self-care but also recognizing what she can accomplish.  Gave patient information of things that she can do immediately for her father and for herself.  Interventions: Cognitive Behavioral Therapy, Solution-Oriented/Positive Psychology, and Eye Movement Desensitization and  Reprocessing (EMDR)  Diagnosis:   ICD-10-CM   1. Generalized anxiety disorder  F41.1       Plan: Patient is to use CBT and coping skills to decrease anxiety.  She is to work on trying to follow plans from session to help her and her father appropriately.  Patient is to take medication as directed.  Patient is to try and find ways to get out of her home and be more social. Long term goal: Resolve the core conflict that is the source of anxiety Short term goal: Identify the major life conflicts from the past and present that form the basis for present anxiety  [name], [facility]

## 2021-08-12 ENCOUNTER — Other Ambulatory Visit: Payer: Self-pay

## 2021-08-12 ENCOUNTER — Ambulatory Visit (INDEPENDENT_AMBULATORY_CARE_PROVIDER_SITE_OTHER): Payer: 59 | Admitting: Psychiatry

## 2021-08-12 DIAGNOSIS — F411 Generalized anxiety disorder: Secondary | ICD-10-CM | POA: Diagnosis not present

## 2021-08-12 NOTE — Progress Notes (Signed)
      [facility] Counselor/Therapist Progress Note  Patient ID: Bassheva Flury, MRN: [id number],    Date: 08/12/2021  Time Spent: 51 minutes start time 11:02 AM end time 11:53 AM  Treatment Type: Individual Therapy  Reported Symptoms: anxiety,sadness, focusing issues, low motivation, intrusive thoughts,chronic pain, waking up in panic, rumination, flashbacks  Mental Status Exam:  Appearance:   Casual     Behavior:  Appropriate  Motor:  Normal  Speech/Language:   Normal Rate  Affect:  Appropriate  Mood:  anxious  Thought process:  normal  Thought content:    WNL  Sensory/Perceptual disturbances:    WNL  Orientation:  oriented to person, place, time/date, and situation  Attention:  Good  Concentration:  Good  Memory:  WNL  Fund of knowledge:   Good  Insight:    Good  Judgment:   Good  Impulse Control:  Good   Risk Assessment: Danger to Self:   patient shared that she has the thoughts but nothing she would act on Self-injurious Behavior: No Danger to Others: No Duty to Warn:no Physical Aggression / Violence:No  Access to Firearms a concern: No  Gang Involvement:No   Subjective: Patient was present for session. She shared that she has started trying to do the part-time job.  She shared she is having lots of physical pain because of the situation.  She explained the difficulty she is having with her neuropathy.  Patient went on to explain that even though she is thankful she is finally doing something and getting out of the house it is very difficult for her to realize her limitations and deal with the pain of the residual of even a few hours of being on her feet.  Patient was encouraged to think about the positive things that she can still do rather than focusing on the difficulties and situations in the past.  Encouraged patient to take things 1 step at a time and to work on finding joy and simple things.  Interventions: Cognitive Behavioral Therapy and  Solution-Oriented/Positive Psychology  Diagnosis:   ICD-10-CM   1. Generalized anxiety disorder  F41.1       Plan: Is to use CBT and coping skills to decrease anxiety symptoms.  Patient is to continue to try to find opportunities to have socialization.  Patient is to try and focus on positives.  Patient is to continue working with providers on her medical issues as well as medication management. Long term goal: Resolve the core conflict that is the source of anxiety Short term goal: Identify the major life conflicts from the past and present that form the basis for present anxiety    [name], [facility]

## 2021-08-13 ENCOUNTER — Other Ambulatory Visit: Payer: Self-pay

## 2021-08-13 ENCOUNTER — Encounter: Payer: Self-pay | Admitting: Behavioral Health

## 2021-08-13 ENCOUNTER — Telehealth: Payer: Self-pay

## 2021-08-13 ENCOUNTER — Ambulatory Visit (INDEPENDENT_AMBULATORY_CARE_PROVIDER_SITE_OTHER): Payer: 59 | Admitting: Behavioral Health

## 2021-08-13 DIAGNOSIS — F331 Major depressive disorder, recurrent, moderate: Secondary | ICD-10-CM | POA: Diagnosis not present

## 2021-08-13 DIAGNOSIS — F411 Generalized anxiety disorder: Secondary | ICD-10-CM | POA: Diagnosis not present

## 2021-08-13 MED ORDER — AUVELITY 45-105 MG PO TBCR: 1.0000 | EXTENDED_RELEASE_TABLET | Freq: Two times a day (BID) | ORAL | 0 refills | Status: DC

## 2021-08-13 NOTE — Progress Notes (Signed)
[facility]  Patient ID: Jeanette Elliott,  MRN: [id number]  PCP: Haydee Salter, MD  Date of Evaluation: 08/13/2021 Time spent:30 minutes  Chief Complaint:  Chief Complaint   Anxiety; Depression; Follow-up; Medication Problem     HISTORY/CURRENT STATUS: HPI  40 year old female presents to this office for follow up and medication management. She says that she feel like she has been more emotional since increasing the Wellbutrin to 300 mg. Feels like it is not helping very much. At this point she wants to consider finding another medication to help with depression. She reports anxiety at 2/10 and depression at 7/10. She says she averages about 6 hours sleep per night. Denies mania, no psychosis. No SI/HI.      Previous medication trials:   Venlafaxine Wellbutrin  Paxil      Individual Medical History/ Review of Systems: Changes? :No   Allergies: Latex  Current Medications:  Current Outpatient Medications:    albuterol (VENTOLIN HFA) 108 (90 Base) MCG/ACT inhaler, Inhale 2 puffs into the lungs every 6 (six) hours as needed (asthma attacks)., Disp: 8 g, Rfl: 1   ALPRAZolam (XANAX) 0.5 MG tablet, Take 1-2 tablets 30-60 min prior to MRI. Can take additional tablet at time of test if needed. Must have driver to and from test, can cause drowsiness., Disp: 3 tablet, Rfl: 0   B-D 3CC LUER-LOK SYR 25GX1" 25G X 1" 3 ML MISC, SMARTSIG:Injection, Disp: , Rfl:    buPROPion (WELLBUTRIN XL) 150 MG 24 hr tablet, Take one tablet in the morning every other day., Disp: 30 tablet, Rfl: 1   buPROPion (WELLBUTRIN XL) 300 MG 24 hr tablet, Take 1 tablet (300 mg total) by mouth daily., Disp: 30 tablet, Rfl: 2   Cyanocobalamin 1000 MCG/ML KIT, Inject 1 mL as directed every 30 (thirty) days. Dispense with  1 cc syringe and 27 g 1 inch (or similar) needle, Disp: 3 kit, Rfl: 4   Diclofenac Sodium (PENNSAID) 2 % SOLN, Place 1 application onto the skin 2 (two) times daily., Disp: 112 g, Rfl: 2    EPINEPHrine 0.3 mg/0.3 mL IJ SOAJ injection, Inject 0.3 mg into the muscle once as needed for anaphylaxis., Disp: 1 each, Rfl: 0   escitalopram (LEXAPRO) 20 MG tablet, Take 1 tablet (20 mg total) by mouth daily., Disp: 90 tablet, Rfl: 2   folic acid (FOLVITE) 1 MG tablet, TAKE 1 TABLET(1 MG) BY MOUTH DAILY, Disp: 90 tablet, Rfl: 1   furosemide (LASIX) 20 MG tablet, Take 20 mg by mouth daily., Disp: , Rfl:    gabapentin (NEURONTIN) 300 MG capsule, Take 1 capsule (300 mg total) by mouth 3 (three) times daily., Disp: 90 capsule, Rfl: 11   hydrOXYzine (ATARAX/VISTARIL) 25 MG tablet, Take 2 tablets (50 mg total) by mouth 2 (two) times daily as needed., Disp: 120 tablet, Rfl: 6   MAGNESIUM PO, Take 1 tablet by mouth daily., Disp: , Rfl:    midodrine (PROAMATINE) 5 MG tablet, Take by mouth., Disp: , Rfl:    Naphazoline HCl (CLEAR EYES OP), Place 1 drop into both eyes 2 (two) times daily., Disp: , Rfl:    pantoprazole (PROTONIX) 40 MG tablet, Take 1 tablet (40 mg total) by mouth 2 (two) times daily. Protonix ( pantoprazole ) 40 mg twice daily for 6 weeks, then reduce to 40 mg daily and slowly titrate off., Disp: 60 tablet, Rfl: 0   potassium chloride SA (KLOR-CON) 20 MEQ tablet, Take 1 tablet (20 mEq total) by  mouth daily., Disp: 30 tablet, Rfl: 0   spironolactone (ALDACTONE) 50 MG tablet, TAKE 1 TABLET(50 MG) BY MOUTH EVERY MORNING, Disp: 90 tablet, Rfl: 0   sucralfate (CARAFATE) 1 g tablet, SMARTSIG:1 Tablespoon By Mouth Twice Daily, Disp: , Rfl:    traZODone (DESYREL) 50 MG tablet, Take 1 tablet (50 mg total) by mouth at bedtime., Disp: 30 tablet, Rfl: 1   VITAMIN A PO, Take 1 tablet by mouth daily., Disp: , Rfl:    Vitamin D, Ergocalciferol, (DRISDOL) 1.25 MG (50000 UNIT) CAPS capsule, Take 50,000 Units by mouth once a week., Disp: , Rfl:    ZOLMitriptan (ZOMIG) 2.5 MG tablet, Take 1 tablet (2.5 mg total) by mouth once for 1 dose. May repeat in 2 hours if headache persists or recurs., Disp: 20 tablet,  Rfl: 3 Medication Side Effects: none  Family Medical/ Social History: Changes? No  MENTAL HEALTH EXAM:  There were no vitals taken for this visit.There is no height or weight on file to calculate BMI.  General Appearance: Casual and Neat  Eye Contact:  Good  Speech:  Clear and Coherent  Volume:  Normal  Mood:  Depressed  Affect:  Depressed, Flat, and Anxious  Thought Process:  Coherent  Orientation:  Full (Time, Place, and Person)  Thought Content: Logical   Suicidal Thoughts:  No  Homicidal Thoughts:  No  Memory:  WNL  Judgement:  Good  Insight:  Good  Psychomotor Activity:  Normal  Concentration:  Concentration: Good  Recall:  Good  Fund of Knowledge: Good  Language: Good  Assets:  Desire for Improvement  ADL's:  Intact  Cognition: WNL  Prognosis:  Good    DIAGNOSES:    ICD-10-CM   1. Generalized anxiety disorder  F41.1     2. Major depressive disorder, recurrent episode, moderate ([place])  F33.1       Receiving Psychotherapy:yes [name]   RECOMMENDATIONS:   Greater than 50% of 30  min face to face time with patient was spent on counseling and coordination of care. We discussed minimal improvements with depression. She believes she has been more emotional since taking Wellbutrin so we discussed a change in medication this visit.  However her most recent labs in 05/2021 are normal.   To continue Lexapro 20 mg daily To Start Auvality 45 mg/ 105 mg for 3 days, then 90 mg / 210 mg  divided into two doses separated by at least 8 hours apart.  Monitor for diarrhea, dry mouth, headaches.  To report side effects or worsening symptoms Will follow up in 4 weeks to reevaluate Educated on good sleep hygiene Provided emergency contact info Reviewed [place], NP

## 2021-08-13 NOTE — Telephone Encounter (Signed)
Contacted [facility] in [place], [place] about pt's new Rx for AUVELITY 45-105 MG, they received Rx and I gave them insurance information. The co-pay will be $10.00. They will contact pt and mail to her. Pt does have a sample bottle she received at the office today as well.   Aaron Edelman is also aware.

## 2021-08-16 ENCOUNTER — Other Ambulatory Visit: Payer: Self-pay | Admitting: Family Medicine

## 2021-08-17 ENCOUNTER — Other Ambulatory Visit: Payer: Self-pay

## 2021-08-17 MED ORDER — FUROSEMIDE 20 MG PO TABS
20.0000 mg | ORAL_TABLET | Freq: Every day | ORAL | 3 refills | Status: DC
  Filled 2021-08-17: qty 90, 90d supply, fill #0

## 2021-08-17 NOTE — Telephone Encounter (Signed)
Refill request for: Furosemide 20 mg  LR  hx provider LOV 06/08/21 FOV 09/09/21  Please review and advise. Thanks. Dm/cma

## 2021-08-18 ENCOUNTER — Encounter: Payer: Self-pay | Admitting: Family Medicine

## 2021-08-19 MED ORDER — FUROSEMIDE 20 MG PO TABS: 20.0000 mg | ORAL_TABLET | Freq: Every day | ORAL | 3 refills | Status: DC

## 2021-08-24 ENCOUNTER — Other Ambulatory Visit: Payer: Self-pay

## 2021-08-24 ENCOUNTER — Ambulatory Visit (INDEPENDENT_AMBULATORY_CARE_PROVIDER_SITE_OTHER): Payer: 59

## 2021-08-24 ENCOUNTER — Encounter: Payer: Self-pay | Admitting: Nurse Practitioner

## 2021-08-24 ENCOUNTER — Telehealth: Payer: Self-pay | Admitting: Family Medicine

## 2021-08-24 ENCOUNTER — Ambulatory Visit (INDEPENDENT_AMBULATORY_CARE_PROVIDER_SITE_OTHER): Payer: 59 | Admitting: Nurse Practitioner

## 2021-08-24 VITALS — BP 104/80 | HR 88 | Temp 97.2°F | Ht 67.0 in | Wt 160.2 lb

## 2021-08-24 DIAGNOSIS — M79672 Pain in left foot: Secondary | ICD-10-CM | POA: Diagnosis not present

## 2021-08-24 NOTE — Patient Instructions (Addendum)
Go to lab for foot x-ray Ok to use ibuprofen 422m every 8hrs as needed for pain Apply cold compress every 2hrs while awake, 15-234ms each time. Elevate foot as much as possible. Use surgical shoe if possible.

## 2021-08-24 NOTE — Progress Notes (Signed)
Subjective:  Patient ID: Jeanette Elliott, female    DOB: [birth year]  Age: 40 y.o. MRN: [id number]  CC: Acute Visit (Pt c/o left foot pain in the arch of her foot since last night. Pt states she was just walking and then experienced sharp pain and it has not subsided. )  Foot Injury  The incident occurred 12 to 24 hours ago. The incident occurred at work. The injury mechanism was a twisting injury. The pain is present in the left foot. The quality of the pain is described as aching. The pain is severe. The pain has been Constant since onset. Associated symptoms include an inability to bear weight, numbness and tingling. Pertinent negatives include no loss of motion, loss of sensation or muscle weakness. She reports no foreign bodies present. The symptoms are aggravated by movement, palpation and weight bearing. She has tried nothing for the symptoms.   Reviewed past Medical, Social and Family history today.  Outpatient Medications Prior to Visit  Medication Sig Dispense Refill   albuterol (VENTOLIN HFA) 108 (90 Base) MCG/ACT inhaler Inhale 2 puffs into the lungs every 6 (six) hours as needed (asthma attacks). 8 g 1   B-D 3CC LUER-LOK SYR 25GX1" 25G X 1" 3 ML MISC SMARTSIG:Injection     Cyanocobalamin 1000 MCG/ML KIT Inject 1 mL as directed every 30 (thirty) days. Dispense with  1 cc syringe and 27 g 1 inch (or similar) needle 3 kit 4   Dextromethorphan-buPROPion ER (AUVELITY) 45-105 MG TBCR Take 1 tablet by mouth 2 (two) times daily. 60 tablet 0   Diclofenac Sodium (PENNSAID) 2 % SOLN Place 1 application onto the skin 2 (two) times daily. 112 g 2   EPINEPHrine 0.3 mg/0.3 mL IJ SOAJ injection Inject 0.3 mg into the muscle once as needed for anaphylaxis. 1 each 0   escitalopram (LEXAPRO) 20 MG tablet Take 1 tablet (20 mg total) by mouth daily. 90 tablet 2   folic acid (FOLVITE) 1 MG tablet TAKE 1 TABLET(1 MG) BY MOUTH DAILY 90 tablet 1   furosemide (LASIX) 20 MG tablet Take 1 tablet (20 mg total)  by mouth daily. 90 tablet 3   gabapentin (NEURONTIN) 300 MG capsule Take 1 capsule (300 mg total) by mouth 3 (three) times daily. 90 capsule 11   hydrOXYzine (ATARAX/VISTARIL) 25 MG tablet Take 2 tablets (50 mg total) by mouth 2 (two) times daily as needed. 120 tablet 6   MAGNESIUM PO Take 1 tablet by mouth daily.     midodrine (PROAMATINE) 5 MG tablet Take by mouth.     Naphazoline HCl (CLEAR EYES OP) Place 1 drop into both eyes 2 (two) times daily.     pantoprazole (PROTONIX) 40 MG tablet Take 1 tablet (40 mg total) by mouth 2 (two) times daily. Protonix ( pantoprazole ) 40 mg twice daily for 6 weeks, then reduce to 40 mg daily and slowly titrate off. 60 tablet 0   spironolactone (ALDACTONE) 50 MG tablet TAKE 1 TABLET(50 MG) BY MOUTH EVERY MORNING 90 tablet 0   sucralfate (CARAFATE) 1 g tablet SMARTSIG:1 Tablespoon By Mouth Twice Daily     traZODone (DESYREL) 50 MG tablet Take 1 tablet (50 mg total) by mouth at bedtime. 30 tablet 1   VITAMIN A PO Take 1 tablet by mouth daily.     Vitamin D, Ergocalciferol, (DRISDOL) 1.25 MG (50000 UNIT) CAPS capsule Take 50,000 Units by mouth once a week.     ALPRAZolam (XANAX) 0.5 MG tablet Take 1-2 tablets  30-60 min prior to MRI. Can take additional tablet at time of test if needed. Must have driver to and from test, can cause drowsiness. (Patient not taking: Reported on 08/24/2021) 3 tablet 0   potassium chloride SA (KLOR-CON) 20 MEQ tablet Take 1 tablet (20 mEq total) by mouth daily. 30 tablet 0   ZOLMitriptan (ZOMIG) 2.5 MG tablet Take 1 tablet (2.5 mg total) by mouth once for 1 dose. May repeat in 2 hours if headache persists or recurs. 20 tablet 3   buPROPion (WELLBUTRIN XL) 150 MG 24 hr tablet Take one tablet in the morning every other day. (Patient not taking: Reported on 08/24/2021) 30 tablet 1   buPROPion (WELLBUTRIN XL) 300 MG 24 hr tablet Take 1 tablet (300 mg total) by mouth daily. (Patient not taking: Reported on 08/24/2021) 30 tablet 2   No  facility-administered medications prior to visit.    ROS See HPI  Objective:  BP 104/80 (BP Location: Left Arm, Patient Position: Sitting, Cuff Size: Normal)   Pulse 88   Temp (!) 97.2 F (36.2 C) (Temporal)   Ht 5' 7"  (1.702 m)   Wt 160 lb 3.2 oz (72.7 kg)   SpO2 98%   BMI 25.09 kg/m   Physical Exam Vitals reviewed.  Cardiovascular:     Rate and Rhythm: Normal rate.     Pulses: Normal pulses.  Pulmonary:     Effort: Pulmonary effort is normal.  Musculoskeletal:        General: Swelling, tenderness and signs of injury present.     Right lower leg: No edema.     Left lower leg: No edema.     Right ankle: Normal.     Left ankle: Normal.     Right foot: Normal.     Left foot: Decreased range of motion. Normal capillary refill. Swelling, tenderness and bony tenderness present. No bunion, prominent metatarsal heads, laceration or crepitus. Normal pulse.  Skin:    General: Skin is warm and dry.     Findings: No erythema.  Neurological:     Mental Status: She is alert and oriented to person, place, and time.    Assessment & Plan:  This visit occurred during the SARS-CoV-2 public health emergency.  Safety protocols were in place, including screening questions prior to the visit, additional usage of staff PPE, and extensive cleaning of exam room while observing appropriate contact time as indicated for disinfecting solutions.   Jeanette Elliott was seen today for acute visit.  Diagnoses and all orders for this visit:  Acute foot pain, left -     DG Foot Complete Left Reviewed radiology images: no obvious bone fracture. Waiting for an overread by radiologist. Madaline Brilliant to use ibuprofen 458m every 8hrs as needed for pain Apply cold compress every 2hrs while awake, 15-259ms each time. Elevate foot as much as possible. Use surgical shoe if possible or ACE wrap  Problem List Items Addressed This Visit   None Visit Diagnoses     Acute foot pain, left    -  Primary   Relevant Orders    DG Foot Complete Left       Follow-up: Return if symptoms worsen or fail to improve.  ChWilfred LacyNP

## 2021-08-25 ENCOUNTER — Encounter: Payer: Self-pay | Admitting: Nurse Practitioner

## 2021-08-26 NOTE — Telephone Encounter (Signed)
Pt notified of results on 08/25/21

## 2021-08-27 ENCOUNTER — Ambulatory Visit (INDEPENDENT_AMBULATORY_CARE_PROVIDER_SITE_OTHER): Payer: 59 | Admitting: Psychiatry

## 2021-08-27 ENCOUNTER — Other Ambulatory Visit: Payer: Self-pay

## 2021-08-27 DIAGNOSIS — F411 Generalized anxiety disorder: Secondary | ICD-10-CM

## 2021-08-27 NOTE — Progress Notes (Signed)
°      [facility] Counselor/Therapist Progress Note  Patient ID: Jeanette Elliott, MRN: [id number],    Date: 08/27/2021  Time Spent: 57 minutes start time 10:10 AM end time 12:07 PM  Treatment Type: Individual Therapy  Reported Symptoms: anxiety, sadness, pain, sleep issues, focusing issues, memory issues, rumination, fatigue  Mental Status Exam:  Appearance:   Casual and Neat     Behavior:  Appropriate  Motor:  Normal  Speech/Language:   Normal Rate  Affect:  Labile  Mood:  labile  Thought process:  normal  Thought content:    WNL  Sensory/Perceptual disturbances:    Pain issues   Orientation:  oriented to person, place, time/date, and situation  Attention:  Good  Concentration:  Good  Memory:  WNL  Fund of knowledge:   Good  Insight:    Good  Judgment:   Good  Impulse Control:  Good   Risk Assessment: Danger to Self:  No Self-injurious Behavior: No Danger to Others: No Duty to Warn:no Physical Aggression / Violence:No  Access to Firearms a concern: No  Gang Involvement:No   Subjective: Patient was present for session.  She shared she had hurt her foot and is supposed to wear a boot now.  She went on to share she is feeling blah and think it may be due to the weather. She is working some and that has been helpful but she got injured which is discouraging for her.  Patient shared that she has some other medical issues that are starting to make her concerned but she is trying to hold off and wait for doctor's appointments to figure out what is completely going on with her.  Patient again discussed the difficulty she is having with not being able to do the things that she is used to doing.  Patient was encouraged to start thinking through what she can do when she is at home by herself even with her limited physical capabilities.  Discussed some different options to kind of start looking into for patient.  Patient also shared that the ruminating thoughts are keeping her up at  night.  Encouraged different strategies to help her release those so that she can rest including doing a brain dump each night just to get all of the tangential thoughts on paper but she is not to read it and just try to relax and go to bed.  Interventions: Cognitive Behavioral Therapy and Solution-Oriented/Positive Psychology  Diagnosis:   ICD-10-CM   1. Generalized anxiety disorder  F41.1       Plan: Patient is to use CBT and coping skills to decrease anxiety symptoms.  Patient is to do a brain dump at night and write as quickly as she can for a few minutes then close the journal.  Patient is to consider using brain spotting bilateral music to calm herself as needed.  Patient is to continue working on pursuing different positive activities that she can do with her physical limitations to keep her brain engaged in positive.  Patient is to take medication as directed. Long term goal: Resolve the core conflict that is the source of anxiety Short term goal: Identify the major life conflicts from the past and present that form the basis for present anxiety  [name], [facility]

## 2021-09-01 ENCOUNTER — Ambulatory Visit: Payer: 59 | Admitting: Neurology

## 2021-09-09 ENCOUNTER — Encounter: Payer: Self-pay | Admitting: Family Medicine

## 2021-09-09 ENCOUNTER — Other Ambulatory Visit: Payer: Self-pay

## 2021-09-09 ENCOUNTER — Ambulatory Visit (INDEPENDENT_AMBULATORY_CARE_PROVIDER_SITE_OTHER): Payer: 59

## 2021-09-09 ENCOUNTER — Ambulatory Visit (INDEPENDENT_AMBULATORY_CARE_PROVIDER_SITE_OTHER): Payer: 59 | Admitting: Family Medicine

## 2021-09-09 VITALS — BP 124/76 | HR 84 | Temp 97.5°F | Ht 67.0 in | Wt 165.6 lb

## 2021-09-09 DIAGNOSIS — M8580 Other specified disorders of bone density and structure, unspecified site: Secondary | ICD-10-CM | POA: Diagnosis not present

## 2021-09-09 DIAGNOSIS — Z8781 Personal history of (healed) traumatic fracture: Secondary | ICD-10-CM | POA: Diagnosis not present

## 2021-09-09 DIAGNOSIS — R188 Other ascites: Secondary | ICD-10-CM | POA: Diagnosis not present

## 2021-09-09 DIAGNOSIS — K7460 Unspecified cirrhosis of liver: Secondary | ICD-10-CM | POA: Diagnosis not present

## 2021-09-09 DIAGNOSIS — F1021 Alcohol dependence, in remission: Secondary | ICD-10-CM

## 2021-09-09 DIAGNOSIS — I739 Peripheral vascular disease, unspecified: Secondary | ICD-10-CM | POA: Insufficient documentation

## 2021-09-09 NOTE — Progress Notes (Signed)
[facility] [address] [place] [address] Dept: [phone] Dept Fax: [phone]  Chronic Care Office Visit  Subjective:    Patient ID: Jeanette Elliott, female    DOB: 17-Sep-1980, 40 y.o..   MRN: [id number]  Chief Complaint  Patient presents with   Follow-up    3 month f/u. Needs to see GI again with in [place] and ? [place].     History of Present Illness:  Patient is in today for reassessment of chronic medical issues.  Jeanette Elliott has a history of alcohol dependence. She remains in remission at this point, maintaining her sobriety. Her alcoholism had been complicated by cirrhosis. However, she has had marked improvement in her hepatic and hematologic abnormalities. She notes the [facility] has released her. She is due for lab and ultrasound follow-up. She needs a referral back to Dr. Lucille Passy for ongoing following of her liver disease.   Jeanette Elliott is seeing Dr. Felecia Shelling (neurology) related to a peripheral neuropathy. He has conducted nerve conduciton studies showing moderate axonal sensorimotor polyneuropathy. He is managing her with gabapentin for neuropathic pain. She is still noticing some significant discomfort.  She was also seen by Dr. Katy Fitch (ophthalmology) related to visual difficulties. Reportedly, he did not appreciate any specific eye issue, but reported some minor findings. Dr. Katy Fitch had the neurologist consider an MRI scan to assess, which she has now completed. These were normal.   Jeanette Elliott has had bilateral hip pain for some years. She has episodes where her hips pop, L>R. She was seen by Dr. Raeford Razor who felt she has some sacroiliac joint dysfunction and hip impingement. She is using some Voltaren gel for this.  Jeanette Elliott notes she had ORIF of a left thumb fracture last Spring. she had some popping in the thumb yesterday and was worried that something might have happened to this.   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   Sacroiliac joint dysfunction of right side 06/18/2021   B12 deficiency 05/06/2021   Normocytic anemia 05/06/2021   Neuropathy 05/05/2021   Duodenal ulcer 05/05/2021   Asthma 05/05/2021   Hymenoptera allergy 05/05/2021   Alcohol dependence in remission ([place]) 03/04/2021   Anxiety    Cirrhosis ([place]) 02/02/2021   S/P thoracentesis    Closed fracture of rib of left side with routine healing    Fall    Dysplasia of cervix, low grade (CIN 1) 09/22/2016   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   Sjogren's syndrome ([place]) 07/20/2016   Major depressive disorder, recurrent episode ([place]) 05/16/2013   Past Surgical History:  Procedure Laterality Date   IR PARACENTESIS  02/03/2021   IR THORACENTESIS ASP PLEURAL SPACE W/IMG GUIDE  01/16/2021   ORIF FINGER / THUMB FRACTURE Left    Thumb   SUBMANDIBULAR GLAND EXCISION     TONSILLECTOMY AND ADENOIDECTOMY     Family History  Problem Relation Age of Onset   Arthritis Mother    Heart disease Father    Cancer Father        Prostate   Arthritis Father    Heart disease Maternal Grandmother    Stroke Maternal Grandmother    Depression Maternal Grandmother    Alzheimer's disease Maternal Grandfather    Heart disease Paternal Grandfather    Heart disease Paternal Uncle    Colon cancer Neg Hx    Esophageal cancer Neg  Hx    Outpatient Medications Prior to Visit  Medication Sig Dispense Refill   albuterol (VENTOLIN HFA) 108 (90 Base) MCG/ACT inhaler Inhale 2 puffs into the lungs every 6 (six) hours as needed (asthma attacks). 8 g 1   ALPRAZolam (XANAX) 0.5 MG tablet Take 1-2 tablets 30-60 min prior to MRI. Can take additional tablet at time of test if needed. Must have driver to and from test, can cause drowsiness. 3 tablet 0   B-D 3CC LUER-LOK SYR 25GX1" 25G X 1" 3 ML MISC SMARTSIG:Injection     Cyanocobalamin 1000 MCG/ML  KIT Inject 1 mL as directed every 30 (thirty) days. Dispense with  1 cc syringe and 27 g 1 inch (or similar) needle 3 kit 4   Dextromethorphan-buPROPion ER (AUVELITY) 45-105 MG TBCR Take 1 tablet by mouth 2 (two) times daily. 60 tablet 0   EPINEPHrine 0.3 mg/0.3 mL IJ SOAJ injection Inject 0.3 mg into the muscle once as needed for anaphylaxis. 1 each 0   escitalopram (LEXAPRO) 20 MG tablet Take 1 tablet (20 mg total) by mouth daily. 90 tablet 2   folic acid (FOLVITE) 1 MG tablet TAKE 1 TABLET(1 MG) BY MOUTH DAILY 90 tablet 1   furosemide (LASIX) 20 MG tablet Take 1 tablet (20 mg total) by mouth daily. 90 tablet 3   gabapentin (NEURONTIN) 300 MG capsule Take 1 capsule (300 mg total) by mouth 3 (three) times daily. 90 capsule 11   hydrOXYzine (ATARAX/VISTARIL) 25 MG tablet Take 2 tablets (50 mg total) by mouth 2 (two) times daily as needed. 120 tablet 6   MAGNESIUM PO Take 1 tablet by mouth daily.     midodrine (PROAMATINE) 5 MG tablet Take by mouth.     Naphazoline HCl (CLEAR EYES OP) Place 1 drop into both eyes 2 (two) times daily.     pantoprazole (PROTONIX) 40 MG tablet Take 1 tablet (40 mg total) by mouth 2 (two) times daily. Protonix ( pantoprazole ) 40 mg twice daily for 6 weeks, then reduce to 40 mg daily and slowly titrate off. 60 tablet 0   potassium chloride SA (KLOR-CON) 20 MEQ tablet Take 1 tablet (20 mEq total) by mouth daily. 30 tablet 0   spironolactone (ALDACTONE) 50 MG tablet TAKE 1 TABLET(50 MG) BY MOUTH EVERY MORNING 90 tablet 0   traZODone (DESYREL) 50 MG tablet Take 1 tablet (50 mg total) by mouth at bedtime. 30 tablet 1   VITAMIN A PO Take 1 tablet by mouth daily.     Vitamin D, Ergocalciferol, (DRISDOL) 1.25 MG (50000 UNIT) CAPS capsule Take 50,000 Units by mouth once a week.     ZOLMitriptan (ZOMIG) 2.5 MG tablet Take 1 tablet (2.5 mg total) by mouth once for 1 dose. May repeat in 2 hours if headache persists or recurs. 20 tablet 3   Diclofenac Sodium (PENNSAID) 2 % SOLN  Place 1 application onto the skin 2 (two) times daily. 112 g 2   sucralfate (CARAFATE) 1 g tablet SMARTSIG:1 Tablespoon By Mouth Twice Daily (Patient not taking: Reported on 09/09/2021)     No facility-administered medications prior to visit.   Allergies  Allergen Reactions   Latex Itching and Rash     Objective:   Today's Vitals   09/09/21 1602  BP: 124/76  Pulse: 84  Temp: (!) 97.5 F (36.4 C)  TempSrc: Temporal  SpO2: 96%  Weight: 165 lb 9.6 oz (75.1 kg)  Height: 5' 7"  (1.702 m)   Body mass index is  25.94 kg/m.   General: Well developed, well nourished. No acute distress. Extremities: Well healed surgical scar over left thumb. No laxity noted. Psych: Alert and oriented. Normal mood and affect.  Health Maintenance Due  Topic Date Due   TETANUS/TDAP  Never done   PAP SMEAR-Modifier  Never done   COVID-19 Vaccine (4 - Booster for Moderna series) 06/22/2021   Imaging: Left Thumb x-ray- Plate and multiple screws in place. No sign of acute fracture.  MR Obrits w wo contrast (06/23/2021) IMPRESSION: This is a normal MRI of the orbits with and without contrast.  MR Brain w wo contrast (06/23/2021) IMPRESSION: This is a normal MRI of the brain with and without contrast.    Assessment & Plan:   1. Cirrhosis of liver with ascites, unspecified hepatic cirrhosis type ([place]) I will check labs to assess current liver status. I will refer her for na ultrasound. I will also refer her to Dr. Bryan Lemma to continue to follow her liver disease.  - Ambulatory referral to Gastroenterology - US Abdomen Limited RUQ (LIVER/GB); Future - TSH - Hepatic function panel - CBC - Protime-INR ( [facility] ONLY)  2. Alcohol dependence in remission ([place]) I encouraged MS. Prom to continue her sobriety, as this has allowed significant improvement in her cirrhosis.  3. History of thumb fracture, s/p ORIF No sign of fracture.  - DG Finger Thumb Left  4. Osteopenia determined by  x-ray Recommend Ms. Saline continue her OTC Vit D supplement, but also add a calcium supplement.    Haydee Salter, MD

## 2021-09-10 ENCOUNTER — Telehealth: Payer: Self-pay | Admitting: Family Medicine

## 2021-09-10 ENCOUNTER — Ambulatory Visit (HOSPITAL_BASED_OUTPATIENT_CLINIC_OR_DEPARTMENT_OTHER)
Admission: RE | Admit: 2021-09-10 | Discharge: 2021-09-10 | Disposition: A | Discharge status: Home / Self Care | Payer: 59 | Source: Ambulatory Visit | Attending: Family Medicine | Admitting: Family Medicine

## 2021-09-10 DIAGNOSIS — R188 Other ascites: Secondary | ICD-10-CM | POA: Insufficient documentation

## 2021-09-10 DIAGNOSIS — K7460 Unspecified cirrhosis of liver: Secondary | ICD-10-CM | POA: Insufficient documentation

## 2021-09-10 LAB — CBC
HCT: 34.5 % — ABNORMAL LOW (ref 36.0–46.0)
Hemoglobin: 11.5 g/dL — ABNORMAL LOW (ref 12.0–15.0)
MCHC: 33.2 g/dL (ref 30.0–36.0)
MCV: 87 fl (ref 78.0–100.0)
Platelets: 194 10*3/uL (ref 150.0–400.0)
RBC: 3.97 Mil/uL (ref 3.87–5.11)
RDW: 16.8 % — ABNORMAL HIGH (ref 11.5–15.5)
WBC: 6.5 10*3/uL (ref 4.0–10.5)

## 2021-09-10 LAB — HEPATIC FUNCTION PANEL
ALT: 38 U/L — ABNORMAL HIGH (ref 0–35)
AST: 77 U/L — ABNORMAL HIGH (ref 0–37)
Albumin: 4.3 g/dL (ref 3.5–5.2)
Alkaline Phosphatase: 171 U/L — ABNORMAL HIGH (ref 39–117)
Bilirubin, Direct: 0.1 mg/dL (ref 0.0–0.3)
Total Bilirubin: 0.6 mg/dL (ref 0.2–1.2)
Total Protein: 7.4 g/dL (ref 6.0–8.3)

## 2021-09-10 LAB — PROTIME-INR
INR: 1
Prothrombin Time: 10.5 s (ref 9.0–11.5)

## 2021-09-10 LAB — TSH: TSH: 1.51 u[IU]/mL (ref 0.35–5.50)

## 2021-09-10 NOTE — Telephone Encounter (Signed)
Left VM to rtn call. Dm/cma       

## 2021-09-10 NOTE — Telephone Encounter (Signed)
Pt wants to know if she needs to stop taking the midodrine for blood pressure.

## 2021-09-10 NOTE — Telephone Encounter (Signed)
Spoke to patient, advised on instructions.  No further questions.  Dm/cma

## 2021-09-11 ENCOUNTER — Ambulatory Visit: Payer: 59 | Admitting: Behavioral Health

## 2021-09-11 ENCOUNTER — Ambulatory Visit: Payer: 59 | Admitting: Family Medicine

## 2021-09-14 ENCOUNTER — Encounter: Payer: Self-pay | Admitting: Family Medicine

## 2021-09-14 DIAGNOSIS — K8020 Calculus of gallbladder without cholecystitis without obstruction: Secondary | ICD-10-CM | POA: Insufficient documentation

## 2021-09-17 ENCOUNTER — Ambulatory Visit (INDEPENDENT_AMBULATORY_CARE_PROVIDER_SITE_OTHER): Payer: 59 | Admitting: Psychiatry

## 2021-09-17 ENCOUNTER — Other Ambulatory Visit: Payer: Self-pay

## 2021-09-17 DIAGNOSIS — F411 Generalized anxiety disorder: Secondary | ICD-10-CM | POA: Diagnosis not present

## 2021-09-17 NOTE — Progress Notes (Signed)
[facility] Counselor/Therapist Progress Note  Patient ID: Jeanette Elliott, MRN: [id number],    Date: 09/17/2021  Time Spent: 60 minutes start time 1:05 PM end time 2:05 PM  Treatment Type: Individual Therapy  Reported Symptoms: anxiety, health issues, weight gain,sadness, sadness, frustration, rumination, focusing issues, fatigue  Mental Status Exam:  Appearance:   Well Groomed     Behavior:  Appropriate  Motor:  Normal  Speech/Language:   Normal Rate  Affect:  Appropriate  Mood:  sad  Thought process:  normal  Thought content:    WNL  Sensory/Perceptual disturbances:    WNL  Orientation:  oriented to person, place, time/date, and situation  Attention:  Good  Concentration:  Good  Memory:  WNL  Fund of knowledge:   Good  Insight:    Good  Judgment:   Good  Impulse Control:  Good   Risk Assessment: Danger to Self:  No Self-injurious Behavior: No Danger to Others: No Duty to Warn:no Physical Aggression / Violence:No  Access to Firearms a concern: No  Gang Involvement:No   Subjective: Patient was present for session.  She shared she is frustrated due to medical concerns.  She went on to share that she is struggling with focusing and hasn't been able to get things completed. She also had a falling out with a friend which was hard.  Patient explained that the biggest thing for her is feeling unable to do any of the things that she wants to do to take care of herself.  Patient shared she is struggling with her eyesight and the eye doctor felt it could be due to being vegetarian.  Encouraged patient to research if that were true and to try and find ways to get the appropriate nutrients in her body.  Also encouraged her to think about may be doing something online to let others know about the issue and concerns so she can feel productive.  Patient was also encouraged to look into options of finding more connection so that she is not alone so much of the time and with her own  thoughts.  Patient acknowledged that she is trying to find part-time work that is realistic for her so that she is not so dependent on her mother and father but is finding that very difficult.  Patient shared she has been reflecting on her past relationships and 2 of the guys that she dated currently have babies which is what she wants very badly now and she is started wondering if it was her fault and that she was an issue.  Patient was encouraged to recognize that her husband was an addict and is still is so she could not change his behavior.  She was also encouraged to remind herself that regardless of their choices and what is going on with them she is still enough and all she can do is to take things 1 step at a time.  Encouraged patient to use the time she has to explore options as to what she could have for her future and ways to feel more productive and like she is making a difference.  Patient was reminded of the CBT cycle thoughts lead to feelings lead to behaviors and she has to focus on her thoughts.  Interventions: Cognitive Behavioral Therapy and Solution-Oriented/Positive Psychology  Diagnosis:   ICD-10-CM   1. Generalized anxiety disorder  F41.1       Plan: Patient is to use CBT and coping skills to decrease anxiety symptoms.  Patient is to work on trying to find ways that she can make some extra money at home or at least to get information out to others so she can feel productive.  Patient is to work on reminding herself to focus on the things that she can control fix and change.  Patient is to research what she needs to make sure she is getting in her system with being a vegetarian to help her eye health and physical health.  Patient is to take medication as directed. Long term goal: Resolve the core conflict that is the source of anxiety Short term goal: Identify the major life conflicts from the past and present that form the basis for present anxiety  [name],  [facility]

## 2021-09-24 ENCOUNTER — Other Ambulatory Visit: Payer: Self-pay | Admitting: Behavioral Health

## 2021-09-24 DIAGNOSIS — F411 Generalized anxiety disorder: Secondary | ICD-10-CM

## 2021-09-24 DIAGNOSIS — F331 Major depressive disorder, recurrent, moderate: Secondary | ICD-10-CM

## 2021-09-29 ENCOUNTER — Encounter: Payer: Self-pay | Admitting: Neurology

## 2021-09-29 ENCOUNTER — Ambulatory Visit (INDEPENDENT_AMBULATORY_CARE_PROVIDER_SITE_OTHER): Payer: 59 | Admitting: Neurology

## 2021-09-29 ENCOUNTER — Other Ambulatory Visit: Payer: Self-pay

## 2021-09-29 VITALS — BP 118/86 | HR 99 | Ht 67.0 in | Wt 165.3 lb

## 2021-09-29 DIAGNOSIS — G629 Polyneuropathy, unspecified: Secondary | ICD-10-CM | POA: Diagnosis not present

## 2021-09-29 DIAGNOSIS — H543 Unqualified visual loss, both eyes: Secondary | ICD-10-CM | POA: Diagnosis not present

## 2021-09-29 DIAGNOSIS — R260 Ataxic gait: Secondary | ICD-10-CM | POA: Diagnosis not present

## 2021-09-29 DIAGNOSIS — R200 Anesthesia of skin: Secondary | ICD-10-CM

## 2021-09-29 MED ORDER — GABAPENTIN 600 MG PO TABS: 600.0000 mg | ORAL_TABLET | Freq: Three times a day (TID) | ORAL | 11 refills | Status: DC
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
## 2021-09-30 ENCOUNTER — Telehealth: Payer: Self-pay | Admitting: Neurology

## 2021-09-30 ENCOUNTER — Ambulatory Visit: Payer: 59 | Admitting: Psychiatry

## 2021-09-30 ENCOUNTER — Encounter: Payer: Self-pay | Admitting: Behavioral Health

## 2021-09-30 ENCOUNTER — Ambulatory Visit (INDEPENDENT_AMBULATORY_CARE_PROVIDER_SITE_OTHER): Payer: 59 | Admitting: Behavioral Health

## 2021-09-30 ENCOUNTER — Other Ambulatory Visit: Payer: Self-pay

## 2021-09-30 DIAGNOSIS — F331 Major depressive disorder, recurrent, moderate: Secondary | ICD-10-CM | POA: Diagnosis not present

## 2021-09-30 DIAGNOSIS — F411 Generalized anxiety disorder: Secondary | ICD-10-CM

## 2021-09-30 MED ORDER — AUVELITY 45-105 MG PO TBCR: 1.0000 | EXTENDED_RELEASE_TABLET | Freq: Two times a day (BID) | ORAL | 0 refills | Status: DC

## 2021-09-30 NOTE — Telephone Encounter (Signed)
[employer] pending faxed notes  °

## 2021-09-30 NOTE — Progress Notes (Signed)
[facility]  Patient ID: Jeanette Elliott,  MRN: [id number]  PCP: Haydee Salter, MD  Date of Evaluation: 09/30/2021 Time spent:30 minutes  Chief Complaint:  Chief Complaint   Anxiety; Depression; Follow-up; Medication Refill; Patient Education     HISTORY/CURRENT STATUS: HPI 41 year old female presents to this office for follow up and medication management. She says that she feels like Auvelity has significantly helped her depressive symptoms since last visit. She is happy with this medication. She is complaining of continued lack of motivation and fatigue.  She says she wants to try taking 1/2 tablet of Trazodone because she feels extra somnolent in the morning. She does not want to make adjustment to her medications this visit. She reports anxiety at 2/10 and depression at 3/10. She says she averages about 7 hours sleep per night. Denies mania, no psychosis. No SI/HI.      Previous medication trials:   Venlafaxine Wellbutrin  Paxil   Individual Medical History/ Review of Systems: Changes? :No   Allergies: Latex  Current Medications:  Current Outpatient Medications:    albuterol (VENTOLIN HFA) 108 (90 Base) MCG/ACT inhaler, Inhale 2 puffs into the lungs every 6 (six) hours as needed (asthma attacks)., Disp: 8 g, Rfl: 1   B-D 3CC LUER-LOK SYR 25GX1" 25G X 1" 3 ML MISC, SMARTSIG:Injection, Disp: , Rfl:    cholecalciferol (VITAMIN D3) 25 MCG (1000 UNIT) tablet, Take 1,000 Units by mouth daily., Disp: , Rfl:    Cyanocobalamin 1000 MCG/ML KIT, Inject 1 mL as directed every 30 (thirty) days. Dispense with  1 cc syringe and 27 g 1 inch (or similar) needle, Disp: 3 kit, Rfl: 4   Dextromethorphan-buPROPion ER (AUVELITY) 45-105 MG TBCR, Take 1 tablet by mouth 2 (two) times daily., Disp: 60 tablet, Rfl: 0   EPINEPHrine 0.3 mg/0.3 mL IJ SOAJ injection, Inject 0.3 mg into the muscle once as needed for anaphylaxis., Disp: 1 each, Rfl: 0   escitalopram (LEXAPRO) 20 MG tablet, Take  1 tablet (20 mg total) by mouth daily., Disp: 90 tablet, Rfl: 2   folic acid (FOLVITE) 1 MG tablet, TAKE 1 TABLET(1 MG) BY MOUTH DAILY, Disp: 90 tablet, Rfl: 1   furosemide (LASIX) 20 MG tablet, Take 1 tablet (20 mg total) by mouth daily., Disp: 90 tablet, Rfl: 3   gabapentin (NEURONTIN) 600 MG tablet, Take 1 tablet (600 mg total) by mouth 3 (three) times daily., Disp: 90 tablet, Rfl: 11   hydrOXYzine (ATARAX/VISTARIL) 25 MG tablet, Take 2 tablets (50 mg total) by mouth 2 (two) times daily as needed., Disp: 120 tablet, Rfl: 6   MAGNESIUM PO, Take 1 tablet by mouth daily., Disp: , Rfl:    midodrine (PROAMATINE) 5 MG tablet, Take by mouth., Disp: , Rfl:    Naphazoline HCl (CLEAR EYES OP), Place 1 drop into both eyes 2 (two) times daily., Disp: , Rfl:    pantoprazole (PROTONIX) 40 MG tablet, Take 1 tablet (40 mg total) by mouth 2 (two) times daily. Protonix ( pantoprazole ) 40 mg twice daily for 6 weeks, then reduce to 40 mg daily and slowly titrate off., Disp: 60 tablet, Rfl: 0   potassium chloride SA (KLOR-CON) 20 MEQ tablet, Take 1 tablet (20 mEq total) by mouth daily., Disp: 30 tablet, Rfl: 0   spironolactone (ALDACTONE) 50 MG tablet, TAKE 1 TABLET(50 MG) BY MOUTH EVERY MORNING, Disp: 90 tablet, Rfl: 0   traZODone (DESYREL) 50 MG tablet, TAKE 1 TABLET(50 MG) BY MOUTH AT BEDTIME, Disp:  30 tablet, Rfl: 0   VITAMIN A PO, Take 1 tablet by mouth daily., Disp: , Rfl:    ZOLMitriptan (ZOMIG) 2.5 MG tablet, Take 1 tablet (2.5 mg total) by mouth once for 1 dose. May repeat in 2 hours if headache persists or recurs., Disp: 20 tablet, Rfl: 3 Medication Side Effects: none  Family Medical/ Social History: Changes? No  MENTAL HEALTH EXAM:  Last menstrual period 08/31/2021.There is no height or weight on file to calculate BMI.  General Appearance: Casual and Neat  Eye Contact:  Good  Speech:  Clear and Coherent  Volume:  Normal  Mood:  NA  Affect:  Appropriate  Thought Process:  Coherent  Orientation:   Full (Time, Place, and Person)  Thought Content: Logical   Suicidal Thoughts:  No  Homicidal Thoughts:  No  Memory:  WNL  Judgement:  Good  Insight:  Good  Psychomotor Activity:  Normal  Concentration:  Concentration: Good  Recall:  Good  Fund of Knowledge: Good  Language: Good  Assets:  Desire for Improvement  ADL's:  Intact  Cognition: WNL  Prognosis:  Good    DIAGNOSES:    ICD-10-CM   1. Generalized anxiety disorder  F41.1 Dextromethorphan-buPROPion ER (AUVELITY) 45-105 MG TBCR    2. Major depressive disorder, recurrent episode, moderate (HCC)  F33.1 Dextromethorphan-buPROPion ER (AUVELITY) 45-105 MG TBCR      Receiving Psychotherapy: Yes Jeanette Elliott   RECOMMENDATIONS:   Greater than 50% of 30  min face to face time with patient was spent on counseling and coordination of care. We discussed her moderate improvements with depression. She is happy with how Jeanette Elliott is working. She is still concerned with lack of motivation and fatigue and I explained that she is on high daily dose of gabapentin (1800 mg daily) prescribed by neurology.  Her most recent Liver Function Panel was WNL.  To continue Lexapro 20 mg daily To continue Auvelity 90/210 daily Continue Trazodone 50 mg at bedtime. May take half tablet if to somnolent in the am.  To report side effects or worsening symptoms Will follow up in 2 months to reevaluate Educated on good sleep hygiene Provided emergency contact info Reviewed Harris, NP

## 2021-10-01 MED ORDER — ALPRAZOLAM 0.5 MG PO TABS: 0.5000 mg | ORAL_TABLET | Freq: Once | ORAL | 0 refills | Status: DC | PRN

## 2021-10-01 NOTE — Telephone Encounter (Signed)
[employer]: [id number] (exp. 09/30/21 to 12/29/21)  Patient is scheduled at [facility] for 10/07/21.  She informed me she is claustrophobic and would need something to help her. She is aware to have a driver.

## 2021-10-07 ENCOUNTER — Ambulatory Visit: Payer: 59

## 2021-10-07 ENCOUNTER — Other Ambulatory Visit: Payer: Self-pay

## 2021-10-07 DIAGNOSIS — R200 Anesthesia of skin: Secondary | ICD-10-CM

## 2021-10-07 DIAGNOSIS — R260 Ataxic gait: Secondary | ICD-10-CM

## 2021-10-24 ENCOUNTER — Other Ambulatory Visit: Payer: Self-pay | Admitting: Behavioral Health

## 2021-10-24 DIAGNOSIS — F411 Generalized anxiety disorder: Secondary | ICD-10-CM

## 2021-10-24 DIAGNOSIS — F331 Major depressive disorder, recurrent, moderate: Secondary | ICD-10-CM

## 2021-10-27 ENCOUNTER — Other Ambulatory Visit: Payer: Self-pay | Admitting: Behavioral Health

## 2021-10-27 DIAGNOSIS — F411 Generalized anxiety disorder: Secondary | ICD-10-CM

## 2021-10-27 DIAGNOSIS — F331 Major depressive disorder, recurrent, moderate: Secondary | ICD-10-CM

## 2021-11-04 ENCOUNTER — Telehealth: Payer: Self-pay | Admitting: Psychiatry

## 2021-11-04 ENCOUNTER — Telehealth (INDEPENDENT_AMBULATORY_CARE_PROVIDER_SITE_OTHER): Payer: 59 | Admitting: Psychiatry

## 2021-11-04 DIAGNOSIS — F411 Generalized anxiety disorder: Secondary | ICD-10-CM

## 2021-11-04 NOTE — Telephone Encounter (Signed)
[name], glade are scheduled for a virtual visit with your provider today.    Just as we do with appointments in the office, we must obtain your consent to participate.  Your consent will be active for this visit and any virtual visit you may have with one of our providers in the next 365 days.    If you have a MyChart account, I can also send a copy of this consent to you electronically.  All virtual visits are billed to your insurance company just like a traditional visit in the office.  As this is a virtual visit, video technology does not allow for your provider to perform a traditional examination.  This may limit your provider's ability to fully assess your condition.  If your provider identifies any concerns that need to be evaluated in person or the need to arrange testing such as labs, EKG, etc, we will make arrangements to do so.    Although advances in technology are sophisticated, we cannot ensure that it will always work on either your end or our end.  If the connection with a video visit is poor, we may have to switch to a telephone visit.  With either a video or telephone visit, we are not always able to ensure that we have a secure connection.   I need to obtain your verbal consent now.   Are you willing to proceed with your visit today?   Jeanette Elliott has provided verbal consent on 11/04/2021 for a virtual visit (video or telephone).   Stevphen Meuse, [facility] 11/04/2021  11:10 AM

## 2021-11-04 NOTE — Progress Notes (Signed)
[facility] Counselor/Therapist Progress Note  Patient ID: Jeanette Elliott, MRN: [id number],    Date: 11/04/2021  Time Spent: 52 minute start time 11:09 AM Video 11:09 AM through 11:25 AM Phone 11:25 AM through 12:01 PM Virtual Visit via Video Note Connected with patient by a telemedicine/telehealth application, with their informed consent, and verified patient privacy and that I am speaking with the correct person using two identifiers. I discussed the limitations, risks, security and privacy concerns of performing psychotherapy and the availability of in person appointments. I also discussed with the patient that there may be a patient responsible charge related to this service. The patient expressed understanding and agreed to proceed. I discussed the treatment planning with the patient. The patient was provided an opportunity to ask questions and all were answered. The patient agreed with the plan and demonstrated an understanding of the instructions. The patient was advised to call  our office if  symptoms worsen or feel they are in a crisis state and need immediate contact.   Therapist Location: office Patient Location: home     Treatment Type: Individual Therapy  Reported Symptoms: anxiety, sadness, grief issues, anger, rumination, triggering responses, crying spells  Mental Status Exam:  Appearance:   Casual     Behavior:  Appropriate  Motor:  Normal  Speech/Language:   Normal Rate  Affect:  Appropriate tearful  Mood:  anxious and sad  Thought process:  normal  Thought content:    WNL  Sensory/Perceptual disturbances:    WNL  Orientation:  oriented to person, place, time/date, and situation  Attention:  Good  Concentration:  Good  Memory:  WNL  Fund of knowledge:   Good  Insight:    Good  Judgment:   Good  Impulse Control:  Good   Risk Assessment: Danger to Self:  No but she is having passive thoughts that it would be okay if she died Self-injurious Behavior:  No Danger to Others: No Duty to Warn:no Physical Aggression / Violence:No  Access to Firearms a concern: No  Gang Involvement:No   Subjective: Met with patient via virtual session.  She shared that she had started working at [employer].  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  Discussed the situation and how she can talk herself through continuing to work at the facility even the her cat is no longer with her.  Patient also shared she is wanting to have more intimacy.  She discussed the different situation she has currently with people and what she would want from the different relationships.  Patient was encouraged to remind herself that she does have good judgment and can make good decisions she just has to trust herself.  Ways to talk herself through that were discussed with patient.  Patient agreed to continue working on her self-care and trying to find more activities that were allow her more time to interact with others appropriately.  Interventions: Cognitive Behavioral Therapy and Solution-Oriented/Positive Psychology  Diagnosis:   ICD-10-CM   1. Generalized anxiety disorder  F41.1       Plan: Patient is to use CBT and coping skills to decrease anxiety symptoms.  Patient is to do a brain dump at night and write as quickly as she can for a few minutes then close the journal.  Patient is to consider using brain spotting bilateral music to calm herself as needed.  Patient is to  continue working on pursuing different positive activities that she can do with her physical limitations to keep her brain engaged in positive.  Patient is to take medication as directed. Long term goal: Resolve the core conflict that is the source of anxiety Short term goal: Identify the major life conflicts from the past and present that form the basis for present anxiety  [name], [facility]

## 2021-11-05 ENCOUNTER — Telehealth: Payer: Self-pay | Admitting: Family Medicine

## 2021-11-05 NOTE — Telephone Encounter (Signed)
Pt called in stating she hit her head two hours ago and her vision is now blurry. She started asking about concussions. I transferred her over to Nurse Triage.

## 2021-11-05 NOTE — Telephone Encounter (Signed)
Noted. Dm/cma  

## 2021-11-18 ENCOUNTER — Telehealth: Payer: Self-pay | Admitting: Family Medicine

## 2021-11-18 ENCOUNTER — Ambulatory Visit: Payer: 59 | Admitting: Psychiatry

## 2021-11-18 ENCOUNTER — Ambulatory Visit: Payer: 59 | Admitting: Family Medicine

## 2021-11-18 DIAGNOSIS — F411 Generalized anxiety disorder: Secondary | ICD-10-CM

## 2021-11-18 NOTE — Progress Notes (Signed)
Patient did not connect or answer phone calls ?

## 2021-11-18 NOTE — Telephone Encounter (Signed)
Patient/Caregiver was notified of No Show/Late Cancellation Policy & possible $51 charge. ?Visit was cancelled with reason "No Show/Cancel within 24 hours" for tracking & charging. ? ?Caller Name: tristan bramble ?Caller Ph #: [phone] ?Date of APPT: 11/18/21 ?Reason given for no show/late cancellation: she has to work today ?No Show Letter printed & put in outgoing mail (Yes/No): yes ? 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 ? ? ?

## 2021-11-19 ENCOUNTER — Telehealth: Payer: Self-pay | Admitting: Psychiatry

## 2021-11-19 ENCOUNTER — Ambulatory Visit (HOSPITAL_COMMUNITY)
Admission: RE | Admit: 2021-11-19 | Discharge: 2021-11-19 | Disposition: A | Discharge status: Home / Self Care | Payer: 59 | Attending: Psychiatry | Admitting: Psychiatry

## 2021-11-19 ENCOUNTER — Ambulatory Visit: Payer: 59 | Admitting: Psychiatry
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
## 2021-11-19 NOTE — Telephone Encounter (Signed)
Called patient's mother to confirm she had gone for an assessment due to +SI.  She reported that she had taken her for an assessment but she was not admitted.  She reported that she was still concerned about patient.  Discussed what she could due in the future if she felt patient was in immediate danger of self harm. ?

## 2021-11-19 NOTE — Telephone Encounter (Signed)
Returned patient's call.  She reported she was having +SI.  Her parents were with her so asked to talk with her mother.  Gave her the information for [facility] and the Behavioral health Urgent care for [facility].  She agreed to make sure that patient went for an assessment. ?

## 2021-11-20 ENCOUNTER — Telehealth: Payer: Self-pay | Admitting: Psychiatry

## 2021-11-20 NOTE — Telephone Encounter (Signed)
Talked to patient to make sure she was safe.  She shared that she was safe and she would consider the virtual IOP. ?

## 2021-11-25 ENCOUNTER — Other Ambulatory Visit: Payer: Self-pay

## 2021-11-25 ENCOUNTER — Ambulatory Visit (INDEPENDENT_AMBULATORY_CARE_PROVIDER_SITE_OTHER): Payer: 59 | Admitting: Behavioral Health

## 2021-11-25 ENCOUNTER — Encounter: Payer: Self-pay | Admitting: Behavioral Health

## 2021-11-25 ENCOUNTER — Other Ambulatory Visit: Payer: Self-pay | Admitting: Behavioral Health

## 2021-11-25 ENCOUNTER — Other Ambulatory Visit: Payer: Self-pay | Admitting: Family

## 2021-11-25 DIAGNOSIS — F411 Generalized anxiety disorder: Secondary | ICD-10-CM | POA: Diagnosis not present

## 2021-11-25 DIAGNOSIS — F331 Major depressive disorder, recurrent, moderate: Secondary | ICD-10-CM

## 2021-11-25 MED ORDER — CARIPRAZINE HCL 1.5 MG PO CAPS: 1.5000 mg | ORAL_CAPSULE | Freq: Every day | ORAL | 1 refills | Status: DC

## 2021-11-25 MED ORDER — ESCITALOPRAM OXALATE 20 MG PO TABS: 20.0000 mg | ORAL_TABLET | Freq: Every day | ORAL | 2 refills | Status: DC

## 2021-11-25 MED ORDER — AUVELITY 45-105 MG PO TBCR: 1.0000 | EXTENDED_RELEASE_TABLET | Freq: Two times a day (BID) | ORAL | 3 refills | Status: DC

## 2021-11-25 NOTE — Progress Notes (Signed)
[facility] ? ?Patient ID: Helmut Muster,  ?MRN: [id number] ? ?PCP: Haydee Salter, MD ? ?Date of Evaluation: 11/25/2021 ?Time spent:30 minutes ? ?Chief Complaint:  ?Chief Complaint   ?Anxiety; Depression; Follow-up; Medication Refill; Medication Problem ?  ? ? ?HISTORY/CURRENT STATUS: ?HPI ? ?41 year old female presents to this office for follow up and medication management. She says that she feels like Auvelity has helped when she first started taking it but she feels depressed again and flat.  She think it still works some but not good enough. She would like to consider adding another medication to help with her lingering depression. She is complaining of continued lack of motivation and fatigue.  She says she wants to try taking 1/2 tablet of Trazodone because she feels extra somnolent in the morning. She does not want to make adjustment to her medications this visit. She reports anxiety at 5/10 and depression at 5/10. She says she averages about 7 hours sleep per night. Denies mania, no psychosis. No SI/HI.  ?  ?  ?Previous medication trials: ?  ?Venlafaxine ?Wellbutrin  ?Paxil  ? ? ?Individual Medical History/ Review of Systems: Changes? :No  ? ?Allergies: Latex ? ?Current Medications:  ?Current Outpatient Medications:  ?  cariprazine (VRAYLAR) 1.5 MG capsule, Take 1 capsule (1.5 mg total) by mouth daily., Disp: 30 capsule, Rfl: 1 ?  albuterol (VENTOLIN HFA) 108 (90 Base) MCG/ACT inhaler, Inhale 2 puffs into the lungs every 6 (six) hours as needed (asthma attacks)., Disp: 8 g, Rfl: 1 ?  ALPRAZolam (XANAX) 0.5 MG tablet, Take 1 tablet (0.5 mg total) by mouth once as needed for up to 1 dose for anxiety (please take 30 min prior to MRI and may take additional tablet at the time of the apt. must have driver)., Disp: 2 tablet, Rfl: 0 ?  B-D 3CC LUER-LOK SYR 25GX1" 25G X 1" 3 ML MISC, SMARTSIG:Injection, Disp: , Rfl:  ?  cholecalciferol (VITAMIN D3) 25 MCG (1000 UNIT) tablet, Take 1,000 Units by mouth  daily., Disp: , Rfl:  ?  Cyanocobalamin 1000 MCG/ML KIT, Inject 1 mL as directed every 30 (thirty) days. Dispense with  1 cc syringe and 27 g 1 inch (or similar) needle, Disp: 3 kit, Rfl: 4 ?  Dextromethorphan-buPROPion ER (AUVELITY) 45-105 MG TBCR, Take 1 tablet by mouth 2 (two) times daily., Disp: 60 tablet, Rfl: 3 ?  EPINEPHrine 0.3 mg/0.3 mL IJ SOAJ injection, Inject 0.3 mg into the muscle once as needed for anaphylaxis., Disp: 1 each, Rfl: 0 ?  escitalopram (LEXAPRO) 20 MG tablet, Take 1 tablet (20 mg total) by mouth daily., Disp: 90 tablet, Rfl: 2 ?  folic acid (FOLVITE) 1 MG tablet, TAKE 1 TABLET(1 MG) BY MOUTH DAILY, Disp: 90 tablet, Rfl: 1 ?  furosemide (LASIX) 20 MG tablet, Take 1 tablet (20 mg total) by mouth daily., Disp: 90 tablet, Rfl: 3 ?  gabapentin (NEURONTIN) 600 MG tablet, Take 1 tablet (600 mg total) by mouth 3 (three) times daily., Disp: 90 tablet, Rfl: 11 ?  hydrOXYzine (ATARAX/VISTARIL) 25 MG tablet, Take 2 tablets (50 mg total) by mouth 2 (two) times daily as needed., Disp: 120 tablet, Rfl: 6 ?  MAGNESIUM PO, Take 1 tablet by mouth daily., Disp: , Rfl:  ?  midodrine (PROAMATINE) 5 MG tablet, Take by mouth., Disp: , Rfl:  ?  Naphazoline HCl (CLEAR EYES OP), Place 1 drop into both eyes 2 (two) times daily., Disp: , Rfl:  ?  pantoprazole (PROTONIX) 40  MG tablet, Take 1 tablet (40 mg total) by mouth 2 (two) times daily. Protonix ( pantoprazole ) 40 mg twice daily for 6 weeks, then reduce to 40 mg daily and slowly titrate off., Disp: 60 tablet, Rfl: 0 ?  potassium chloride SA (KLOR-CON) 20 MEQ tablet, Take 1 tablet (20 mEq total) by mouth daily., Disp: 30 tablet, Rfl: 0 ?  spironolactone (ALDACTONE) 50 MG tablet, TAKE 1 TABLET(50 MG) BY MOUTH EVERY MORNING, Disp: 90 tablet, Rfl: 0 ?  traZODone (DESYREL) 50 MG tablet, TAKE 1 TABLET(50 MG) BY MOUTH AT BEDTIME, Disp: 30 tablet, Rfl: 0 ?  VITAMIN A PO, Take 1 tablet by mouth daily., Disp: , Rfl:  ?  ZOLMitriptan (ZOMIG) 2.5 MG tablet, Take 1 tablet  (2.5 mg total) by mouth once for 1 dose. May repeat in 2 hours if headache persists or recurs., Disp: 20 tablet, Rfl: 3 ?Medication Side Effects: none ? ?Family Medical/ Social History: Changes? No ? ?MENTAL HEALTH EXAM: ? ?There were no vitals taken for this visit.There is no height or weight on file to calculate BMI.  ?General Appearance: Casual, Neat, and Well Groomed  ?Eye Contact:  Good  ?Speech:  Clear and Coherent and Pressured  ?Volume:  Normal  ?Mood:  Anxious and Depressed  ?Affect:  Congruent, Depressed, Flat, and Anxious  ?Thought Process:  Coherent  ?Orientation:  Full (Time, Place, and Person)  ?Thought Content: Logical   ?Suicidal Thoughts:  No  ?Homicidal Thoughts:  No  ?Memory:  WNL  ?Judgement:  Good  ?Insight:  Good  ?Psychomotor Activity:  Normal  ?Concentration:  Concentration: Fair  ?Recall:  Fair  ?Fund of Knowledge: Fair  ?Language: Good  ?Assets:  Desire for Improvement ?Resilience ?Social Support  ?ADL's:  Intact  ?Cognition: WNL  ?Prognosis:  Good  ? ? ?DIAGNOSES:  ?  ICD-10-CM   ?1. Major depressive disorder, recurrent episode, moderate (HCC)  F33.1 cariprazine (VRAYLAR) 1.5 MG capsule  ?  escitalopram (LEXAPRO) 20 MG tablet  ?  Dextromethorphan-buPROPion ER (AUVELITY) 45-105 MG TBCR  ?  ?2. Generalized anxiety disorder  F41.1 cariprazine (VRAYLAR) 1.5 MG capsule  ?  escitalopram (LEXAPRO) 20 MG tablet  ?  Dextromethorphan-buPROPion ER (AUVELITY) 45-105 MG TBCR  ?  ? ? ?Receiving Psychotherapy: Yes  ? ? ?RECOMMENDATIONS:  ?Greater than 50% of 30  min face to face time with patient was spent on counseling and coordination of care. We discussed her moderate improvements with depression, but she returned today looking flat and sad. She said she believes the Idelle Crouch is still working some but not as effective as it was. She would like to try an adjunctive medication to help. She understands that she needs to continue to f/u with  her MD for regular  monitoring of her liver.  ? Her most recent  Liver Function Panel 08/2021 had elevated AST and ALT. ?To continue Lexapro 20 mg daily ?To Start Vraylar 1.5 mg daily.  ?To continue Auvelity 90/210 daily ?Continue Trazodone 50 mg at bedtime. May take half tablet if to somnolent in the am.  ?To report side effects or worsening symptoms ?Will follow up in 24 week to reevaluate ?Educated on good sleep hygiene ?Provided emergency contact info ?Discussed potential metabolic side effects associated with atypical antipsychotics, as well as potential risk for movement side effects. Advised pt to contact office if movement side effects occur.   ?Reviewed PDMP ? ? ? ? ?[name], NP  ?

## 2021-11-25 NOTE — Telephone Encounter (Signed)
Has appt with Aaron Edelman today.  ?

## 2021-11-26 ENCOUNTER — Telehealth: Payer: Self-pay

## 2021-11-26 NOTE — Telephone Encounter (Signed)
Prior Authorization submitted and approved for VRAYLAR 1.5 MG effective 11/25/2021-11/26/2022 with [employer] [facility]#[id number] ?

## 2021-11-29 ENCOUNTER — Other Ambulatory Visit: Payer: Self-pay | Admitting: Behavioral Health

## 2021-11-29 DIAGNOSIS — F331 Major depressive disorder, recurrent, moderate: Secondary | ICD-10-CM

## 2021-11-29 DIAGNOSIS — F411 Generalized anxiety disorder: Secondary | ICD-10-CM

## 2021-12-01 NOTE — Telephone Encounter (Signed)
1st no show - fee waived - letter sent KO ?

## 2021-12-02 ENCOUNTER — Other Ambulatory Visit: Payer: Self-pay | Admitting: Behavioral Health

## 2021-12-02 DIAGNOSIS — F331 Major depressive disorder, recurrent, moderate: Secondary | ICD-10-CM

## 2021-12-02 DIAGNOSIS — F411 Generalized anxiety disorder: Secondary | ICD-10-CM

## 2021-12-09 ENCOUNTER — Ambulatory Visit: Payer: 59 | Admitting: Psychiatry

## 2021-12-09 ENCOUNTER — Ambulatory Visit (INDEPENDENT_AMBULATORY_CARE_PROVIDER_SITE_OTHER): Payer: 59 | Admitting: Family Medicine

## 2021-12-09 VITALS — BP 106/70 | HR 97 | Temp 97.6°F | Ht 67.0 in | Wt 161.0 lb

## 2021-12-09 DIAGNOSIS — K7030 Alcoholic cirrhosis of liver without ascites: Secondary | ICD-10-CM

## 2021-12-09 DIAGNOSIS — E049 Nontoxic goiter, unspecified: Secondary | ICD-10-CM | POA: Diagnosis not present

## 2021-12-09 DIAGNOSIS — F331 Major depressive disorder, recurrent, moderate: Secondary | ICD-10-CM

## 2021-12-09 DIAGNOSIS — G629 Polyneuropathy, unspecified: Secondary | ICD-10-CM

## 2021-12-09 DIAGNOSIS — F1021 Alcohol dependence, in remission: Secondary | ICD-10-CM

## 2021-12-09 DIAGNOSIS — Z23 Encounter for immunization: Secondary | ICD-10-CM

## 2021-12-09 LAB — COMPREHENSIVE METABOLIC PANEL
ALT: 65 U/L — ABNORMAL HIGH (ref 0–35)
AST: 124 U/L — ABNORMAL HIGH (ref 0–37)
Albumin: 4.6 g/dL (ref 3.5–5.2)
Alkaline Phosphatase: 294 U/L — ABNORMAL HIGH (ref 39–117)
BUN: 6 mg/dL (ref 6–23)
CO2: 25 mEq/L (ref 19–32)
Calcium: 9.2 mg/dL (ref 8.4–10.5)
Chloride: 104 mEq/L (ref 96–112)
Creatinine, Ser: 0.89 mg/dL (ref 0.40–1.20)
GFR: 81.67 mL/min (ref >60.00)
Glucose, Bld: 109 mg/dL — ABNORMAL HIGH (ref 70–99)
Potassium: 3.8 mEq/L (ref 3.5–5.1)
Sodium: 143 mEq/L (ref 135–145)
Total Bilirubin: 1.2 mg/dL (ref 0.2–1.2)
Total Protein: 7.7 g/dL (ref 6.0–8.3)

## 2021-12-09 LAB — CBC
HCT: 39.1 % (ref 36.0–46.0)
Hemoglobin: 13.2 g/dL (ref 12.0–15.0)
MCHC: 33.8 g/dL (ref 30.0–36.0)
MCV: 96.5 fl (ref 78.0–100.0)
Platelets: 232 10*3/uL (ref 150.0–400.0)
RBC: 4.05 Mil/uL (ref 3.87–5.11)
RDW: 16 % — ABNORMAL HIGH (ref 11.5–15.5)
WBC: 4.9 10*3/uL (ref 4.0–10.5)

## 2021-12-09 NOTE — Progress Notes (Signed)
?[facility] ?[facility] ?[address] ?[place] [address] ?Dept: [phone] ?Dept Fax: [phone] ? ?Chronic Care Office Visit ? ?Subjective:  ? ? Patient ID: Jeanette Elliott, female    DOB: March 04, 1981, 41 y.o..   MRN: [id number] ? ?Chief Complaint  ?Patient presents with  ? Follow-up  ?  3 month f/u., fasting today.  Questions about Gabapentin.    ? ? ?History of Present Illness: ? ?Patient is in today for reassessment of chronic medical issues. ? ?Ms. Bickert has a history of alcohol dependence. She remains in remission at this point, maintaining her sobriety. Her alcoholism had been complicated by cirrhosis. However, she has had marked improvement in her hepatic and hematologic abnormalities. She has returned to work at [employer] She finds the work physically demanding, but is glad to be around people more. ?  ?Ms. Krempasky sees Dr. Felecia Shelling (neurology) related to a peripheral neuropathy. 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 He is managing her with gabapentin for neuropathic pain and had her increase her dose at her appointment with her in January. She is still noticing some significant discomfort. She also feels that this contributes to her depression. Her therapist had asked about her current liver function, feeling she stop her gabapentin and consider starting amitriptyline, feeling this could help her insomnia and her neuropathy both.  ?  ?Ms. Whitaker notes a feeling of choking in her neck. She notes she has a past history of a goiter. She has had prior ultrasounds and had thyroid testing, but apparently did not need anything done for this. She finds the sensaiton when she swallows to be quite bothersome. ? ?Past Medical History: ?Patient Active Problem List  ? Diagnosis Date Noted  ? Goiter 12/09/2021  ? Cholelithiasis 09/14/2021  ? Peripheral vascular disease ([place]) 09/09/2021  ? Osteopenia determined by x-ray 09/09/2021  ? Hip  impingement syndrome, left 06/18/2021  ? Sacroiliac joint dysfunction of right side 06/18/2021  ? B12 deficiency 05/06/2021  ? Normocytic anemia 05/06/2021  ? Neuropathy 05/05/2021  ? Duodenal ulcer 05/05/2021  ? Asthma 05/05/2021  ? Hymenoptera allergy 05/05/2021  ? Alcohol dependence in remission ([place]) 03/04/2021  ? Anxiety   ? Cirrhosis ([place]) 02/02/2021  ? S/P thoracentesis   ? Closed fracture of rib of left side with routine healing   ? Fall   ? Dysplasia of cervix, low grade (CIN 1) 09/22/2016  ? 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  ? Sjogren's syndrome ([place]) 07/20/2016  ? Major depressive disorder, recurrent episode ([place]) 05/16/2013  ? ?Past Surgical History:  ?Procedure Laterality Date  ? IR PARACENTESIS  02/03/2021  ? IR THORACENTESIS ASP PLEURAL SPACE W/IMG GUIDE  01/16/2021  ? ORIF FINGER / THUMB FRACTURE Left   ? Thumb  ? SUBMANDIBULAR GLAND EXCISION    ? TONSILLECTOMY AND ADENOIDECTOMY    ? ?Family History  ?Problem Relation Age of Onset  ? Arthritis Mother   ? Heart disease Father   ? Cancer Father   ?     Prostate  ? Arthritis Father   ? Heart disease Maternal Grandmother   ? Stroke Maternal Grandmother   ? Depression Maternal Grandmother   ? Alzheimer's disease Maternal Grandfather   ? Heart disease Paternal Grandfather   ? Heart disease Paternal Uncle   ? Colon cancer Neg Hx   ? Esophageal cancer Neg Hx   ? ?Outpatient Medications Prior to Visit  ?Medication Sig Dispense Refill  ? albuterol (VENTOLIN HFA) 108 (  90 Base) MCG/ACT inhaler Inhale 2 puffs into the lungs every 6 (six) hours as needed (asthma attacks). 8 g 1  ? B-D 3CC LUER-LOK SYR 25GX1" 25G X 1" 3 ML MISC SMARTSIG:Injection    ? cariprazine (VRAYLAR) 1.5 MG capsule Take 1 capsule (1.5 mg total) by mouth daily. 30 capsule 1  ? cholecalciferol (VITAMIN D3) 25 MCG (1000 UNIT) tablet Take 1,000 Units by mouth daily.    ? Cyanocobalamin 1000 MCG/ML KIT Inject 1 mL as directed every 30 (thirty) days. Dispense with  1 cc syringe and  27 g 1 inch (or similar) needle 3 kit 4  ? Dextromethorphan-buPROPion ER (AUVELITY) 45-105 MG TBCR Take 1 tablet by mouth 2 (two) times daily. 60 tablet 3  ? EPINEPHrine 0.3 mg/0.3 mL IJ SOAJ injection Inject 0.3 mg into the muscle once as needed for anaphylaxis. 1 each 0  ? escitalopram (LEXAPRO) 20 MG tablet Take 1 tablet (20 mg total) by mouth daily. 90 tablet 2  ? folic acid (FOLVITE) 1 MG tablet TAKE 1 TABLET(1 MG) BY MOUTH DAILY 90 tablet 1  ? furosemide (LASIX) 20 MG tablet Take 1 tablet (20 mg total) by mouth daily. 90 tablet 3  ? hydrOXYzine (ATARAX/VISTARIL) 25 MG tablet Take 2 tablets (50 mg total) by mouth 2 (two) times daily as needed. 120 tablet 6  ? MAGNESIUM PO Take 1 tablet by mouth daily.    ? Naphazoline HCl (CLEAR EYES OP) Place 1 drop into both eyes 2 (two) times daily.    ? potassium chloride SA (KLOR-CON) 20 MEQ tablet Take 1 tablet (20 mEq total) by mouth daily. 30 tablet 0  ? spironolactone (ALDACTONE) 50 MG tablet TAKE 1 TABLET(50 MG) BY MOUTH EVERY MORNING 90 tablet 3  ? traZODone (DESYREL) 50 MG tablet TAKE 1 TABLET(50 MG) BY MOUTH AT BEDTIME 30 tablet 0  ? VITAMIN A PO Take 1 tablet by mouth daily.    ? ZOLMitriptan (ZOMIG) 2.5 MG tablet Take 1 tablet (2.5 mg total) by mouth once for 1 dose. May repeat in 2 hours if headache persists or recurs. 20 tablet 3  ? gabapentin (NEURONTIN) 600 MG tablet Take 1 tablet (600 mg total) by mouth 3 (three) times daily. 90 tablet 11  ? pantoprazole (PROTONIX) 40 MG tablet Take 1 tablet (40 mg total) by mouth 2 (two) times daily. Protonix ( pantoprazole ) 40 mg twice daily for 6 weeks, then reduce to 40 mg daily and slowly titrate off. 60 tablet 0  ? ALPRAZolam (XANAX) 0.5 MG tablet Take 1 tablet (0.5 mg total) by mouth once as needed for up to 1 dose for anxiety (please take 30 min prior to MRI and may take additional tablet at the time of the apt. must have driver). (Patient not taking: Reported on 12/09/2021) 2 tablet 0  ? midodrine (PROAMATINE) 5 MG  tablet Take by mouth.    ? ?No facility-administered medications prior to visit.  ? ?Allergies  ?Allergen Reactions  ? Latex Itching and Rash  ?   ?Objective:  ? ?Today's Vitals  ? 12/09/21 1005  ?BP: 106/70  ?Pulse: 97  ?Temp: 97.6 ?F (36.4 ?C)  ?TempSrc: Temporal  ?SpO2: 95%  ?Weight: 161 lb (73 kg)  ?Height: 5' 7"  (1.702 m)  ? ?Body mass index is 25.22 kg/m?.  ? ?General: Well developed, well nourished. No acute distress. ?Psych: Alert and oriented. Normal mood and affect. ? ?Health Maintenance Due  ?Topic Date Due  ? TETANUS/TDAP  Never done  ?  PAP SMEAR-Modifier  Never done  ?   ?Imaging: ?MR Cervical Spine wo contrast (10/07/2021) ?IMPRESSION: This MRI of the cervical spine without contrast shows the following: ?1.  The spinal cord appears normal. ?2.  Mild disc degenerative changes at C3-C4, C5-C6 and C6-C7 that do not lead to spinal stenosis or nerve root compression. ? ?US Abdomen Limited RUQ (09/10/2021) ?IMPRESSION: ?1. Heterogeneous echogenic enlarged liver without evidence for hepatic mass lesion. ?2. Contracted gallbladder with stones. Slight increased gallbladder wall thickness but negative sonographic Percell Miller is nonspecific. Some of the gallbladder wall thickening is felt related to contracted appearance of the gallbladder. ? ?US Thyroid (04/09/2021) ?IMPRESSION: ?Benign subcentimeter thyroid cystic nodules, all 8 mm or less in size. No significant finding that warrants follow-up or biopsy. ? ?Assessment & Plan:  ? ?1. Alcohol dependence in remission [facility]) ?Ms. Cubillos is remaining sober, though she admits it has been a struggle at times related to her depression. She continues to remain engaged in counseling. Her return to work is a positive and I encouraged her to continue on her current path. ? ?2. Alcoholic cirrhosis of liver without ascites ([place]) ?Last labs showed some improvement. I will repeat these, as there was a question about whether she would tolerate amitriptyline. ? ?- Comprehensive  metabolic panel ?- CBC ? ?3. Goiter ?Ms. Brethauer has mild, diffuse enlargement. As she finds a sense of pressure on her swallowing, I will refer her to an ENT to evaluate. ? ?4. Moderate episode of recurrent major

## 2021-12-09 NOTE — Patient Instructions (Signed)
Taper gabapentin. Take 300 mg twice a day for 4 days, then 300 mg once a day for 4 days, then stop. ?

## 2021-12-10 ENCOUNTER — Telehealth: Payer: Self-pay | Admitting: Family Medicine

## 2021-12-10 NOTE — Telephone Encounter (Signed)
Pt called about her referral for her thyroid. She was more relieved it had been sent to a provider. She also saw her labs and would like a call to discuss the results. ?

## 2021-12-11 NOTE — Telephone Encounter (Signed)
Spoke to patient and she will print labs from home to give to her other doctor.   Advised that referral was placed on 12/09/21. Dm/cma ? ?

## 2021-12-15 ENCOUNTER — Telehealth: Payer: 59 | Admitting: Family Medicine

## 2021-12-15 NOTE — Telephone Encounter (Signed)
Graylon Good from [facility] [phone] called thought the referral was supposed to be made at Dr. Deeann Saint office at [place]. Please let me know if I need to send this to [facility] also. ?

## 2021-12-15 NOTE — Telephone Encounter (Signed)
Spoke to [facility] to advise that they are the only ones in network. They will call back if any questions. Dm/cma ? ?

## 2021-12-17 ENCOUNTER — Inpatient Hospital Stay (HOSPITAL_COMMUNITY)
Admission: RE | Admit: 2021-12-17 | Discharge: 2021-12-19 | DRG: 885 | Disposition: A | Discharge status: Home / Self Care | Payer: 59 | Attending: Psychiatry | Admitting: Psychiatry

## 2021-12-17 ENCOUNTER — Telehealth: Payer: Self-pay | Admitting: Psychiatry

## 2021-12-17 ENCOUNTER — Encounter (HOSPITAL_COMMUNITY): Payer: Self-pay | Admitting: Psychiatry

## 2021-12-17 ENCOUNTER — Other Ambulatory Visit: Payer: Self-pay

## 2021-12-17 DIAGNOSIS — Z823 Family history of stroke: Secondary | ICD-10-CM

## 2021-12-17 DIAGNOSIS — K7030 Alcoholic cirrhosis of liver without ascites: Secondary | ICD-10-CM | POA: Diagnosis present

## 2021-12-17 DIAGNOSIS — Z20822 Contact with and (suspected) exposure to covid-19: Secondary | ICD-10-CM | POA: Diagnosis present

## 2021-12-17 DIAGNOSIS — Z9151 Personal history of suicidal behavior: Secondary | ICD-10-CM

## 2021-12-17 DIAGNOSIS — F339 Major depressive disorder, recurrent, unspecified: Secondary | ICD-10-CM | POA: Diagnosis present

## 2021-12-17 DIAGNOSIS — F333 Major depressive disorder, recurrent, severe with psychotic symptoms: Secondary | ICD-10-CM | POA: Diagnosis present

## 2021-12-17 DIAGNOSIS — G4700 Insomnia, unspecified: Secondary | ICD-10-CM | POA: Diagnosis present

## 2021-12-17 DIAGNOSIS — D649 Anemia, unspecified: Secondary | ICD-10-CM | POA: Diagnosis present

## 2021-12-17 DIAGNOSIS — I739 Peripheral vascular disease, unspecified: Secondary | ICD-10-CM

## 2021-12-17 DIAGNOSIS — F419 Anxiety disorder, unspecified: Secondary | ICD-10-CM | POA: Diagnosis present

## 2021-12-17 DIAGNOSIS — F1021 Alcohol dependence, in remission: Secondary | ICD-10-CM | POA: Insufficient documentation

## 2021-12-17 DIAGNOSIS — Z9152 Personal history of nonsuicidal self-harm: Secondary | ICD-10-CM | POA: Diagnosis not present

## 2021-12-17 DIAGNOSIS — Z818 Family history of other mental and behavioral disorders: Secondary | ICD-10-CM | POA: Diagnosis not present

## 2021-12-17 DIAGNOSIS — Z8249 Family history of ischemic heart disease and other diseases of the circulatory system: Secondary | ICD-10-CM | POA: Diagnosis not present

## 2021-12-17 DIAGNOSIS — G621 Alcoholic polyneuropathy: Secondary | ICD-10-CM | POA: Diagnosis present

## 2021-12-17 DIAGNOSIS — Z91040 Latex allergy status: Secondary | ICD-10-CM

## 2021-12-17 DIAGNOSIS — R45851 Suicidal ideations: Secondary | ICD-10-CM | POA: Diagnosis not present

## 2021-12-17 DIAGNOSIS — M3500 Sicca syndrome, unspecified: Secondary | ICD-10-CM | POA: Diagnosis present

## 2021-12-17 DIAGNOSIS — F1020 Alcohol dependence, uncomplicated: Secondary | ICD-10-CM | POA: Diagnosis present

## 2021-12-17 DIAGNOSIS — E559 Vitamin D deficiency, unspecified: Secondary | ICD-10-CM | POA: Diagnosis present

## 2021-12-17 DIAGNOSIS — Z820 Family history of epilepsy and other diseases of the nervous system: Secondary | ICD-10-CM

## 2021-12-17 LAB — RESP PANEL BY RT-PCR (FLU A&B, COVID) ARPGX2
Influenza A by PCR: NEGATIVE
Influenza B by PCR: NEGATIVE
SARS Coronavirus 2 by RT PCR: NEGATIVE

## 2021-12-17 MED ORDER — TRAZODONE HCL 50 MG PO TABS
50.0000 mg | ORAL_TABLET | Freq: Every evening | ORAL | Status: DC | PRN
  Administered 2021-12-17: 50 mg via ORAL
  Filled 2021-12-17: qty 1

## 2021-12-17 MED ORDER — ACETAMINOPHEN 325 MG PO TABS: 650.0000 mg | ORAL_TABLET | Freq: Four times a day (QID) | ORAL | Status: DC | PRN

## 2021-12-17 MED ORDER — HYDROXYZINE HCL 25 MG PO TABS
25.0000 mg | ORAL_TABLET | Freq: Three times a day (TID) | ORAL | Status: DC | PRN
Start: 2021-12-17 — End: 2021-12-18
  Administered 2021-12-17 – 2021-12-18 (×2): 25 mg via ORAL
  Filled 2021-12-17 (×2): qty 1

## 2021-12-17 MED ORDER — MAGNESIUM HYDROXIDE 400 MG/5ML PO SUSP: 30.0000 mL | Freq: Every day | ORAL | Status: DC | PRN

## 2021-12-17 MED ORDER — ALUM & MAG HYDROXIDE-SIMETH 200-200-20 MG/5ML PO SUSP: 30.0000 mL | ORAL | Status: DC | PRN
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
## 2021-12-17 NOTE — Progress Notes (Signed)
?   12/17/21 1600  ?Psych Admission Type (Psych Patients Only)  ?Admission Status Voluntary  ?Psychosocial Assessment  ?Patient Complaints Agitation;Anger;Anxiety;Confusion;Crying spells;Depression;Hopelessness;Irritability;Insomnia;Loneliness;Nervousness;Panic attack;Restlessness;Sadness  ?Eye Contact Brief  ?Facial Expression Anxious;Pained;Sad  ?Affect Anxious;Depressed  ?Speech Logical/coherent  ?Interaction Assertive  ?Motor Activity Fidgety  ?Appearance/Hygiene Disheveled  ?Behavior Characteristics Anxious;Guarded  ?Mood Depressed;Anxious;Suspicious;Angry;Despair  ?Thought Process  ?Coherency WDL  ?Content Blaming others  ?Delusions None reported or observed  ?Perception WDL  ?Hallucination None reported or observed  ?Judgment Poor  ?Confusion WDL  ?Danger to Self  ?Current suicidal ideation? Denies  ?Danger to Others  ?Danger to Others None reported or observed  ? ?Initial Nursing Assessment ? ?Patient is a 41 y.o. Caucasian female voluntarily walked in escorted by her mother and father to [facility] for Chronic and acute depression, anxiety and SI. Pt. Stated "I'm really sad" "I didn't want to be here [pt. 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  Pt. Reported that she has been dx by a psychiatrist with a borderline personality disorder, anxiety and depression. Pt. Reported she has had daily passive thoughts of suicide since being in middle school. Pt. Reported one SUA about 5 years ago (triggered by a breakup); Pt OD on over 50 Tramadol, plus "any extra pills and alcohol" Pt. Stated that she did not go to the ED for this but just "woke up the next morning" this OD attempt did put her in liver failure and she was put on the liver transplant list and followed by [facility] hospital. Pt. Was recently taken off the transplant list because "my liver values improved"   ? ?Pt. Reports that she is on many medications for her liver, neuropathy,anxiety and depression. Pt. Believes that the Gabapentin that she is taking for her neuropathy is increasing her depression and SI. Pt. Works in a [employer] and has multiple scratches on both hands. Pt. Refused a full skin check but did let us do a modified skin check. Pt.'s clothing including pockets were checked. Management was notified. Pt. Reports an allergy to latex. Pt. Also reports daily drinking "a shot a day" and that her parents have wanted her to quit drinking.  ? ?Pt. Lives in a house by herself and plans on returning there when she discharges. Pt.'s parents were supportive of pt. Pt. Contracts for safety. ? ?

## 2021-12-17 NOTE — Telephone Encounter (Signed)
Pt's mom called with pt in the background. Mom said they are taking her to the behavioral health hospital on [address] and they really need to speak to you. I told them you were with patients but I would send you the message.  Call them back on [phone] or [phone] ? ?Sent this to you earlier as an instant message but documenting it here for records. ?

## 2021-12-17 NOTE — Tx Team (Signed)
Initial Treatment Plan ?12/17/2021 ?4:55 PM ?Helmut Muster ?[id number] ? ? ? ?PATIENT STRESSORS: ?Financial difficulties   ?Health problems   ? ? ?PATIENT STRENGTHS: ?Ability for insight  ?Average or above average intelligence  ?Capable of independent living  ?Communication skills  ?Financial means  ?General fund of knowledge  ?Motivation for treatment/growth  ?Supportive family/friends  ?Work skills  ? ? ?PATIENT IDENTIFIED PROBLEMS: ?SI  ?Anxiety  ?depression  ?  ?  ?  ?  ?  ?  ?  ? ?DISCHARGE CRITERIA:  ?Ability to meet basic life and health needs ?Adequate post-discharge living arrangements ?Improved stabilization in mood, thinking, and/or behavior ?Medical problems require only outpatient monitoring ?Motivation to continue treatment in a less acute level of care ? ?PRELIMINARY DISCHARGE PLAN: ?Outpatient therapy ?Participate in family therapy ?Return to previous living arrangement ? ?PATIENT/FAMILY INVOLVEMENT: ?This treatment plan has been presented to and reviewed with the patient, Cynara Tatham.  The patient has been given the opportunity to ask questions and make suggestions. ? ?Roxanna Mew, RN ?12/17/2021, 4:55 PM ?

## 2021-12-17 NOTE — Telephone Encounter (Signed)
Returned mother's call and she shared that patient was admitted to the inpatient. ?

## 2021-12-18 ENCOUNTER — Encounter (HOSPITAL_COMMUNITY): Payer: Self-pay

## 2021-12-18 DIAGNOSIS — R45851 Suicidal ideations: Secondary | ICD-10-CM

## 2021-12-18 LAB — LIPID PANEL
Cholesterol: 295 mg/dL — ABNORMAL HIGH (ref 0–200)
HDL: 127 mg/dL (ref >40)
LDL Cholesterol: 149 mg/dL — ABNORMAL HIGH (ref 0–99)
Total CHOL/HDL Ratio: 2.3 RATIO
Triglycerides: 93 mg/dL (ref <150)
VLDL: 19 mg/dL (ref 0–40)

## 2021-12-18 LAB — BILIRUBIN, DIRECT: Bilirubin, Direct: 0.8 mg/dL — ABNORMAL HIGH (ref 0.0–0.2)

## 2021-12-18 LAB — COMPREHENSIVE METABOLIC PANEL
ALT: 117 U/L — ABNORMAL HIGH (ref 0–44)
AST: 234 U/L — ABNORMAL HIGH (ref 15–41)
Albumin: 4.1 g/dL (ref 3.5–5.0)
Alkaline Phosphatase: 339 U/L — ABNORMAL HIGH (ref 38–126)
Anion gap: 14 (ref 5–15)
BUN: 9 mg/dL (ref 6–20)
CO2: 24 mmol/L (ref 22–32)
Calcium: 9.2 mg/dL (ref 8.9–10.3)
Chloride: 98 mmol/L (ref 98–111)
Creatinine, Ser: 0.9 mg/dL (ref 0.44–1.00)
GFR, Estimated: >60 mL/min (ref >60)
Glucose, Bld: 112 mg/dL — ABNORMAL HIGH (ref 70–99)
Potassium: 3 mmol/L — ABNORMAL LOW (ref 3.5–5.1)
Sodium: 136 mmol/L (ref 135–145)
Total Bilirubin: 2.7 mg/dL — ABNORMAL HIGH (ref 0.3–1.2)
Total Protein: 7.4 g/dL (ref 6.5–8.1)

## 2021-12-18 LAB — HEPATITIS PANEL, ACUTE
HCV Ab: NONREACTIVE
Hep A IgM: NONREACTIVE
Hep B C IgM: NONREACTIVE
Hepatitis B Surface Ag: NONREACTIVE

## 2021-12-18 LAB — HEMOGLOBIN A1C
Hgb A1c MFr Bld: 4.8 % (ref 4.8–5.6)
Mean Plasma Glucose: 91.06 mg/dL

## 2021-12-18 LAB — CBC
HCT: 36.3 % (ref 36.0–46.0)
Hemoglobin: 13 g/dL (ref 12.0–15.0)
MCH: 33.9 pg (ref 26.0–34.0)
MCHC: 35.8 g/dL (ref 30.0–36.0)
MCV: 94.8 fL (ref 80.0–100.0)
Platelets: 143 10*3/uL — ABNORMAL LOW (ref 150–400)
RBC: 3.83 MIL/uL — ABNORMAL LOW (ref 3.87–5.11)
RDW: 14.3 % (ref 11.5–15.5)
WBC: 5.1 10*3/uL (ref 4.0–10.5)
nRBC: 0 % (ref 0.0–0.2)

## 2021-12-18 LAB — ETHANOL: Alcohol, Ethyl (B): <10 mg/dL (ref <10)

## 2021-12-18 LAB — TSH: TSH: 3.279 u[IU]/mL (ref 0.350–4.500)

## 2021-12-18 MED ORDER — MIRTAZAPINE 7.5 MG PO TABS
7.5000 mg | ORAL_TABLET | Freq: Every day | ORAL | Status: DC
  Administered 2021-12-18: 7.5 mg via ORAL
  Filled 2021-12-18 (×4): qty 1

## 2021-12-18 MED ORDER — EPINEPHRINE 0.3 MG/0.3ML IJ SOAJ: 0.3000 mg | Freq: Once | INTRAMUSCULAR | Status: DC | PRN

## 2021-12-18 MED ORDER — POTASSIUM CHLORIDE CRYS ER 20 MEQ PO TBCR
40.0000 meq | EXTENDED_RELEASE_TABLET | Freq: Every day | ORAL | Status: DC
  Administered 2021-12-18 – 2021-12-19 (×2): 40 meq via ORAL
  Filled 2021-12-18 (×4): qty 2

## 2021-12-18 MED ORDER — LOPERAMIDE HCL 2 MG PO CAPS: 2.0000 mg | ORAL_CAPSULE | ORAL | Status: DC | PRN

## 2021-12-18 MED ORDER — ENSURE ENLIVE PO LIQD
237.0000 mL | ORAL | Status: DC
  Filled 2021-12-18 (×3): qty 237

## 2021-12-18 MED ORDER — MIRTAZAPINE 15 MG PO TABS: 7.5000 mg | ORAL_TABLET | Freq: Every evening | ORAL | Status: DC | PRN

## 2021-12-18 MED ORDER — CARIPRAZINE HCL 1.5 MG PO CAPS
1.5000 mg | ORAL_CAPSULE | Freq: Every day | ORAL | Status: DC
  Administered 2021-12-18 – 2021-12-19 (×2): 1.5 mg via ORAL
  Filled 2021-12-18 (×6): qty 1

## 2021-12-18 MED ORDER — DEXTROMETHORPHAN-BUPROPION ER 45-105 MG PO TBCR: 1.0000 | EXTENDED_RELEASE_TABLET | Freq: Two times a day (BID) | ORAL | Status: DC

## 2021-12-18 MED ORDER — SPIRONOLACTONE 25 MG PO TABS
25.0000 mg | ORAL_TABLET | Freq: Every day | ORAL | Status: DC
  Administered 2021-12-18 – 2021-12-19 (×2): 25 mg via ORAL
  Filled 2021-12-18 (×6): qty 1

## 2021-12-18 MED ORDER — SUCRALFATE 1 G PO TABS
1.0000 g | ORAL_TABLET | Freq: Three times a day (TID) | ORAL | Status: DC
  Administered 2021-12-18 – 2021-12-19 (×4): 1 g via ORAL
  Filled 2021-12-18 (×13): qty 1

## 2021-12-18 MED ORDER — HYDROXYZINE HCL 50 MG PO TABS: 50.0000 mg | ORAL_TABLET | Freq: Two times a day (BID) | ORAL | Status: DC | PRN

## 2021-12-18 MED ORDER — MELATONIN 5 MG PO TABS
5.0000 mg | ORAL_TABLET | Freq: Every day | ORAL | Status: DC
Start: 2021-12-18 — End: 2021-12-19
  Administered 2021-12-18: 5 mg via ORAL
  Filled 2021-12-18 (×4): qty 1

## 2021-12-18 MED ORDER — THIAMINE HCL 100 MG/ML IJ SOLN
100.0000 mg | Freq: Once | INTRAMUSCULAR | Status: AC
  Administered 2021-12-18: 100 mg via INTRAMUSCULAR
  Filled 2021-12-18: qty 2

## 2021-12-18 MED ORDER — ADULT MULTIVITAMIN W/MINERALS CH
1.0000 | ORAL_TABLET | Freq: Every day | ORAL | Status: DC
  Administered 2021-12-18: 1 via ORAL
  Filled 2021-12-18 (×3): qty 1

## 2021-12-18 MED ORDER — TRAZODONE HCL 50 MG PO TABS
50.0000 mg | ORAL_TABLET | Freq: Every evening | ORAL | Status: DC | PRN
  Administered 2021-12-18: 50 mg via ORAL
  Filled 2021-12-18: qty 1

## 2021-12-18 MED ORDER — LORAZEPAM 1 MG PO TABS
1.0000 mg | ORAL_TABLET | Freq: Three times a day (TID) | ORAL | Status: DC
  Administered 2021-12-19: 1 mg via ORAL
  Filled 2021-12-18 (×2): qty 1

## 2021-12-18 MED ORDER — THIAMINE HCL 100 MG PO TABS
100.0000 mg | ORAL_TABLET | Freq: Every day | ORAL | Status: DC
  Administered 2021-12-19: 100 mg via ORAL
  Filled 2021-12-18 (×3): qty 1

## 2021-12-18 MED ORDER — RENA-VITE PO TABS
1.0000 | ORAL_TABLET | Freq: Every day | ORAL | Status: DC
  Filled 2021-12-18 (×2): qty 1

## 2021-12-18 MED ORDER — ADULT MULTIVITAMIN W/MINERALS CH
1.0000 | ORAL_TABLET | Freq: Every day | ORAL | Status: DC
  Filled 2021-12-18 (×2): qty 1

## 2021-12-18 MED ORDER — FUROSEMIDE 20 MG PO TABS
20.0000 mg | ORAL_TABLET | Freq: Every day | ORAL | Status: DC
  Administered 2021-12-18 – 2021-12-19 (×2): 20 mg via ORAL
  Filled 2021-12-18 (×6): qty 1

## 2021-12-18 MED ORDER — LORAZEPAM 1 MG PO TABS: 1.0000 mg | ORAL_TABLET | Freq: Two times a day (BID) | ORAL | Status: DC

## 2021-12-18 MED ORDER — LORAZEPAM 1 MG PO TABS
1.0000 mg | ORAL_TABLET | Freq: Four times a day (QID) | ORAL | Status: AC
  Administered 2021-12-18 (×4): 1 mg via ORAL
  Filled 2021-12-18 (×2): qty 1

## 2021-12-18 MED ORDER — FOLIC ACID 1 MG PO TABS
1.0000 mg | ORAL_TABLET | Freq: Every day | ORAL | Status: DC
  Filled 2021-12-18 (×2): qty 1

## 2021-12-18 MED ORDER — ESCITALOPRAM OXALATE 10 MG PO TABS
20.0000 mg | ORAL_TABLET | Freq: Every day | ORAL | Status: DC
  Administered 2021-12-18 – 2021-12-19 (×2): 20 mg via ORAL
  Filled 2021-12-18: qty 1
  Filled 2021-12-18: qty 2
  Filled 2021-12-18: qty 1
  Filled 2021-12-18 (×2): qty 2

## 2021-12-18 MED ORDER — PREGABALIN 25 MG PO CAPS
25.0000 mg | ORAL_CAPSULE | Freq: Two times a day (BID) | ORAL | Status: DC
  Administered 2021-12-18 – 2021-12-19 (×3): 25 mg via ORAL
  Filled 2021-12-18 (×3): qty 1

## 2021-12-18 MED ORDER — ALBUTEROL SULFATE (2.5 MG/3ML) 0.083% IN NEBU: 3.0000 mL | INHALATION_SOLUTION | Freq: Four times a day (QID) | RESPIRATORY_TRACT | Status: DC | PRN

## 2021-12-18 MED ORDER — ONDANSETRON 4 MG PO TBDP: 4.0000 mg | ORAL_TABLET | Freq: Four times a day (QID) | ORAL | Status: DC | PRN

## 2021-12-18 MED ORDER — LORAZEPAM 1 MG PO TABS: 1.0000 mg | ORAL_TABLET | Freq: Every day | ORAL | Status: DC

## 2021-12-18 MED ORDER — LORAZEPAM 1 MG PO TABS
1.0000 mg | ORAL_TABLET | Freq: Four times a day (QID) | ORAL | Status: DC | PRN
  Filled 2021-12-18: qty 1

## 2021-12-18 MED ORDER — VITAMIN D3 25 MCG PO TABS
1000.0000 [IU] | ORAL_TABLET | Freq: Every day | ORAL | Status: DC
  Administered 2021-12-18 – 2021-12-19 (×2): 1000 [IU] via ORAL
  Filled 2021-12-18 (×6): qty 1

## 2021-12-18 MED ORDER — VITAMIN D3 25 MCG PO TABS
1000.0000 [IU] | ORAL_TABLET | Freq: Every day | ORAL | Status: DC
  Filled 2021-12-18 (×2): qty 1

## 2021-12-18 NOTE — Group Note (Signed)
Recreation Therapy Group Note ? ? ?Group Topic:Stress Management  ?Group Date: 12/18/2021 ?Start Time: 1110 ?End Time: 1120 ?Facilitators: Victorino Sparrow, LRT,CTRS ?Location:  [address] ? ? ?Activity Description: LRT provided pt a workbook reviewing stress management concepts, identifying triggers to stress and identifying things in and out of their control. Pt given the option to complete the packet in their room at their own pace.  ? ? ?Participation Quality: Independent ?  ? ?Clinical Observations/Individualized Feedback:  Due to illness on unit, packets were given dealing with stress management for patients to complete.  ?  ? ?Plan: Continue to engage patient in RT group sessions 2-3x/week. ? ? ?Victorino Sparrow, LRT,CTRS ?12/18/2021 12:02 PM ?
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
## 2021-12-18 NOTE — Progress Notes (Signed)
?   12/17/21 2204  ?Psych Admission Type (Psych Patients Only)  ?Admission Status Voluntary  ?Psychosocial Assessment  ?Patient Complaints Anxiety;Agitation  ?Eye Contact Brief  ?Facial Expression Anxious  ?Affect Appropriate to circumstance;Anxious  ?Speech Logical/coherent  ?Interaction Assertive  ?Motor Activity Restless;Fidgety  ?Appearance/Hygiene Disheveled  ?Behavior Characteristics Anxious  ?Mood Anxious  ?Thought Process  ?Coherency WDL  ?Content Blaming others  ?Delusions None reported or observed  ?Perception WDL  ?Hallucination None reported or observed  ?Judgment Poor  ?Confusion None  ?Danger to Self  ?Current suicidal ideation? Denies  ?Danger to Others  ?Danger to Others None reported or observed  ? ? ?

## 2021-12-18 NOTE — Progress Notes (Signed)
Patient is pleasant on approach, alert, and oriented during shift assessment. Patient is compliant with routine medication, tolerated them well with no side effect noted. Patient denies SI/HI, and AVH, but patient endorses anxiety. Patient denies pain or any discomfort, no acute distress noted at this time.  Q 15 minutes safety observation in place. Staff will continue to monitor.  ? 12/18/21 0800  ?Psych Admission Type (Psych Patients Only)  ?Admission Status Voluntary  ?Psychosocial Assessment  ?Patient Complaints Anxiety;Agitation  ?Eye Contact Brief  ?Facial Expression Anxious  ?Affect Appropriate to circumstance  ?Speech Logical/coherent  ?Interaction Assertive  ?Motor Activity Restless;Tremors  ?Appearance/Hygiene Unremarkable  ?Behavior Characteristics Anxious  ?Mood Anxious  ?Thought Process  ?Coherency WDL  ?Content Ambivalence  ?Delusions None reported or observed  ?Perception WDL  ?Hallucination None reported or observed  ?Judgment Poor  ?Confusion None  ?Danger to Self  ?Current suicidal ideation? Denies  ?Danger to Others  ?Danger to Others None reported or observed  ? ? ?

## 2021-12-18 NOTE — [facility] Suicide Risk Assessment (Signed)
[facility] INPATIENT:  Family/Significant Other Suicide Prevention Education ? ?Suicide Prevention Education:  ?Education Completed; Shatera Rennert [phone] (Mother) has been identified by the patient as the family member/significant other with whom the patient will be residing, and identified as the person(s) who will aid the patient in the event of a mental health crisis (suicidal ideations/suicide attempt).  With written consent from the patient, the family member/significant other has been provided the following suicide prevention education, prior to the and/or following the discharge of the patient. ? ?The suicide prevention education provided includes the following: ?Suicide risk factors ?Suicide prevention and interventions ?National Suicide Hotline telephone number ?[facility] assessment telephone number ?[facility] Emergency Assistance 911 ?[place] and/or Residential Mobile Crisis Unit telephone number ? ?Request made of family/significant other to: ?Remove weapons (e.g., guns, rifles, knives), all items previously/currently identified as safety concern.   ?Remove drugs/medications (over-the-counter, prescriptions, illicit drugs), all items previously/currently identified as a safety concern. ? ?The family member/significant other verbalizes understanding of the suicide prevention education information provided.  The family member/significant other agrees to remove the items of safety concern listed above. ? ?CSW spoke with Mrs. Dampier who states that her daughter has been drinking alcohol more often.  She states that her daughter "cut them out of her life" last year for a short time until she became sick with a damaged Liver.  She stated that they discovered that her daughter's Liver was damaged due to her alcohol use.  She states that her daughter was placed on a transplant list but her value numbers later came down and she stopped drinking which removed her from the transplant list.  Mrs. Zook states that her daughter has mentions "second-hand suicide" to her before.  She states that she would like her daughter to go to a rehab but she states that her daughter has told her that she is not interested at this time. Mrs. Nong states that there are no firearms or weapons in her daughter's possession.  CSW completed SPE with Mrs. Mensch.  ? ?Darleen Crocker ?12/18/2021, 11:47 AM ?

## 2021-12-18 NOTE — BH IP Treatment Plan (Signed)
Interdisciplinary Treatment and Diagnostic Plan Update ? ?12/18/2021 ?Time of Session: 9;15am ?Jeanette Elliott ?MRN: [id number] ? ?Principal Diagnosis: Suicidal ideation ? ?Secondary Diagnoses: Principal Problem: ?  Suicidal ideation ?Active Problems: ?  Anxiety ?  Alcohol dependence ([place]) ?  Major depressive disorder, recurrent episode ([place]) ?  MDD (major depressive disorder), recurrent, severe, with psychosis ([place]) ? ? ?Current Medications:  ?Current Facility-Administered Medications  ?Medication Dose Route Frequency Provider Last Rate Last Admin  ? acetaminophen (TYLENOL) tablet 650 mg  650 mg Oral Q6H PRN Ntuen, Kris Hartmann, FNP      ? alum & mag hydroxide-simeth (MAALOX/MYLANTA) 200-200-20 MG/5ML suspension 30 mL  30 mL Oral Q4H PRN Ntuen, Kris Hartmann, FNP      ? hydrOXYzine (ATARAX) tablet 25 mg  25 mg Oral TID PRN Laretta Bolster, FNP   25 mg at 12/18/21 4098  ? loperamide (IMODIUM) capsule 2-4 mg  2-4 mg Oral PRN Massengill, Ovid Curd, MD      ? LORazepam (ATIVAN) tablet 1 mg  1 mg Oral Q6H PRN Massengill, Nathan, MD      ? LORazepam (ATIVAN) tablet 1 mg  1 mg Oral QID Massengill, Ovid Curd, MD   1 mg at 12/18/21 1191  ? Followed by  ? [START ON 12/19/2021] LORazepam (ATIVAN) tablet 1 mg  1 mg Oral TID Massengill, Ovid Curd, MD      ? Followed by  ? Derrill Memo ON 12/20/2021] LORazepam (ATIVAN) tablet 1 mg  1 mg Oral BID Massengill, Nathan, MD      ? Followed by  ? Derrill Memo ON 12/21/2021] LORazepam (ATIVAN) tablet 1 mg  1 mg Oral Daily Massengill, Nathan, MD      ? magnesium hydroxide (MILK OF MAGNESIA) suspension 30 mL  30 mL Oral Daily PRN Ntuen, Kris Hartmann, FNP      ? mirtazapine (REMERON) tablet 7.5 mg  7.5 mg Oral QHS PRN Hill, Jackie Plum, MD      ? ondansetron (ZOFRAN-ODT) disintegrating tablet 4 mg  4 mg Oral Q6H PRN Massengill, Nathan, MD      ? potassium chloride SA (KLOR-CON M) CR tablet 40 mEq  40 mEq Oral Daily Maida Sale, MD   40 mEq at 12/18/21 4782  ? pregabalin (LYRICA) capsule 25 mg  25 mg Oral BID Maida Sale, MD   25 mg at 12/18/21 9562  ? sucralfate (CARAFATE) tablet 1 g  1 g Oral TID WC & HS Hill, Jackie Plum, MD      ? Derrill Memo ON 12/19/2021] thiamine tablet 100 mg  100 mg Oral Daily Massengill, Nathan, MD      ? traZODone (DESYREL) tablet 50 mg  50 mg Oral QHS PRN Rozetta Nunnery, NP   50 mg at 12/17/21 2204  ? ?PTA Medications: ?Medications Prior to Admission  ?Medication Sig Dispense Refill Last Dose  ? albuterol (VENTOLIN HFA) 108 (90 Base) MCG/ACT inhaler Inhale 2 puffs into the lungs every 6 (six) hours as needed (asthma attacks). 8 g 1 Past Week  ? cariprazine (VRAYLAR) 1.5 MG capsule Take 1 capsule (1.5 mg total) by mouth daily. 30 capsule 1 Past Week  ? Dextromethorphan-buPROPion ER (AUVELITY) 45-105 MG TBCR Take 1 tablet by mouth 2 (two) times daily. 60 tablet 3 Past Week  ? escitalopram (LEXAPRO) 20 MG tablet Take 1 tablet (20 mg total) by mouth daily. 90 tablet 2 Past Week  ? folic acid (FOLVITE) 1 MG tablet TAKE 1 TABLET(1 MG) BY MOUTH DAILY 90 tablet 1 Past Week  ?  furosemide (LASIX) 20 MG tablet Take 1 tablet (20 mg total) by mouth daily. 90 tablet 3 Past Week  ? gabapentin (NEURONTIN) 600 MG tablet Take 600 mg by mouth 2 (two) times daily.     ? hydrOXYzine (ATARAX/VISTARIL) 25 MG tablet Take 2 tablets (50 mg total) by mouth 2 (two) times daily as needed. (Patient taking differently: Take 25 mg by mouth 2 (two) times daily.) 120 tablet 6 Past Week  ? spironolactone (ALDACTONE) 50 MG tablet TAKE 1 TABLET(50 MG) BY MOUTH EVERY MORNING 90 tablet 3 Past Week  ? traZODone (DESYREL) 50 MG tablet TAKE 1 TABLET(50 MG) BY MOUTH AT BEDTIME (Patient taking differently: at bedtime as needed.) 30 tablet 0 Past Month  ? cholecalciferol (VITAMIN D3) 25 MCG (1000 UNIT) tablet Take 1,000 Units by mouth daily.     ? EPINEPHrine 0.3 mg/0.3 mL IJ SOAJ injection Inject 0.3 mg into the muscle once as needed for anaphylaxis. 1 each 0   ? MAGNESIUM PO Take 1 tablet by mouth daily.     ? potassium chloride SA (KLOR-CON)  20 MEQ tablet Take 1 tablet (20 mEq total) by mouth daily. 30 tablet 0   ? ZOLMitriptan (ZOMIG) 2.5 MG tablet Take 1 tablet (2.5 mg total) by mouth once for 1 dose. May repeat in 2 hours if headache persists or recurs. 20 tablet 3   ? ? ?Patient Stressors: Financial difficulties   ?Health problems   ? ?Patient Strengths: Ability for insight  ?Average or above average intelligence  ?Capable of independent living  ?Communication skills  ?Financial means  ?General fund of knowledge  ?Motivation for treatment/growth  ?Supportive family/friends  ?Work skills  ? ?Treatment Modalities: Medication Management, Group therapy, Case management,  ?1 to 1 session with clinician, Psychoeducation, Recreational therapy. ? ? ?Physician Treatment Plan for Primary Diagnosis: Suicidal ideation ?Long Term Goal(s):    ? ?Short Term Goals:   ? ?Medication Management: Evaluate patient's response, side effects, and tolerance of medication regimen. ? ?Therapeutic Interventions: 1 to 1 sessions, Unit Group sessions and Medication administration. ? ?Evaluation of Outcomes: Not Met ? ?Physician Treatment Plan for Secondary Diagnosis: Principal Problem: ?  Suicidal ideation ?Active Problems: ?  Anxiety ?  Alcohol dependence ([place]) ?  Major depressive disorder, recurrent episode ([place]) ?  MDD (major depressive disorder), recurrent, severe, with psychosis ([place]) ? ?Long Term Goal(s):    ? ?Short Term Goals:      ? ?Medication Management: Evaluate patient's response, side effects, and tolerance of medication regimen. ? ?Therapeutic Interventions: 1 to 1 sessions, Unit Group sessions and Medication administration. ? ?Evaluation of Outcomes: Not Met ? ? ?RN Treatment Plan for Primary Diagnosis: Suicidal ideation ?Long Term Goal(s): Knowledge of disease and therapeutic regimen to maintain health will improve ? ?Short Term Goals: Ability to remain free from injury will improve, Ability to verbalize frustration and anger appropriately will improve, Ability  to demonstrate self-control, Ability to participate in decision making will improve, Ability to identify and develop effective coping behaviors will improve, and Compliance with prescribed medications will improve ? ?Medication Management: RN will administer medications as ordered by provider, will assess and evaluate patient's response and provide education to patient for prescribed medication. RN will report any adverse and/or side effects to prescribing provider. ? ?Therapeutic Interventions: 1 on 1 counseling sessions, Psychoeducation, Medication administration, Evaluate responses to treatment, Monitor vital signs and CBGs as ordered, Perform/monitor CIWA, COWS, AIMS and Fall Risk screenings as ordered, Perform wound care treatments as ordered. ? ?  Evaluation of Outcomes: Not Met ? ? ?LCSW Treatment Plan for Primary Diagnosis: Suicidal ideation ?Long Term Goal(s): Safe transition to appropriate next level of care at discharge, Engage patient in therapeutic group addressing interpersonal concerns. ? ?Short Term Goals: Engage patient in aftercare planning with referrals and resources, Increase social support, Increase ability to appropriately verbalize feelings, Identify triggers associated with mental health/substance abuse issues, and Increase skills for wellness and recovery ? ?Therapeutic Interventions: Assess for all discharge needs, 1 to 1 time with Education officer, museum, Explore available resources and support systems, Assess for adequacy in community support network, Educate family and significant other(s) on suicide prevention, Complete Psychosocial Assessment, Interpersonal group therapy. ? ?Evaluation of Outcomes: Not Met ? ? ?Progress in Treatment: ?Attending groups: No. and As evidenced by:  no groups being held on the unit ?Participating in groups: No. ?Taking medication as prescribed: Yes. ?Toleration medication: Yes. ?Family/Significant other contact made: No, will contact:  if consent is provided ?Patient  understands diagnosis: Yes. ?Discussing patient identified problems/goals with staff: Yes. ?Medical problems stabilized or resolved: Yes. ?Denies suicidal/homicidal ideation: Yes. ?Issues/concerns per p

## 2021-12-18 NOTE — Plan of Care (Signed)

## 2021-12-18 NOTE — Progress Notes (Signed)
NUTRITION ASSESSMENT ?RD working remotely. ? ?Pt identified as at risk on the Malnutrition Screen Tool ? ?INTERVENTION: ?- will order Ensure Plus High Protein once/day, each supplement provides 350 kcal and 20 grams of protein. ?- will order 1 tablet multivitamin with minerals daily. ? ?NUTRITION DIAGNOSIS: ?Unintentional weight loss related to sub-optimal intake as evidenced by pt report.  ? ?Goal: ?Pt to meet >/= 90% of their estimated nutrition needs. ? ?Monitor:  ?PO intake ? ?Assessment:  ?Patient admitted for anxiety, severe depression, and SI. She reported daily or near daily SI since she was in high school. ? ?Weight yesterday was 161 lb and weight on 09/29/21 was 165 lb. This indicates 4 lb weight loss (2.4% body weight)  in the past 2 months.  ? ? ?41 y.o. female ? ?Height: ?Ht Readings from Last 1 Encounters:  ?12/17/21 5' 7"  (1.702 m)  ? ? ?Weight: ?Wt Readings from Last 1 Encounters:  ?12/17/21 73 kg  ? ? ?Weight Hx: ?Wt Readings from Last 10 Encounters:  ?12/17/21 73 kg  ?12/09/21 73 kg  ?09/29/21 75 kg  ?09/09/21 75.1 kg  ?08/24/21 72.7 kg  ?06/18/21 63.5 kg  ?06/08/21 67.6 kg  ?06/04/21 65.3 kg  ?05/20/21 63.7 kg  ?05/05/21 60.8 kg  ? ? ?BMI:  Body mass index is 25.22 kg/m?Marland Kitchen ?Pt meets criteria for overweight status based on current BMI. ? ?Estimated Nutritional Needs: ?Kcal: 25-30 kcal/kg ?Protein: > 1 gram protein/kg ?Fluid: 1 ml/kcal ? ?Diet Order:  ?Diet Order   ? ?       ?  Diet full liquid Room service appropriate? Yes; Fluid consistency: Thin  Diet effective now       ?  ? ?  ?  ? ?  ? ?Pt is also offered choice of unit snacks mid-morning and mid-afternoon.  ?Pt is eating as desired.  ? ?Lab results and medications reviewed.  ? ? ? ?Jarome Matin, MS, RD, LDN ?Registered Dietitian II ?Inpatient Clinical Nutrition ?RD pager # and on-call/weekend pager # available in [place]  ? ? ?

## 2021-12-18 NOTE — Group Note (Signed)
LCSW Group Therapy Note ? ? ?Group Date: 12/18/2021 ?Start Time: 1300 ?End Time: 1400 ? ?Type of Therapy and Topic:  Group Therapy: Anger Management   ?  ?Participation Level:  Active ?  ?Description of Group:   Due to the acuity on the unit, staff recommended no close contact at this time so group was not held.  Patient was provided with written information and worksheets about Anger Management.  CSW reviewed the packet with the Pt and answered all questions.  ? ?Darleen Crocker, LCSWA ?12/18/2021  10:52 AM   ? ?

## 2021-12-19 DIAGNOSIS — G621 Alcoholic polyneuropathy: Principal | ICD-10-CM

## 2021-12-19 DIAGNOSIS — F333 Major depressive disorder, recurrent, severe with psychotic symptoms: Principal | ICD-10-CM

## 2021-12-19 LAB — PROLACTIN: Prolactin: 29.1 ng/mL — ABNORMAL HIGH (ref 4.8–23.3)

## 2021-12-19 LAB — VITAMIN B12: Vitamin B-12: 281 pg/mL (ref 180–914)

## 2021-12-19 LAB — LIPID PANEL
Cholesterol: 266 mg/dL — ABNORMAL HIGH (ref 0–200)
HDL: 106 mg/dL (ref >40)
LDL Cholesterol: 139 mg/dL — ABNORMAL HIGH (ref 0–99)
Total CHOL/HDL Ratio: 2.5 RATIO
Triglycerides: 104 mg/dL (ref <150)
VLDL: 21 mg/dL (ref 0–40)

## 2021-12-19 LAB — AMMONIA: Ammonia: 45 umol/L — ABNORMAL HIGH (ref 9–35)

## 2021-12-19 LAB — LIPASE, BLOOD: Lipase: 54 U/L — ABNORMAL HIGH (ref 11–51)

## 2021-12-19 LAB — VITAMIN D 25 HYDROXY (VIT D DEFICIENCY, FRACTURES): Vit D, 25-Hydroxy: 32.6 ng/mL (ref 30–100)

## 2021-12-19 LAB — HEMOGLOBIN A1C
Hgb A1c MFr Bld: 4.8 % (ref 4.8–5.6)
Mean Plasma Glucose: 91.06 mg/dL

## 2021-12-19 LAB — MAGNESIUM: Magnesium: 2 mg/dL (ref 1.7–2.4)

## 2021-12-19 LAB — RPR: RPR Ser Ql: NONREACTIVE

## 2021-12-19 MED ORDER — PREGABALIN 25 MG PO CAPS: 25.0000 mg | ORAL_CAPSULE | Freq: Two times a day (BID) | ORAL | 0 refills | Status: DC

## 2021-12-19 NOTE — Plan of Care (Signed)
Discharge note: Nursing discharge note: Patient discharged home per MD order.  Patient received all personal belongings from unit and locker.  Reviewed AVS/transition record with patient and she indicates understanding. Patient has all follow up appointments in place.  Patient denies any thoughts of self harm. She left ambulatory with her parents. ? ?

## 2021-12-19 NOTE — Progress Notes (Signed)
?   12/19/21 0800  ?Psych Admission Type (Psych Patients Only)  ?Admission Status Voluntary  ?Psychosocial Assessment  ?Patient Complaints Anxiety  ?Eye Contact Brief  ?Facial Expression Anxious  ?Affect Appropriate to circumstance  ?Speech Logical/coherent  ?Interaction Assertive  ?Motor Activity Restless  ?Appearance/Hygiene Unremarkable  ?Behavior Characteristics Cooperative  ?Mood Depressed  ?Thought Process  ?Coherency WDL  ?Content WDL  ?Delusions None reported or observed  ?Perception WDL  ?Hallucination None reported or observed  ?Judgment Poor  ?Confusion None  ?Danger to Self  ?Current suicidal ideation? Denies  ?Danger to Others  ?Danger to Others None reported or observed  ? ? ?

## 2021-12-19 NOTE — Progress Notes (Signed)
?   12/18/21 2104  ?Psych Admission Type (Psych Patients Only)  ?Admission Status Voluntary  ?Psychosocial Assessment  ?Patient Complaints Anxiety;Agitation  ?Eye Contact Brief  ?Facial Expression Anxious  ?Affect Appropriate to circumstance  ?Speech Logical/coherent  ?Interaction Assertive  ?Motor Activity Restless  ?Appearance/Hygiene Unremarkable  ?Behavior Characteristics Cooperative;Appropriate to situation  ?Mood Depressed;Pleasant  ?Thought Process  ?Coherency WDL  ?Content Ambivalence  ?Delusions None reported or observed  ?Perception WDL  ?Hallucination None reported or observed  ?Judgment Poor  ?Confusion None  ?Danger to Self  ?Current suicidal ideation? Denies  ?Danger to Others  ?Danger to Others None reported or observed  ? ? ?

## 2021-12-19 NOTE — Discharge Summary (Signed)
Physician Discharge Summary Note ? ?Patient:  Jeanette Elliott is an 41 y.o., female ?MRN:  [id number] ?DOB:  Jun 25, 1981 ?Patient phone:  [phone] (home)  ?Patient address:   ?[place] ?[place] [address],  ?Total Time spent with patient:  Greater than 30 minutes ? ?Date of Admission:  12/17/2021 ? ?Date of Discharge: 12-19-21 ? ?Reason for Admission: Worsening suicidal ideations & alcohol consumption. ? ?Principal Problem: MDD (major depressive disorder), recurrent, severe, with psychosis ([place]) ?Discharge Diagnoses: Principal Problem: ?  MDD (major depressive disorder), recurrent, severe, with psychosis ([place]) ?Active Problems: ?  Anxiety ?  Alcohol dependence ([place]) ?  Suicidal ideation ?  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 ?Past Psychiatric History: Alcohol use disorder, MDD. ? ?Past Medical History:  ?Past Medical History:  ?Diagnosis Date  ? Asthma   ? Elevated liver enzymes   ? Jaundice   ? Sjogren's syndrome ([place])   ?  ?Past Surgical History:  ?Procedure Laterality Date  ? IR PARACENTESIS  02/03/2021  ? IR THORACENTESIS ASP PLEURAL SPACE W/IMG GUIDE  01/16/2021  ? ORIF FINGER / THUMB FRACTURE Left   ? Thumb  ? SUBMANDIBULAR GLAND EXCISION    ? TONSILLECTOMY AND ADENOIDECTOMY    ? ?Family History:  ?Family History  ?Problem Relation Age of Onset  ? Arthritis Mother   ? Heart disease Father   ? Cancer Father   ?     Prostate  ? Arthritis Father   ? Heart disease Maternal Grandmother   ? Stroke Maternal Grandmother   ? Depression Maternal Grandmother   ? Alzheimer's disease Maternal Grandfather   ? Heart disease Paternal Grandfather   ? Heart disease Paternal Uncle   ? Colon cancer Neg Hx   ? Esophageal cancer Neg Hx   ? ?Family Psychiatric  History: See H&P ? ?Social History:  ?Social History  ? ?Substance and Sexual Activity  ?Alcohol Use Yes  ? Alcohol/week: 7.0 standard drinks  ? Types: 7 Shots of liquor per week  ?   ?Social History  ? ?Substance and Sexual Activity  ?Drug Use Never  ?  ?Social  History  ? ?Socioeconomic History  ? Marital status: Divorced  ?  Spouse name: Not on file  ? Number of children: Not on file  ? Years of education: BA  ? Highest education level: Not on file  ?Occupational History  ? Occupation: Disabled  ?  Comment: Prior Training and development officer  ?Tobacco Use  ? Smoking status: Never  ? Smokeless tobacco: Never  ?Vaping Use  ? Vaping Use: Never used  ?Substance and Sexual Activity  ? Alcohol use: Yes  ?  Alcohol/week: 7.0 standard drinks  ?  Types: 7 Shots of liquor per week  ? Drug use: Never  ? Sexual activity: Yes  ?  Birth control/protection: None  ?Other Topics Concern  ? Not on file  ?Social History Narrative  ? Right handed  ? Diet coke- 2 per day  ? ?Social Determinants of Health  ? ?Financial Resource Strain: Not on file  ?Food Insecurity: Not on file  ?Transportation Needs: Not on file  ?Physical Activity: Not on file  ?Stress: Not on file  ?Social Connections: Not on file  ? ?Hospital Course: (Per admission evaluation notes): 41 y/o female presenting to [facility] as a walk-in. She was transported to [facility] by her mother Jeanette Elliott) [phone].  Patient also accompanied in today's assessment by mother. Clinician asked patient what brings her to [facility] today and she states, ?My mother?. Her  mother explains that Jeanette Elliott called her this morning indicating that she wanted to die. Patient states that she has daily suicidal thoughts, which patient agrees to be chronic thoughts. Her suicidal thoughts started when she was in [employer]. The thoughts are frequent and daily. Patient asked if she has a suicide plan and she state ?No?, initially. Then states, ?I mean, I can always think of suicide plans?. ? ?Upon the decision to discharge Jeanette Elliott today, she was seen & evaluated  by her treatment team for mental health stability. The current laboratory findings were reviewed. The nurses notes & vital signs were reviewed as well. All are stable. At this present time, there are no current  mental health or medical issues that should prevent this discharge at this time. Patient is being discharged to continue mental health care & medication management with her current outpatient provider as noted below.  ? ?Aide's stay in this [facility] hospital was rather very brief. She was admitted to the [facility] hospital with a complaint of wanting to die. However, chart review results indicated that her suicidal thoughts are chronic in nature as they have been going on since her high school years. She was recommended for inpatient psychiatric admission for evaluation & mood stabilization treatments/safety.  ? ?During her admission assessment/evaluation, Jeanette Elliott stated that she would like to be discharged as she is no longer feeling depressed or having suicidal thoughts. She stated that she does have a good psychiatrist & therapist at [facility]. She added that her medications were recently adjusted/changed & she was doing well on those medications. She stated that she recently changed jobs, felt stressed, wanted to unwind after a hard day at work, relapsed on alcohol in the process. 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 She reported after stabilization, she stopped alcohol use completely, but just relapsed two weeks ago. She stated that she was drinking 2 shots of vodka at bedtime, did not take her mental health medications for 2 days & that was the reason the suicidal ideations started. She stated that she has learned her lesson. ? ?During the follow-up care assessment this morning, Jeanette Elliott presents with a good affect, good eye contact, is alert & oriented x 3. She is aware of situation & able to make concrete decisions/requests. She stated that she is doing very well, slept well last & feeling bored already. She has asked to be discharged today to continue mental health treatments/counseling sessions with her  current outpatient psychiatrist/therapist at the [facility]. She presents mentally & medically stable. She denies any symptoms of depression or anxiety. She denies any SIHI, plans or intent. She denies any AVH, delusional thoughts or paranoia. She does not appear to be responding to any internal stimuli. She denies any substance withdrawal symptoms. And because there are no clinical criteria to keep [place] admitted to the hospital at this time, she is being discharged as requested to her place of residence. She is committed to abstaining from substances.  ? ?Upon her discharge today, Arelly appears much more in control of her mood & behavior. Her symptoms were reported as significantly improved or completely resolved There are currently, no active SI plans or intent, AVH, delusional thoughts or paranoia. She is going to pursue outpatient treatment in her own terms with her current outpatient providers as noted below. Doxie was able to engage in safety planning including plan to return to [facility] or  contact emergency services if she feels unable to maintain her own safety or the safety of others. Pt had no further questions, comments or concerns. She left [facility] with all personal belongings in no apparent distress. Transportation per her family (parents).  ? ?Physical Findings: ?AIMS:  , ,  ,  ,    ?CIWA:  CIWA-Ar Total: 1 ?COWS:    ? ?Musculoskeletal: ?Strength & Muscle Tone: within normal limits ?Gait & Station: normal ?Patient leans: N/A ? ?Psychiatric Specialty Exam: ? ?Presentation  ?General Appearance: Appropriate for Environment; Casual; Fairly Groomed ? ?Eye Contact:Good ? ?Speech:Clear and Coherent ? ?Speech Volume:Normal ? ?Handedness:Right ? ? ?Mood and Affect  ?Mood:Euthymic ? ?Affect:Appropriate; Congruent ? ? ?Thought Process  ?Thought Processes:Coherent; Goal Directed ? ?Descriptions of Associations:Intact ? ?Orientation:Full (Time, Place and Person) ? ?Thought Content:Logical ? ?History of  Schizophrenia/Schizoaffective disorder:No ? ?Duration of Psychotic Symptoms:No data recorded ?Hallucinations:Hallucinations: None ? ?Ideas of Reference:None ? ?Suicidal Thoughts:Suicidal Thoughts: No ?SI Passive Inten

## 2021-12-19 NOTE — Progress Notes (Signed)
?  [facility] Adult Case Management Discharge Plan : ? ?Will you be returning to the same living situation after discharge:  Yes,  alone ?At discharge, do you have transportation home?: Yes,  parents ?Do you have the ability to pay for your medications: Yes,  has insurance ? ?Release of information consent forms completed and emailed to Medical Records, then turned in to Medical Records by CSW.  ? ?Patient to Follow up at: ? Follow-up Information   ? ? [facility]. Schedule an appointment as soon as possible for a visit.   ?Specialty: Behavioral Health ?Why: Please follow up with your therapist [name] and psychiatrist Lesle Chris after hospital discharge. ?Contact information: ?[address], [address] ?[place] ?[phone] ? ?  ?  ? ?  ?  ? ?  ? ? ?Next level of care provider has access to [place] ? ?Safety Planning and Suicide Prevention discussed: Yes,  with mother ? ?  ? ?Has patient been referred to the Quitline?: N/A patient is not a smoker ? ?Patient has been referred for addiction treatment: Yes ? ?Maretta Los, LCSW ?12/19/2021, 9:06 AM ?

## 2021-12-19 NOTE — BHH Suicide Risk Assessment (Signed)
Suicide Risk Assessment ? ?Discharge Assessment    ?[facility] Discharge Suicide Risk Assessment ? ?Principal Problem: MDD (major depressive disorder), recurrent, severe, with psychosis ([place]) ? ?Discharge Diagnoses: Principal Problem: ?  MDD (major depressive disorder), recurrent, severe, with psychosis ([place]) ?Active Problems: ?  Anxiety ?  Alcohol dependence ([place]) ?  Suicidal ideation ?  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 ?Total Time spent with patient:  Greater than 30 minutes ? ?Musculoskeletal: ?Strength & Muscle Tone: within normal limits ?Gait & Station: normal ?Patient leans: N/A ? ?Psychiatric Specialty Exam ? ?Presentation  ?General Appearance: Appropriate for Environment; Casual; Fairly Groomed ? ?Eye Contact:Good ? ?Speech:Clear and Coherent ? ?Speech Volume:Normal ? ?Handedness:Right ? ?Mood and Affect  ?Mood:Euthymic ? ?Duration of Depression Symptoms: Greater than two weeks ? ?Affect:Appropriate; Congruent ? ?Thought Process  ?Thought Processes:Coherent; Goal Directed ? ?Descriptions of Associations:Intact ? ?Orientation:Full (Time, Place and Person) ? ?Thought Content:Logical ? ?History of Schizophrenia/Schizoaffective disorder:No ? ?Duration of Psychotic Symptoms: NA ? ?Hallucinations:Hallucinations: None ? ?Ideas of Reference:None ? ?Suicidal Thoughts:Suicidal Thoughts: No ?SI Passive Intent and/or Plan: Without Intent; Without Plan; Without Means to Carry Out; Without Access to Means ? ?Homicidal Thoughts:Homicidal Thoughts: No ? ?Sensorium  ?Memory:Immediate Good; Recent Good; Remote Good ? ?Judgment:Good ? ?Insight:Fair ? ?Executive Functions  ?Concentration:Good ? ?Attention Span:Good ? ?Recall:Good ? ?Fund of [place] ? ?Language:Good ? ?Psychomotor Activity  ?Psychomotor Activity:Psychomotor Activity: Normal ? ?Assets  ?Assets:Housing; Financial Resources/Insurance; Desire for Improvement; Resilience; Social Support ? ?Sleep  ?Sleep:Sleep: Good ?Number of Hours of Sleep: 7 ? ?Physical  Exam:See discharge summary. ?Blood pressure 113/89, pulse (!) 106, temperature 97.6 ?F (36.4 ?C), resp. rate 17, height 5' 7"  (1.702 m), weight 73 kg, SpO2 99 %. Body mass index is 25.22 kg/m?. ? ?Mental Status Per Nursing Assessment::   ?On Admission:  Suicidal ideation indicated by patient ? ?Demographic Factors:  ?Adolescent or young adult and Caucasian ? ?Loss Factors: ?NA ? ?Historical Factors: ?Prior suicide attempts and Impulsivity ? ?Risk Reduction Factors:   ?Sense of responsibility to family, Employed, Positive social support, and Positive therapeutic relationship ? ?Continued Clinical Symptoms:  ?Depression:   Impulsivity ?Alcohol/Substance Abuse/Dependencies ?Previous Psychiatric Diagnoses and Treatments ? ?Cognitive Features That Contribute To Risk:  ?Closed-mindedness and Thought constriction (tunnel vision)   ? ?Suicide Risk:  ?Minimal: No identifiable suicidal ideation.  Patients presenting with no risk factors but with morbid ruminations; may be classified as minimal risk based on the severity of the depressive symptoms ? ? Follow-up Information   ? ? [facility]. Schedule an appointment as soon as possible for a visit.   ?Specialty: Behavioral Health ?Why: Please follow up with your therapist [name] and psychiatrist Lesle Chris after hospital discharge. ?Contact information: ?[address] ?[place] ?[phone] ? ?  ?  ? ?  ?  ? ?  ? ?Plan Of Care/Follow-up recommendations:  ?Activity:  As tolerated ?Diet: As recommended by your primary care doctor. ?Keep all scheduled follow-up appointments as recommended.  ? ?Lindell Spar, NP, pmhnp, fnp-bc ?12/19/2021, 9:57 AM ?

## 2021-12-21 ENCOUNTER — Telehealth: Payer: Self-pay | Admitting: Psychiatry

## 2021-12-21 NOTE — Telephone Encounter (Signed)
Patient called into office about 4:50 stating that HI had given a call to check on her and she was returning the call. Ph: [phone] ?

## 2021-12-22 ENCOUNTER — Ambulatory Visit (INDEPENDENT_AMBULATORY_CARE_PROVIDER_SITE_OTHER): Payer: 59 | Admitting: Psychiatry

## 2021-12-22 DIAGNOSIS — F411 Generalized anxiety disorder: Secondary | ICD-10-CM | POA: Diagnosis not present

## 2021-12-22 NOTE — Telephone Encounter (Signed)
Returned patient call and scheduled a session with her for 11:00 AM on 12/22/2021 ?

## 2021-12-22 NOTE — Progress Notes (Signed)
?    [facility] Counselor/Therapist Progress Note ? ?Patient ID: Jeanette Elliott, MRN: [id number],   ? ?Date: 12/22/2021 ? ?Time Spent: 66 minutes start time 11:04 AM end time 12:10 ? ?Virtual Visit via Telehealth Note ?Connected with patient by a telemedicine/telehealth application, with their informed consent, and verified patient privacy and that I am speaking with the correct person using two identifiers. I discussed the limitations, risks, security and privacy concerns of performing psychotherapy and the availability of in person appointments. I also discussed with the patient that there may be a patient responsible charge related to this service. The patient expressed understanding and agreed to proceed. ?I discussed the treatment planning with the patient. The patient was provided an opportunity to ask questions and all were answered. The patient agreed with the plan and demonstrated an understanding of the instructions. The patient was advised to call  our office if  symptoms worsen or feel they are in a crisis state and need immediate contact. ?  ?Therapist Location: office ?Patient Location: home ?  ? ?Treatment Type: Individual Therapy ? ?Reported Symptoms: anxiety, sadness,rumination, crying spells, weight loss, rumination, intrusive thoughts, sleep issues ? ?Mental Status Exam: ? ?Appearance:   NA     ?Behavior:  na  ?Motor:  NA  ?Speech/Language:   Normal Rate  ?Affect:  NA  ?Mood:  depressed  ?Thought process:  normal  ?Thought content:    WNL  ?Sensory/Perceptual disturbances:    WNL  ?Orientation:  oriented to person, place, time/date, and situation  ?Attention:  Good  ?Concentration:  Good  ?Memory:  WNL  ?Fund of knowledge:   Fair  ?Insight:    Good  ?Judgment:   Good  ?Impulse Control:  Good  ? ?Risk Assessment: ?Danger to Self:  Not active SI but states had passive SI at times, she stated that she would not harm herself-safety plan was developed in session ?Self-injurious Behavior: No ?Danger to  Others: No ?Duty to Warn:no ?Physical Aggression / Violence:No  ?Access to Firearms a concern: No  ?Gang Involvement:No  ? ?Subjective: Met with patient via phone call.  She tried to connect through video but it could not workout. She shared that she was released from the hospital and that it was a horrible experience, due to multiple problems including finding food to eat since she is vegetarian. She shared she was  fired due to being at the hospital.  Patient was very tearful and stated she was tired of living this way.  Allowed patient time to release what she was feeling and then helped her process through what she really wanted.  Patient was able to recognize that she does not want to harm herself but she does not want to continue with her life the way it was.  Had patient think through what she really wanted and then discussed different steps that would help her get to that point.  Patient stated she does want to be a mother.  She acknowledged that at this time her body is not physically able and it may never be to that place.  Discussed the importance of focusing on getting more physically stable as well as mentally stable.  Discussed small steps that would help her move in that positive direction.  Patient was able to develop some plans that she felt gave her some hope that things could be different in her life.  The goal is in 6 months to start thinking about adopting or see if her body would be  capable of having a child.  Patient was able to think of physical things that she can do including starting making smoothies and making sure she is getting the nutrients that she needs.  She was also agreeable to continue trying different medications until she finds something that helps her feel the way that she wants to feel.  Patient is also going to start working on her home and getting it to a better place as well. ? ?Interventions: Cognitive Behavioral Therapy, Solution-Oriented/Positive Psychology, and  Insight-Oriented ? ?Diagnosis: ?  ICD-10-CM   ?1. Generalized anxiety disorder  F41.1   ?  ? ? ?Plan:  Patient is to use CBT and coping skills to decrease anxiety symptoms.  Patient is to do a brain dump at night and write as quickly as she can for a few minutes then close the journal.  Patient is to consider using brain spotting bilateral music to calm herself as needed.  Patient is to continue working on pursuing different positive activities that she can do with her physical limitations to keep her brain engaged in positive.  Patient is to start working on better health including having smoothies regularly trying to get in the correct nutrients.  Patient is also to start working on getting her home in a better shape with cleanliness and organization.  Patient is to work on her self talk and focus on the things that she wants in life and ways to achieve those goals.  Patient is to take medication as directed. ?Long term goal: Resolve the core conflict that is the source of anxiety ?Short term goal: Identify the major life conflicts from the past and present that form the basis for present anxiety ? ?[name], [facility] ? ? ? ? ? ? ? ? ? ? ? ? ? ? ? ? ? ? ?

## 2021-12-23 ENCOUNTER — Other Ambulatory Visit: Payer: Self-pay | Admitting: Family Medicine

## 2021-12-23 ENCOUNTER — Encounter: Payer: Self-pay | Admitting: Behavioral Health

## 2021-12-23 ENCOUNTER — Ambulatory Visit (INDEPENDENT_AMBULATORY_CARE_PROVIDER_SITE_OTHER): Payer: 59 | Admitting: Behavioral Health

## 2021-12-23 DIAGNOSIS — G621 Alcoholic polyneuropathy: Secondary | ICD-10-CM

## 2021-12-23 DIAGNOSIS — F333 Major depressive disorder, recurrent, severe with psychotic symptoms: Secondary | ICD-10-CM | POA: Diagnosis not present

## 2021-12-23 DIAGNOSIS — F411 Generalized anxiety disorder: Secondary | ICD-10-CM

## 2021-12-23 DIAGNOSIS — F1010 Alcohol abuse, uncomplicated: Secondary | ICD-10-CM | POA: Diagnosis not present

## 2021-12-23 DIAGNOSIS — R45851 Suicidal ideations: Secondary | ICD-10-CM | POA: Diagnosis not present

## 2021-12-23 DIAGNOSIS — G629 Polyneuropathy, unspecified: Secondary | ICD-10-CM

## 2021-12-23 MED ORDER — PREGABALIN 25 MG PO CAPS: 25.0000 mg | ORAL_CAPSULE | Freq: Two times a day (BID) | ORAL | 2 refills | Status: DC

## 2021-12-23 NOTE — Addendum Note (Signed)
Addended by: Haydee Salter on: 12/23/2021 01:06 PM ? ? Modules accepted: Orders ? ?

## 2021-12-23 NOTE — Addendum Note (Signed)
Addended by: Konrad Saha on: 12/23/2021 02:32 PM ? ? Modules accepted: Orders ? ?

## 2021-12-23 NOTE — Telephone Encounter (Signed)
Can you please and thank you resend to the [facility].  Dm/cma ? ?

## 2021-12-23 NOTE — Telephone Encounter (Signed)
Lft VM to return call. Dm/cma ? ?

## 2021-12-23 NOTE — Telephone Encounter (Addendum)
Spoke to patient, advised on the reasons for ENT referral and that they would be the ones to advise if surgery is needed, she has an appointment in May.   ? ?She states while in the hospital she was give a small supply of Lyrica 25 mg #6 tablets to replace the Gabapentin and she feels like it is helping her.  She was only given a little bit and wants to know if you would be willing to give her a refill? ?([facility]; [place]) ? ?Please review and advise.  ?Thanks .Dm/cma ? ?

## 2021-12-23 NOTE — Telephone Encounter (Signed)
Pt saw Dr. Gena Fray on 3/29 for her neck. She stated it was her thyroid and she feels she needs surgery because it is affecting her voice. ?Please call and advise. ?

## 2021-12-23 NOTE — Progress Notes (Signed)
Jeanette Elliott ?[id number] ?Jan 30, 1981 ?41 y.o. ? ?Virtual Visit via Telephone Note ? ?I connected with pt on 12/23/21 at 10:30 AM EDT by telephone and verified that I am speaking with the correct person using two identifiers. ?  ?I discussed the limitations, risks, security and privacy concerns of performing an evaluation and management service by telephone and the availability of in person appointments. I also discussed with the patient that there may be a patient responsible charge related to this service. The patient expressed understanding and agreed to proceed. ?  ?I discussed the assessment and treatment plan with the patient. The patient was provided an opportunity to ask questions and all were answered. The patient agreed with the plan and demonstrated an understanding of the instructions. ?  ?The patient was advised to call back or seek an in-person evaluation if the symptoms worsen or if the condition fails to improve as anticipated. ? ?I provided 30  minutes of non-face-to-face time during this encounter.  The patient was located at home.  The provider was located at [place]. ? ? ?Jeanette Brooklyn, NP ? ? ?Subjective:  ? ?Patient ID:  Jeanette Elliott is a 41 y.o. (DOB 18-Nov-1980) female. ? ?Chief Complaint:  ?Chief Complaint  ?Patient presents with  ? Anxiety  ? Depression  ? Alcohol Problem  ? Follow-up  ? Medication Problem  ? ? ?HPI ? ?41 year old female was interviewed by telephone at patients request. Patient sounded confused and speech was slurred.  It was determined that she most likely was under the influence of ETOH. I could not obtain any reliable collateral information and I obtained verbal permission to contact her mother due to the emergent circumstances. She said, "I wanted to commit secondary suicide by finishing off my liver". She said she had googled how to do it. When asked if she had a plan to harm herself, she said no.She said She contracted for safety and said that she would  ask her mom for help. She said she has not thought of any way to harm self other than drinking but refused to go to a facility. She said that she would not come in for appointment if I was going to have her committed. Towards end of conversation she was not making any logical sense. I notified her mother and she said that she would check on patients welfare. Was not able to further assess but pt did agree to come back on 4/18 for further assessment.   ?  ?Previous medication trials: ?  ?Venlafaxine ?Wellbutrin  ?Paxil  ? ? ?Review of Systems:  ?Review of Systems ? ?Medications: I have reviewed the patient's current medications. ? ?Current Outpatient Medications  ?Medication Sig Dispense Refill  ? albuterol (VENTOLIN HFA) 108 (90 Base) MCG/ACT inhaler Inhale 2 puffs into the lungs every 6 (six) hours as needed (asthma attacks). 8 g 1  ? cariprazine (VRAYLAR) 1.5 MG capsule Take 1 capsule (1.5 mg total) by mouth daily. 30 capsule 1  ? cholecalciferol (VITAMIN D3) 25 MCG (1000 UNIT) tablet Take 1,000 Units by mouth daily.    ? Dextromethorphan-buPROPion ER (AUVELITY) 45-105 MG TBCR Take 1 tablet by mouth 2 (two) times daily. 60 tablet 3  ? EPINEPHrine 0.3 mg/0.3 mL IJ SOAJ injection Inject 0.3 mg into the muscle once as needed for anaphylaxis. 1 each 0  ? escitalopram (LEXAPRO) 20 MG tablet Take 1 tablet (20 mg total) by mouth daily. 90 tablet 2  ? folic acid (FOLVITE) 1 MG  tablet TAKE 1 TABLET(1 MG) BY MOUTH DAILY 90 tablet 1  ? furosemide (LASIX) 20 MG tablet Take 1 tablet (20 mg total) by mouth daily. 90 tablet 3  ? hydrOXYzine (ATARAX/VISTARIL) 25 MG tablet Take 2 tablets (50 mg total) by mouth 2 (two) times daily as needed. (Patient taking differently: Take 25 mg by mouth 2 (two) times daily.) 120 tablet 6  ? potassium chloride SA (KLOR-CON) 20 MEQ tablet Take 1 tablet (20 mEq total) by mouth daily. 30 tablet 0  ? pregabalin (LYRICA) 25 MG capsule Take 1 capsule (25 mg total) by mouth 2 (two) times daily. 60  capsule 2  ? spironolactone (ALDACTONE) 50 MG tablet TAKE 1 TABLET(50 MG) BY MOUTH EVERY MORNING 90 tablet 3  ? traZODone (DESYREL) 50 MG tablet TAKE 1 TABLET(50 MG) BY MOUTH AT BEDTIME (Patient taking differently: at bedtime as needed.) 30 tablet 0  ? ZOLMitriptan (ZOMIG) 2.5 MG tablet Take 1 tablet (2.5 mg total) by mouth once for 1 dose. May repeat in 2 hours if headache persists or recurs. 20 tablet 3  ? ?No current facility-administered medications for this visit.  ? ? ?Medication Side Effects: None and Headache ? ?Allergies:  ?Allergies  ?Allergen Reactions  ? Latex Itching and Rash  ? ? ?Past Medical History:  ?Diagnosis Date  ? Asthma   ? Elevated liver enzymes   ? Jaundice   ? Sjogren's syndrome ([place])   ? ? ?Family History  ?Problem Relation Age of Onset  ? Arthritis Mother   ? Heart disease Father   ? Cancer Father   ?     Prostate  ? Arthritis Father   ? Heart disease Maternal Grandmother   ? Stroke Maternal Grandmother   ? Depression Maternal Grandmother   ? Alzheimer's disease Maternal Grandfather   ? Heart disease Paternal Grandfather   ? Heart disease Paternal Uncle   ? Colon cancer Neg Hx   ? Esophageal cancer Neg Hx   ? ? ?Social History  ? ?Socioeconomic History  ? Marital status: Divorced  ?  Spouse name: Not on file  ? Number of children: Not on file  ? Years of education: BA  ? Highest education level: Not on file  ?Occupational History  ? Occupation: Disabled  ?  Comment: Prior Training and development officer  ?Tobacco Use  ? Smoking status: Never  ? Smokeless tobacco: Never  ?Vaping Use  ? Vaping Use: Never used  ?Substance and Sexual Activity  ? Alcohol use: Yes  ?  Alcohol/week: 7.0 standard drinks  ?  Types: 7 Shots of liquor per week  ? Drug use: Never  ? Sexual activity: Yes  ?  Birth control/protection: None  ?Other Topics Concern  ? Not on file  ?Social History Narrative  ? Right handed  ? Diet coke- 2 per day  ? ?Social Determinants of Health  ? ?Financial Resource Strain: Not on file  ?Food  Insecurity: Not on file  ?Transportation Needs: Not on file  ?Physical Activity: Not on file  ?Stress: Not on file  ?Social Connections: Not on file  ?Intimate Partner Violence: Not on file  ? ? ?Past Medical History, Surgical history, Social history, and Family history were reviewed and updated as appropriate.  ? ?Please see review of systems for further details on the patient's review from today.  ? ?Objective:  ? ?Physical Exam:  ?There were no vitals taken for this visit. ? ?Physical Exam ? ?Lab Review:  ?   ?Component Value Date/Time  ?  NA 136 12/18/2021 0646  ? K 3.0 (L) 12/18/2021 0646  ? CL 98 12/18/2021 0646  ? CO2 24 12/18/2021 0646  ? GLUCOSE 112 (H) 12/18/2021 0646  ? BUN 9 12/18/2021 0646  ? CREATININE 0.90 12/18/2021 0646  ? CREATININE 0.64 01/28/2021 1423  ? CALCIUM 9.2 12/18/2021 0646  ? PROT 7.4 12/18/2021 0646  ? PROT 7.1 05/20/2021 1103  ? ALBUMIN 4.1 12/18/2021 0646  ? AST 234 (H) 12/18/2021 0646  ? ALT 117 (H) 12/18/2021 0646  ? ALKPHOS 339 (H) 12/18/2021 9611  ? BILITOT 2.7 (H) 12/18/2021 6435  ? GFRNONAA >60 12/18/2021 0646  ? ? ?   ?Component Value Date/Time  ? WBC 5.1 12/18/2021 0646  ? RBC 3.83 (L) 12/18/2021 0646  ? HGB 13.0 12/18/2021 0646  ? HCT 36.3 12/18/2021 0646  ? PLT 143 (L) 12/18/2021 0646  ? MCV 94.8 12/18/2021 0646  ? MCH 33.9 12/18/2021 0646  ? MCHC 35.8 12/18/2021 0646  ? RDW 14.3 12/18/2021 0646  ? LYMPHSABS 2.8 04/02/2021 1230  ? MONOABS 0.7 04/02/2021 1230  ? EOSABS 0.2 04/02/2021 1230  ? BASOSABS 0.2 (H) 04/02/2021 1230  ? ? ?No results found for: POCLITH, LITHIUM  ? ?No results found for: PHENYTOIN, PHENOBARB, VALPROATE, CBMZ  ? ?.res ?Assessment: Plan:   ? ?Elizzie was seen today for anxiety, depression, alcohol problem, follow-up and medication problem. ? ?Diagnoses and all orders for this visit: ? ?Generalized anxiety disorder ? ?Suicidal ideation ? ?Alcohol abuse ? ?Severe episode of recurrent major depressive disorder, with psychotic features ([place]) ? ?Alcohol-induced  polyneuropathy ([place]) ? ? Greater than 50% of 30  min telephonic interview  with patient was spent on counseling and coordination of care.  We discussed her recent hospitalization for Alcohol induced Polyneuro

## 2021-12-29 ENCOUNTER — Ambulatory Visit (INDEPENDENT_AMBULATORY_CARE_PROVIDER_SITE_OTHER): Payer: 59 | Admitting: Behavioral Health

## 2021-12-29 ENCOUNTER — Encounter: Payer: Self-pay | Admitting: Behavioral Health

## 2021-12-29 DIAGNOSIS — F333 Major depressive disorder, recurrent, severe with psychotic symptoms: Secondary | ICD-10-CM

## 2021-12-29 DIAGNOSIS — G621 Alcoholic polyneuropathy: Secondary | ICD-10-CM

## 2021-12-29 DIAGNOSIS — R45851 Suicidal ideations: Secondary | ICD-10-CM

## 2021-12-29 DIAGNOSIS — F411 Generalized anxiety disorder: Secondary | ICD-10-CM

## 2021-12-29 DIAGNOSIS — F1010 Alcohol abuse, uncomplicated: Secondary | ICD-10-CM

## 2021-12-29 DIAGNOSIS — F331 Major depressive disorder, recurrent, moderate: Secondary | ICD-10-CM

## 2021-12-29 DIAGNOSIS — I739 Peripheral vascular disease, unspecified: Secondary | ICD-10-CM

## 2021-12-29 DIAGNOSIS — M3500 Sicca syndrome, unspecified: Secondary | ICD-10-CM

## 2021-12-29 NOTE — Progress Notes (Signed)
[facility] Med Check ? ?Patient ID: Helmut Muster,  ?MRN: [id number] ? ?PCP: Haydee Salter, MD ? ?Date of Evaluation: 12/29/2021 ?Time spent:40 minutes ? ?Chief Complaint:  ?Chief Complaint   ?Alcohol Problem; Follow-up; Family Problem; Medication Problem; Patient Education ?  ? ? ?HISTORY/CURRENT STATUS: ?HPI ? ?41 year old female presented for follow up and medication management.  She has unsteady gait that she attribute to neuropathy. She is clear today with her parents present during interview with her consent.  She is refusing to do inpatient residential treatment today but does agree to find IOP. She admits to daily drinking and still has thoughts about drinking to intentionally damage her liver. She says she is severely depressed and wants to continue her regular medications. She is not comprehending the seriousness of her liver complications and continued abnormal labs. She understands that residential or IOP is required for me to continue safely treating her. She denies current mania, no psychosis but appears to be cognitively confused at times. SI without a plan, No HI. Patient verbally contracted for safety and will be staying with parents until she improves. Her parents agreed to start contacting resources promptly.  ? ?  ?Venlafaxine ?Wellbutrin  ?Paxil  ?  ? ?Individual Medical History/ Review of Systems: Changes? :No  ? ?Allergies: Latex ? ?Current Medications:  ?Current Outpatient Medications:  ?  albuterol (VENTOLIN HFA) 108 (90 Base) MCG/ACT inhaler, Inhale 2 puffs into the lungs every 6 (six) hours as needed (asthma attacks)., Disp: 8 g, Rfl: 1 ?  cariprazine (VRAYLAR) 1.5 MG capsule, Take 1 capsule (1.5 mg total) by mouth daily., Disp: 30 capsule, Rfl: 1 ?  cholecalciferol (VITAMIN D3) 25 MCG (1000 UNIT) tablet, Take 1,000 Units by mouth daily., Disp: , Rfl:  ?  Dextromethorphan-buPROPion ER (AUVELITY) 45-105 MG TBCR, Take 1 tablet by mouth 2 (two) times daily., Disp: 60 tablet, Rfl: 3 ?   EPINEPHrine 0.3 mg/0.3 mL IJ SOAJ injection, Inject 0.3 mg into the muscle once as needed for anaphylaxis., Disp: 1 each, Rfl: 0 ?  escitalopram (LEXAPRO) 20 MG tablet, Take 1 tablet (20 mg total) by mouth daily., Disp: 90 tablet, Rfl: 2 ?  folic acid (FOLVITE) 1 MG tablet, TAKE 1 TABLET(1 MG) BY MOUTH DAILY, Disp: 90 tablet, Rfl: 1 ?  furosemide (LASIX) 20 MG tablet, Take 1 tablet (20 mg total) by mouth daily., Disp: 90 tablet, Rfl: 3 ?  hydrOXYzine (ATARAX/VISTARIL) 25 MG tablet, Take 2 tablets (50 mg total) by mouth 2 (two) times daily as needed. (Patient taking differently: Take 25 mg by mouth 2 (two) times daily.), Disp: 120 tablet, Rfl: 6 ?  potassium chloride SA (KLOR-CON) 20 MEQ tablet, Take 1 tablet (20 mEq total) by mouth daily., Disp: 30 tablet, Rfl: 0 ?  pregabalin (LYRICA) 25 MG capsule, Take 1 capsule (25 mg total) by mouth 2 (two) times daily., Disp: 60 capsule, Rfl: 2 ?  spironolactone (ALDACTONE) 50 MG tablet, TAKE 1 TABLET(50 MG) BY MOUTH EVERY MORNING, Disp: 90 tablet, Rfl: 3 ?  traZODone (DESYREL) 50 MG tablet, TAKE 1 TABLET(50 MG) BY MOUTH AT BEDTIME (Patient taking differently: at bedtime as needed.), Disp: 30 tablet, Rfl: 0 ?  ZOLMitriptan (ZOMIG) 2.5 MG tablet, Take 1 tablet (2.5 mg total) by mouth once for 1 dose. May repeat in 2 hours if headache persists or recurs., Disp: 20 tablet, Rfl: 3 ?Medication Side Effects: none ? ?Family Medical/ Social History: Changes? No ? ?MENTAL HEALTH EXAM: ? ?There were no vitals taken for  this visit.There is no height or weight on file to calculate BMI.  ?General Appearance: Casual and Neat  ?Eye Contact:  Good  ?Speech:  Clear and Coherent  ?Volume:  Normal  ?Mood:  NA  ?Affect:  Appropriate  ?Thought Process:  Coherent  ?Orientation:  Full (Time, Place, and Person)  ?Thought Content: Logical   ?Suicidal Thoughts:  Yes.  without intent/plan  ?Homicidal Thoughts:  No  ?Memory:  WNL  ?Judgement:  Good  ?Insight:  Good  ?Psychomotor Activity:  Decreased   ?Concentration:  Concentration: Fair  ?Recall:  Good  ?Fund of Knowledge: Poor  ?Language: Fair  ?Assets:  Desire for Improvement ?Financial Resources/Insurance ?Physical Health ?Resilience ?Social Support  ?ADL's:  Impaired  ?Cognition: Impaired,  Mild  ?Prognosis:  Fair  ? ? ?DIAGNOSES:  ?  ICD-10-CM   ?1. Generalized anxiety disorder  F41.1   ?  ?2. Suicidal ideation  R45.851   ?  ?3. Alcohol abuse  F10.10   ?  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  ?6. Sjogren's syndrome, with unspecified organ involvement ([place])  M35.00   ?  ?7. Peripheral vascular disease ([facility])  I73.9   ?  ?8. Major depressive disorder, recurrent episode, moderate ([facility])  F33.1   ?  ? ? ?Receiving Psychotherapy: No  ? ? ?RECOMMENDATIONS:  ? ? Greater than 50% of 40  min face to face interview with patient was spent on counseling and coordination of care. With her both of her parents present with consent, we discussed her recent hospitalization for alcohol induced Polyneuropathy.  I informed her that further treatment would be compromised due to her continued problems with ETOH abuse and related medication problems. I do not feel comfortable continuing to provide medication therapy until the ETOH is addressed, I am also concerned with her complications with liver damage. Her AST, ALT, and Bilirubin were abnormal, which they were improving last year. This can be attributed to increasing ETOH intake. I highly recommended that she seek supervised residential treatment to address alcohol but she is unwilling to stay away from home. II explained that I would require her complete at minimum an intensive outpatient program. I provided resources to contact to include: [facility] [place], [place] for IOP. I recommended that mother attend any consultation to provide collateral information. Patient  agreed and can reschedule appt with me if she desires after completion of a qualified program. She wants to continue her current medication for now and had adequate supply until further assessment.  ? ? Continue Lexapro 20 mg daily ?Continue Vraylar 1.5 mg daily.  ?Continue Auvelity 90/210 daily ?Continue Trazodone 50 mg at bedtime. May take half tablet if to somnolent in the am.  ?Recommended pt seek residential treatment or IOP.  ?Educated on good sleep hygiene ?Provided emergency contact info ?Discussed potential metabolic side effects associated with atypical antipsychotics, as well as potential risk for movement side effects. Advised pt to contact office if movement side effects occur.   ?Further explained risk of heavy ETOH and psychotropic medication. Explained how ETOH is a depressant and would further worsen her symptoms ?Provided emergency contact information and instructed mother that PT should be supervised if possible. Call 911 in emergency or if pt is in danger, threatening to harm self or others.  ?Reviewed PDMP ?  ?  ? ? ? ? ? ?  [name], NP  ?

## 2021-12-30 ENCOUNTER — Telehealth (HOSPITAL_COMMUNITY): Payer: Self-pay | Admitting: Professional

## 2021-12-30 ENCOUNTER — Telehealth: Payer: Self-pay | Admitting: Behavioral Health

## 2021-12-30 ENCOUNTER — Other Ambulatory Visit: Payer: Self-pay | Admitting: Behavioral Health

## 2021-12-30 NOTE — Telephone Encounter (Signed)
Pt LVM at 11:48a.  She said Aaron Edelman asked her to call him and let him know this info.  She has appt tomorrow at [facility] at 11am and she is planning on going to [facility]. ? ?No upcoming appts scheduled. ?

## 2021-12-30 NOTE — Telephone Encounter (Signed)
Thank you. This was noted.

## 2021-12-30 NOTE — Progress Notes (Signed)
Patient called in to inform she has appt scheduled with [facility] at 11:00 am on 12/31/2021 and also will be contacting [facility] for IOP assessment.  ?

## 2022-01-04 ENCOUNTER — Ambulatory Visit: Payer: 59 | Admitting: Psychiatry

## 2022-01-05 ENCOUNTER — Other Ambulatory Visit (HOSPITAL_COMMUNITY): Payer: Self-pay | Admitting: Physician Assistant

## 2022-01-05 DIAGNOSIS — G629 Polyneuropathy, unspecified: Secondary | ICD-10-CM

## 2022-01-07 ENCOUNTER — Ambulatory Visit (INDEPENDENT_AMBULATORY_CARE_PROVIDER_SITE_OTHER): Payer: 59 | Admitting: Psychiatry

## 2022-01-07 DIAGNOSIS — F411 Generalized anxiety disorder: Secondary | ICD-10-CM | POA: Diagnosis not present
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
## 2022-01-27 ENCOUNTER — Ambulatory Visit (INDEPENDENT_AMBULATORY_CARE_PROVIDER_SITE_OTHER): Payer: 59 | Admitting: Psychiatry

## 2022-01-27 ENCOUNTER — Encounter: Payer: Self-pay | Admitting: Behavioral Health

## 2022-01-27 ENCOUNTER — Ambulatory Visit (INDEPENDENT_AMBULATORY_CARE_PROVIDER_SITE_OTHER): Payer: 59 | Admitting: Behavioral Health

## 2022-01-27 DIAGNOSIS — F331 Major depressive disorder, recurrent, moderate: Secondary | ICD-10-CM

## 2022-01-27 DIAGNOSIS — I739 Peripheral vascular disease, unspecified: Secondary | ICD-10-CM

## 2022-01-27 DIAGNOSIS — F419 Anxiety disorder, unspecified: Secondary | ICD-10-CM | POA: Diagnosis not present

## 2022-01-27 DIAGNOSIS — F5105 Insomnia due to other mental disorder: Secondary | ICD-10-CM | POA: Diagnosis not present

## 2022-01-27 DIAGNOSIS — F411 Generalized anxiety disorder: Secondary | ICD-10-CM | POA: Diagnosis not present

## 2022-01-27 DIAGNOSIS — F1010 Alcohol abuse, uncomplicated: Secondary | ICD-10-CM

## 2022-01-27 DIAGNOSIS — M3500 Sicca syndrome, unspecified: Secondary | ICD-10-CM

## 2022-01-27 DIAGNOSIS — F99 Mental disorder, not otherwise specified: Secondary | ICD-10-CM

## 2022-01-27 DIAGNOSIS — G621 Alcoholic polyneuropathy: Secondary | ICD-10-CM

## 2022-01-27 MED ORDER — TRAZODONE HCL 100 MG PO TABS: 100.0000 mg | ORAL_TABLET | Freq: Every day | ORAL | 1 refills | Status: DC

## 2022-01-27 MED ORDER — CARIPRAZINE HCL 1.5 MG PO CAPS: 1.5000 mg | ORAL_CAPSULE | Freq: Every day | ORAL | 1 refills | Status: DC

## 2022-01-27 MED ORDER — ESCITALOPRAM OXALATE 20 MG PO TABS: 20.0000 mg | ORAL_TABLET | Freq: Every day | ORAL | 2 refills | Status: DC

## 2022-01-27 MED ORDER — HYDROXYZINE HCL 25 MG PO TABS: 25.0000 mg | ORAL_TABLET | Freq: Two times a day (BID) | ORAL | 1 refills | Status: AC

## 2022-01-27 MED ORDER — AUVELITY 45-105 MG PO TBCR: 1.0000 | EXTENDED_RELEASE_TABLET | Freq: Two times a day (BID) | ORAL | 3 refills | Status: DC

## 2022-01-27 NOTE — Progress Notes (Signed)
[facility] ? ?Patient ID: Jeanette Elliott,  ?MRN: [id number] ? ?PCP: Haydee Salter, MD ? ?Date of Evaluation: 01/27/2022 ?Time spent:30 minutes ? ?Chief Complaint:  ?Chief Complaint   ?Anxiety; Depression; Alcohol Problem; Family Problem; Follow-up; Medication Refill; Medication Problem; Patient Education ?  ? ? ?HISTORY/CURRENT STATUS: ?HPI ? ?41 year old female presented for follow up and medication management.  Her unsteady gait is improved today since last visit. Her speech is clear and concise. She is logical and sober. She says that she continues 3 days per week at the [facility] and participates in [place] daily. Says that she will be following up with PCP and Gastro soon and will have new labs for liver function. For now, she will continue with current medication regimen except for increase in Trazodone for sleep. Anxiety today is 4/10 and depression 3/10. She has been sleeping 6-7 hours per night but broken. She denies current mania, no psychosis, No current SI.  No HI. Patient verbally contracted for safety and will be staying with parents until she improves.  Will f/u with this office after she completes IOP.  ?  ?  ?Venlafaxine ?Wellbutrin  ?Paxil  ? ? ? ?Individual Medical History/ Review of Systems: Changes? :No  ? ?Allergies: Latex ? ?Current Medications:  ?Current Outpatient Medications:  ?  traZODone (DESYREL) 100 MG tablet, Take 1 tablet (100 mg total) by mouth at bedtime., Disp: 30 tablet, Rfl: 1 ?  albuterol (VENTOLIN HFA) 108 (90 Base) MCG/ACT inhaler, Inhale 2 puffs into the lungs every 6 (six) hours as needed (asthma attacks)., Disp: 8 g, Rfl: 1 ?  cariprazine (VRAYLAR) 1.5 MG capsule, Take 1 capsule (1.5 mg total) by mouth daily., Disp: 30 capsule, Rfl: 1 ?  cholecalciferol (VITAMIN D3) 25 MCG (1000 UNIT) tablet, Take 1,000 Units by mouth daily., Disp: , Rfl:  ?  Dextromethorphan-buPROPion ER (AUVELITY) 45-105 MG TBCR, Take 1 tablet by mouth 2 (two) times daily., Disp: 60 tablet,  Rfl: 3 ?  EPINEPHrine 0.3 mg/0.3 mL IJ SOAJ injection, Inject 0.3 mg into the muscle once as needed for anaphylaxis., Disp: 1 each, Rfl: 0 ?  escitalopram (LEXAPRO) 20 MG tablet, Take 1 tablet (20 mg total) by mouth daily., Disp: 90 tablet, Rfl: 2 ?  folic acid (FOLVITE) 1 MG tablet, TAKE 1 TABLET(1 MG) BY MOUTH DAILY, Disp: 90 tablet, Rfl: 1 ?  furosemide (LASIX) 20 MG tablet, Take 1 tablet (20 mg total) by mouth daily., Disp: 90 tablet, Rfl: 3 ?  hydrOXYzine (ATARAX) 25 MG tablet, Take 1 tablet (25 mg total) by mouth 2 (two) times daily., Disp: 60 tablet, Rfl: 1 ?  potassium chloride SA (KLOR-CON) 20 MEQ tablet, Take 1 tablet (20 mEq total) by mouth daily., Disp: 30 tablet, Rfl: 0 ?  pregabalin (LYRICA) 25 MG capsule, Take 1 capsule (25 mg total) by mouth 2 (two) times daily., Disp: 60 capsule, Rfl: 2 ?  spironolactone (ALDACTONE) 50 MG tablet, TAKE 1 TABLET(50 MG) BY MOUTH EVERY MORNING, Disp: 90 tablet, Rfl: 3 ?  traZODone (DESYREL) 50 MG tablet, TAKE 1 TABLET(50 MG) BY MOUTH AT BEDTIME (Patient taking differently: at bedtime as needed.), Disp: 30 tablet, Rfl: 0 ?  ZOLMitriptan (ZOMIG) 2.5 MG tablet, Take 1 tablet (2.5 mg total) by mouth once for 1 dose. May repeat in 2 hours if headache persists or recurs., Disp: 20 tablet, Rfl: 3 ?Medication Side Effects: none ? ?Family Medical/ Social History: Changes? No ? ?MENTAL HEALTH EXAM: ? ?There were no vitals  taken for this visit.There is no height or weight on file to calculate BMI.  ?General Appearance: Casual, Neat, and Well Groomed  ?Eye Contact:  Good  ?Speech:  Clear and Coherent  ?Volume:  Normal  ?Mood:  Anxious  ?Affect:  Depressed, Flat, and Anxious  ?Thought Process:  Coherent  ?Orientation:  Full (Time, Place, and Person)  ?Thought Content: Logical   ?Suicidal Thoughts:  No  ?Homicidal Thoughts:  No  ?Memory:  WNL  ?Judgement:  Fair  ?Insight:  Fair  ?Psychomotor Activity:  Normal  ?Concentration:  Concentration: Good  ?Recall:  Good  ?Fund of Knowledge:  Good  ?Language: Good  ?Assets:  Desire for Improvement ?Physical Health ?Resilience ?Social Support  ?ADL's:  Intact  ?Cognition: WNL  ?Prognosis:  Good  ? ? ?DIAGNOSES:  ?  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 F99   ?  ?2. Generalized anxiety disorder  F41.1 cariprazine (VRAYLAR) 1.5 MG capsule  ?  escitalopram (LEXAPRO) 20 MG tablet  ?  traZODone (DESYREL) 100 MG tablet  ?  Dextromethorphan-buPROPion ER (AUVELITY) 45-105 MG TBCR  ?  ?3. Major depressive disorder, recurrent episode, moderate (HCC)  F33.1 cariprazine (VRAYLAR) 1.5 MG capsule  ?  escitalopram (LEXAPRO) 20 MG tablet  ?  Dextromethorphan-buPROPion ER (AUVELITY) 45-105 MG TBCR  ?  ?4. Anxiety  F41.9 hydrOXYzine (ATARAX) 25 MG tablet  ?  ?5. Alcohol abuse  F10.10   ?  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  ?7. Sjogren's syndrome, with unspecified organ involvement ([place])  M35.00   ?  ?8. Peripheral vascular disease (HCC)  I73.9   ?  ? ? ?Receiving Psychotherapy: Yes  ? ? ?RECOMMENDATIONS:  ? ? Greater than 50% of 30  min face to face interview with patient was spent on counseling and coordination of care. She says that she wanted to follow up today but she will be completing IOP at the [facility] in June. She also is participating in [place] daily. I refinforced that me continuing to treat her with medication would be conditional with the understanding that she remain in a treatment program or AA and consume ETOH while being treated with psychotropic medications.  I am still concerned with her complications with liver damage. She agrees to follow up with gastro and her PCP for regular labs and follow up for tx. . She wants to continue her current medication for now and had adequate supply until further assessment.  ?  ? Continue Lexapro 20 mg daily ?Continue Vraylar 1.5 mg daily.  ?Continue Auvelity 90/210 daily ?Increased Trazodone to 100 mg at bedtime. May take half tablet if to  somnolent in the am.  ?Will continue in residential treatment or IOP at [facility] and remain in [place].  ?Educated on good sleep hygiene ?Provided emergency contact info ?Discussed potential metabolic side effects associated with atypical antipsychotics, as well as potential risk for movement side effects. Advised pt to contact office if movement side effects occur.   ?Further explained risk of heavy ETOH and psychotropic medication. Explained how ETOH is a depressant and would further worsen her symptoms ?Provided emergency contact information and instructed mother that PT should be supervised if possible. Call 911 in emergency or if pt is in danger, threatening to harm self or others.  ?Reviewed PDMP ?  ?  ? ? ? ? ?[name], NP  ?

## 2022-01-27 NOTE — Progress Notes (Signed)
[facility] Counselor/Therapist Progress Note  Patient ID: Jeanette Elliott, MRN: [id number],    Date: 01/27/2022  Time Spent: 51 minutes start time 2:05 PM end time 2:56 PM  Treatment Type: Individual Therapy  Reported Symptoms: focusing issues, depression, memory issues, anxiety, racing thoughts, sleep issues  Mental Status Exam:  Appearance:   Casual and Neat     Behavior:  Appropriate  Motor:  Normal  Speech/Language:   Normal Rate  Affect:  Labile  Mood:  labile  Thought process:  Pt reports racing thoughts  Thought content:    WNL  Sensory/Perceptual disturbances:    WNL  Orientation:  oriented to person, place, time/date, and situation  Attention:  Good  Concentration:  Good  Memory:  Immediate;   [place] of knowledge:   Good  Insight:    Fair  Judgment:   Good  Impulse Control:  Fair   Risk Assessment: Danger to Self:  No Self-injurious Behavior: No Danger to Others: No Duty to Warn:no Physical Aggression / Violence:No  Access to Firearms a concern: No  Gang Involvement:No   Subjective: Patient was present for session.  She shared she is still [facility] and she feels it is helping her.  She is working on 90 in 90 in [place].  She has gotten a sponsor and she is finding some people to connect with in [place].  Patient was encouraged to think through some coping skills that she has learned in time and treatment that she can utilize when she feels triggered.  Patient shared she is realizing she needs to keep her brain engaged in positive activity and going to the meetings helps but she is also starting to make plans for her house and figuring out what she can do to make it feel brighter and happier.  Patient also shared she is still mourning to have a baby at some point.  Discussed the importance of her doing what she is doing to get her body healthy and feeling better mentally before she thinks about that future.  Discussed the importance of continuing to  work through things with her parents.  She shared that they are coming to family meetings and that has been beneficial.  Patient is also discussing potential job options to see what she can do from home to help bring in some income so she can feel better about herself as well.  Patient was encouraged to continue in group and another appointment was scheduled prior to her being released from the [facility].  Interventions: Cognitive Behavioral Therapy and Solution-Oriented/Positive Psychology  Diagnosis:   ICD-10-CM   1. Generalized anxiety disorder  F41.1       Plan: Patient is to use CBT and coping skills to decrease anxiety symptoms.  Patient is to do a brain dump at night and write as quickly as she can for a few minutes then close the journal.  Patient is to consider using brain spotting bilateral music to calm herself as needed.  Patient is to start going to the gym regularly and exercising.  Patient is also to [facility] and AA meetings.  Patient is to work on her self talk and focus on the things that she wants in life and ways to achieve those goals.  Patient is to take medication as directed. Long term goal: Resolve the core conflict that is the source of anxiety Short term goal: Identify the  major life conflicts from the past and present that form the basis for present anxiety  [name], [facility]

## 2022-01-28 ENCOUNTER — Ambulatory Visit: Payer: 59 | Admitting: Behavioral Health

## 2022-02-10 ENCOUNTER — Ambulatory Visit: Payer: 59 | Admitting: Psychiatry

## 2022-02-17 ENCOUNTER — Ambulatory Visit: Payer: 59 | Admitting: Psychiatry

## 2022-02-24 ENCOUNTER — Ambulatory Visit: Payer: 59 | Admitting: Psychiatry

## 2022-03-03 ENCOUNTER — Ambulatory Visit: Payer: 59 | Admitting: Psychiatry

## 2022-03-10 ENCOUNTER — Encounter: Payer: Self-pay | Admitting: Family Medicine

## 2022-03-10 ENCOUNTER — Ambulatory Visit (INDEPENDENT_AMBULATORY_CARE_PROVIDER_SITE_OTHER): Payer: 59 | Admitting: Family Medicine

## 2022-03-10 VITALS — BP 112/68 | HR 95 | Temp 97.0°F | Ht 67.0 in | Wt 156.8 lb

## 2022-03-10 DIAGNOSIS — F333 Major depressive disorder, recurrent, severe with psychotic symptoms: Secondary | ICD-10-CM | POA: Diagnosis not present

## 2022-03-10 DIAGNOSIS — G621 Alcoholic polyneuropathy: Secondary | ICD-10-CM | POA: Diagnosis not present

## 2022-03-10 DIAGNOSIS — F1021 Alcohol dependence, in remission: Secondary | ICD-10-CM

## 2022-03-10 DIAGNOSIS — K7030 Alcoholic cirrhosis of liver without ascites: Secondary | ICD-10-CM | POA: Diagnosis not present

## 2022-03-10 DIAGNOSIS — J4520 Mild intermittent asthma, uncomplicated: Secondary | ICD-10-CM

## 2022-03-10 NOTE — Progress Notes (Signed)
[facility] [address] [place] [address] Dept: [phone] Dept Fax: [phone]  Chronic Care Office Visit  Subjective:    Patient ID: Jeanette Elliott, female    DOB: 1981/04/30, 41 y.o..   MRN: [id number]  Chief Complaint  Patient presents with   Follow-up    3 month f/u.  No concerns.   Fasting today.      History of Present Illness:  Patient is in today for reassessment of chronic medical issues.  [name] has a history of alcohol dependence. She remains in remission at this point, maintaining her sobriety. Her alcoholism had been complicated by cirrhosis. However, she has had marked improvement in her hepatic and hematologic abnormalities. She recently changed jobs from working at [employer]. She is now working at [employer] at [employer] and feels this will work well for her.   [name] sees Dr. Felecia Shelling (neurology) related to a peripheral neuropathy. 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 He is managing her with gabapentin. [name] still notes numbness in her feet in a stocking pattern, but also complains of pain.  [name] has a history of asthma. She notes she has had 3 asthma flares in the past 2 months. She does use her albuterol inhaler in response.   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) 03/04/2021   Anxiety    Cirrhosis ([place]) 02/02/2021   S/P thoracentesis    Closed fracture of rib of left side with routine healing    Fall    Dysplasia of cervix, low grade (CIN 1) 09/22/2016   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   Sjogren's syndrome ([place]) 07/20/2016   Past Surgical History:  Procedure Laterality Date   IR PARACENTESIS  02/03/2021   IR THORACENTESIS ASP PLEURAL SPACE W/IMG GUIDE  01/16/2021   ORIF FINGER / THUMB FRACTURE Left    Thumb   SUBMANDIBULAR GLAND EXCISION     TONSILLECTOMY AND ADENOIDECTOMY     Family History  Problem Relation Age of Onset   Arthritis Mother    Heart disease Father    Cancer Father        Prostate   Arthritis Father    Heart disease Maternal Grandmother    Stroke Maternal Grandmother    Depression Maternal Grandmother    Alzheimer's disease Maternal Grandfather    Heart disease Paternal Grandfather    Heart disease Paternal Uncle    Colon cancer Neg Hx    Esophageal cancer Neg Hx    Outpatient Medications Prior to Visit  Medication Sig Dispense Refill   albuterol (VENTOLIN HFA) 108 (90 Base) MCG/ACT inhaler Inhale 2 puffs into the lungs every 6 (six) hours as needed (asthma attacks). 8 g 1   Beta Carotene (VITAMIN A) 25000 UNIT capsule Take 25,000 Units by mouth daily.     cariprazine (VRAYLAR) 1.5 MG capsule Take 1 capsule (1.5  mg total) by mouth daily. 30 capsule 1   cholecalciferol (VITAMIN D3) 25 MCG (1000 UNIT) tablet Take 1,000 Units by mouth daily.     cyanocobalamin (,VITAMIN B-12,) 1000 MCG/ML injection SMARTSIG:1 Milliliter(s) IM Every 3 Days     Dextromethorphan-buPROPion ER (AUVELITY) 45-105 MG TBCR Take 1 tablet by mouth 2 (two) times daily. 60 tablet 3   EPINEPHrine 0.3 mg/0.3 mL IJ SOAJ injection Inject 0.3 mg into the muscle once as needed for anaphylaxis. 1 each 0   escitalopram (LEXAPRO) 20 MG tablet Take 1 tablet (20 mg total) by mouth daily. 90 tablet 2    folic acid (FOLVITE) 1 MG tablet TAKE 1 TABLET(1 MG) BY MOUTH DAILY 90 tablet 1   furosemide (LASIX) 20 MG tablet Take 1 tablet (20 mg total) by mouth daily. 90 tablet 3   hydrOXYzine (ATARAX) 25 MG tablet Take 1 tablet (25 mg total) by mouth 2 (two) times daily. 60 tablet 1   naltrexone (DEPADE) 50 MG tablet Take 50 mg by mouth daily.     potassium chloride SA (KLOR-CON) 20 MEQ tablet Take 1 tablet (20 mEq total) by mouth daily. 30 tablet 0   pregabalin (LYRICA) 25 MG capsule Take 1 capsule (25 mg total) by mouth 2 (two) times daily. 60 capsule 2   spironolactone (ALDACTONE) 50 MG tablet TAKE 1 TABLET(50 MG) BY MOUTH EVERY MORNING 90 tablet 3   traZODone (DESYREL) 50 MG tablet TAKE 1 TABLET(50 MG) BY MOUTH AT BEDTIME (Patient taking differently: at bedtime as needed.) 30 tablet 0   ZOLMitriptan (ZOMIG) 2.5 MG tablet Take 1 tablet (2.5 mg total) by mouth once for 1 dose. May repeat in 2 hours if headache persists or recurs. 20 tablet 3   traZODone (DESYREL) 100 MG tablet Take 1 tablet (100 mg total) by mouth at bedtime. (Patient not taking: Reported on 03/10/2022) 30 tablet 1   No facility-administered medications prior to visit.   Allergies  Allergen Reactions   Latex Itching and Rash     Objective:   Today's Vitals   03/10/22 0909  BP: 112/68  Pulse: 95  Temp: (!) 97 F (36.1 C)  TempSrc: Temporal  SpO2: 96%  Weight: 156 lb 12.8 oz (71.1 kg)  Height: 5' 7"  (1.702 m)   Body mass index is 24.56 kg/m.   General: Well developed, well nourished. No acute distress. Lungs: Clear to auscultation bilaterally. No wheezing, rales or rhonchi. Extremities: Trace edema. Psych: Alert and oriented. Normal mood and affect.  Health Maintenance Due  Topic Date Due   PAP SMEAR-Modifier  Never done        03/10/2022    9:37 AM 12/09/2021   10:31 AM 04/02/2021    1:52 PM  Depression screen PHQ 2/9  Decreased Interest 2 3 2   Down, Depressed, Hopeless 2 3 2   PHQ - 2 Score 4 6 4   Altered  sleeping 2 2 2   Tired, decreased energy 3 3 2   Change in appetite 3 3 2   Feeling bad or failure about yourself  2 3 3   Trouble concentrating 2 2 2   Moving slowly or fidgety/restless 1 3 2   Suicidal thoughts 1 1 2   PHQ-9 Score 18 23 19   Difficult doing work/chores Very difficult Somewhat difficult Extremely dIfficult   Assessment & Plan:   1. Alcohol dependence in remission ([place]) Maintaining sobriety. Currently on naltrexone 50 mg daily. Attending AA and IOP sessions.  2. MDD (major depressive disorder), recurrent, severe, with psychosis ([place]) Stable with  improving PHQ scores. Continue work with behavioral health.  3. 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  4. Alcoholic cirrhosis of liver without ascites ([place]) Improved now that she is maintaining sobriety.  5. Mild intermittent asthma without complication Continue albuterol 2 puffs every 6 hours as needed for wheezing.   Return in about 4 months (around 07/10/2022) for Reassessment.   Haydee Salter, MD

## 2022-03-11 ENCOUNTER — Ambulatory Visit (INDEPENDENT_AMBULATORY_CARE_PROVIDER_SITE_OTHER): Payer: 59 | Admitting: Psychiatry

## 2022-03-11 DIAGNOSIS — F411 Generalized anxiety disorder: Secondary | ICD-10-CM | POA: Diagnosis not present

## 2022-03-11 NOTE — Progress Notes (Signed)
[facility] Counselor/Therapist Progress Note  Patient ID: Jeanette Elliott, MRN: [id number],    Date: 03/11/2022  Time Spent: 48 minutes start time 8:11 AM end time 8:59 AM  Treatment Type: Individual Therapy  Reported Symptoms: fatigue, anxiety, sadness, sleep issues, rumination  Mental Status Exam:  Appearance:   Well Groomed     Behavior:  Appropriate  Motor:  Normal  Speech/Language:   Normal Rate  Affect:  Appropriate  Mood:  anxious  Thought process:  normal  Thought content:    WNL  Sensory/Perceptual disturbances:    WNL  Orientation:  oriented to person, place, time/date, and situation  Attention:  Good  Concentration:  Good  Memory:  WNL  Fund of knowledge:   Good  Insight:    Good  Judgment:   Good  Impulse Control:  Good   Risk Assessment: Danger to Self:  No Self-injurious Behavior: No Danger to Others: No Duty to Warn:no Physical Aggression / Violence:No  Access to Firearms a concern: No  Gang Involvement:No   Subjective: Patient was present for session. She shared she has started a new job and that is going well.  She is continuing in IOP and AA.  Things are going better overall.  She shared that her health is getting better as well which is positive. Things are moving in a good direction with Darryl.  Patient stated there is still another person that she talks to once in a while.  As patient discussed her feelings she was able to admit that she still ruminates on a lot of the negative occurrences from her past.  Patient was encouraged just to focus on one of the relationships that she ruminates.  She discussed her relationship with Cherlynn Kaiser who was her first boyfriend.  Discussed different reasons why the relationship ended and the importance of reminding herself that it was a positive and and she does not have to feel guilt or shame regarding what happened because she did not do anything wrong.  Patient was also encouraged to continue working on getting  stronger physically and reminding herself that things are moving in a positive direction.  Patient did report that her job has helped her and she is starting to form some friendships there so she is hopeful that we will continue to make her feel better and be able to have more to think about other than rumination on things from the past.  Interventions: Cognitive Behavioral Therapy and Solution-Oriented/Positive Psychology  Diagnosis:   ICD-10-CM   1. Generalized anxiety disorder  F41.1       Plan: Patient is to use CBT and coping skills to decrease anxiety symptoms.  Patient is to do a brain dump at night and write as quickly as she can for a few minutes then close the journal.  Patient is to consider using brain spotting bilateral music to calm herself as needed.  Patient is to start going to the gym regularly and exercising.  Patient is also to [facility] and AA meetings.  Patient is to work on her self talk and focus on the things that she wants in life and ways to achieve those goals.  Patient is to take medication as directed. Long term goal: Resolve the core conflict that is the source of anxiety Short term goal: Identify the major life conflicts from the past and present that form the basis for present anxiety  Jeanette Elliott, [facility]

## 2022-03-23 ENCOUNTER — Ambulatory Visit (INDEPENDENT_AMBULATORY_CARE_PROVIDER_SITE_OTHER): Payer: 59 | Admitting: Psychiatry

## 2022-03-23 DIAGNOSIS — F411 Generalized anxiety disorder: Secondary | ICD-10-CM

## 2022-03-23 NOTE — Progress Notes (Signed)
      [facility] Counselor/Therapist Progress Note  Patient ID: Jeanette Elliott, MRN: [id number],    Date: 03/23/2022  Time Spent: 50 minutes start time 8:08 AM end time 8:58 AM  Treatment Type: Individual Therapy  Reported Symptoms: anxiety, sadness, triggered responses, rumination  Mental Status Exam:  Appearance:   Casual     Behavior:  Appropriate  Motor:  Normal  Speech/Language:   Normal Rate  Affect:  Appropriate  Mood:  anxious  Thought process:  normal  Thought content:    WNL  Sensory/Perceptual disturbances:    WNL  Orientation:  oriented to person, place, time/date, and situation  Attention:  Good  Concentration:  Good  Memory:  WNL  Fund of knowledge:   Good  Insight:    Good  Judgment:   Good  Impulse Control:  Good   Risk Assessment: Danger to Self:  No Self-injurious Behavior: No Danger to Others: No Duty to Warn:no Physical Aggression / Violence:No  Access to Firearms a concern: No  Gang Involvement:No   Subjective: Patient was present for session. She is still going to [place] meetings and IOP and that is going well.  Patient went on to share that she is still struggling with morning to have more of a commitment with the relationship she is currently involved.  Patient stated that there is somebody else who is also interested in her but she is seeing lots of red flags with him.  Gave patient the opportunity to discuss the different situations and encouraged her to recognize what is healthiest for her and to work on those things.  Patient was able to recognize that she wants to continue pursuing the person that she is happy in the relationship.  Patient reported she is working and has started finding some people she can connect with at work.  Was encouraged to recognize the positive things that are happening and to work on staying focused on the positives as she continues to improve.   Interventions: Solution-Oriented/Positive Psychology and  Insight-Oriented  Diagnosis:   ICD-10-CM   1. Generalized anxiety disorder  F41.1       Plan: Patient is to use CBT and coping skills to decrease anxiety symptoms.  Patient is to do a brain dump at night and write as quickly as she can for a few minutes then close the journal.  Patient is to consider using brain spotting bilateral music to calm herself as needed.  Patient is to start going to the gym regularly and exercising.  Patient is also to [facility] groups and AA meetings.  Patient is to work on her self talk and focus on the things that she wants in life and ways to achieve those goals.  Patient is to take medication as directed. Long term goal: Resolve the core conflict that is the source of anxiety Short term goal: Identify the major life conflicts from the past and present that form the basis for present anxiety  [name], [facility]

## 2022-03-30 ENCOUNTER — Encounter: Payer: Self-pay | Admitting: Behavioral Health

## 2022-03-30 ENCOUNTER — Ambulatory Visit (INDEPENDENT_AMBULATORY_CARE_PROVIDER_SITE_OTHER): Payer: 59 | Admitting: Behavioral Health

## 2022-03-30 DIAGNOSIS — F411 Generalized anxiety disorder: Secondary | ICD-10-CM

## 2022-03-30 DIAGNOSIS — F99 Mental disorder, not otherwise specified: Secondary | ICD-10-CM | POA: Diagnosis not present

## 2022-03-30 DIAGNOSIS — F5105 Insomnia due to other mental disorder: Secondary | ICD-10-CM | POA: Diagnosis not present

## 2022-03-30 DIAGNOSIS — F331 Major depressive disorder, recurrent, moderate: Secondary | ICD-10-CM

## 2022-03-30 MED ORDER — AUVELITY 45-105 MG PO TBCR: 1.0000 | EXTENDED_RELEASE_TABLET | Freq: Two times a day (BID) | ORAL | 3 refills | Status: DC

## 2022-03-30 MED ORDER — CARIPRAZINE HCL 1.5 MG PO CAPS: 1.5000 mg | ORAL_CAPSULE | Freq: Every day | ORAL | 2 refills | Status: DC

## 2022-03-30 MED ORDER — TRAZODONE HCL 50 MG PO TABS: 50.0000 mg | ORAL_TABLET | Freq: Every day | ORAL | 2 refills | Status: DC

## 2022-03-30 MED ORDER — ESCITALOPRAM OXALATE 20 MG PO TABS: 20.0000 mg | ORAL_TABLET | Freq: Every day | ORAL | 2 refills | Status: DC

## 2022-03-30 NOTE — Progress Notes (Signed)
[facility]  Patient ID: Jeanette Elliott,  MRN: [id number]  PCP: Haydee Salter, MD  Date of Evaluation: 03/30/2022 Time spent:30 minutes  Chief Complaint:  Chief Complaint   Alcohol Problem; Depression; Anxiety; Follow-up; Medication Refill     HISTORY/CURRENT STATUS: HPI  41 year old female presented for follow up and medication management.   Her speech is clear and concise today. She is logical and sober. She is continuing with IOP at [facility]. She says that she continues 3 days per week at the [facility] and participates in [facility] daily. Says that she will be following up with PCP and Gastro soon and will have new labs for liver function. Understands at least every 6 months to monitor. She will continue with current medications and no changes this visit.  Anxiety today is 3/10 and depression 2/10. She has been sleeping 7-8  hours per night with Trazodone. She denies current mania, no psychosis, No current SI.  No HI. Patient verbally contracted for safety and will be staying with parents until she improves.  Will f/u with this office after she completes IOP.      Venlafaxine Wellbutrin  Paxil     Individual Medical History/ Review of Systems: Changes? :No   Allergies: Latex  Current Medications:  Current Outpatient Medications:    traZODone (DESYREL) 50 MG tablet, Take 1 tablet (50 mg total) by mouth at bedtime., Disp: 30 tablet, Rfl: 2   albuterol (VENTOLIN HFA) 108 (90 Base) MCG/ACT inhaler, Inhale 2 puffs into the lungs every 6 (six) hours as needed (asthma attacks)., Disp: 8 g, Rfl: 1   Beta Carotene (VITAMIN A) 25000 UNIT capsule, Take 25,000 Units by mouth daily., Disp: , Rfl:    cariprazine (VRAYLAR) 1.5 MG capsule, Take 1 capsule (1.5 mg total) by mouth daily., Disp: 30 capsule, Rfl: 2   cholecalciferol (VITAMIN D3) 25 MCG (1000 UNIT) tablet, Take 1,000 Units by mouth daily., Disp: , Rfl:    cyanocobalamin (,VITAMIN B-12,) 1000 MCG/ML injection,  SMARTSIG:1 Milliliter(s) IM Every 3 Days, Disp: , Rfl:    Dextromethorphan-buPROPion ER (AUVELITY) 45-105 MG TBCR, Take 1 tablet by mouth 2 (two) times daily., Disp: 60 tablet, Rfl: 3   EPINEPHrine 0.3 mg/0.3 mL IJ SOAJ injection, Inject 0.3 mg into the muscle once as needed for anaphylaxis., Disp: 1 each, Rfl: 0   escitalopram (LEXAPRO) 20 MG tablet, Take 1 tablet (20 mg total) by mouth daily., Disp: 90 tablet, Rfl: 2   folic acid (FOLVITE) 1 MG tablet, TAKE 1 TABLET(1 MG) BY MOUTH DAILY, Disp: 90 tablet, Rfl: 1   furosemide (LASIX) 20 MG tablet, Take 1 tablet (20 mg total) by mouth daily., Disp: 90 tablet, Rfl: 3   hydrOXYzine (ATARAX) 25 MG tablet, Take 1 tablet (25 mg total) by mouth 2 (two) times daily., Disp: 60 tablet, Rfl: 1   naltrexone (DEPADE) 50 MG tablet, Take 50 mg by mouth daily., Disp: , Rfl:    potassium chloride SA (KLOR-CON) 20 MEQ tablet, Take 1 tablet (20 mEq total) by mouth daily., Disp: 30 tablet, Rfl: 0   pregabalin (LYRICA) 25 MG capsule, Take 1 capsule (25 mg total) by mouth 2 (two) times daily., Disp: 60 capsule, Rfl: 2   spironolactone (ALDACTONE) 50 MG tablet, TAKE 1 TABLET(50 MG) BY MOUTH EVERY MORNING, Disp: 90 tablet, Rfl: 3   traZODone (DESYREL) 50 MG tablet, TAKE 1 TABLET(50 MG) BY MOUTH AT BEDTIME (Patient taking differently: at bedtime as needed.), Disp: 30 tablet, Rfl: 0  ZOLMitriptan (ZOMIG) 2.5 MG tablet, Take 1 tablet (2.5 mg total) by mouth once for 1 dose. May repeat in 2 hours if headache persists or recurs., Disp: 20 tablet, Rfl: 3 Medication Side Effects: none  Family Medical/ Social History: Changes? No  MENTAL HEALTH EXAM:  There were no vitals taken for this visit.There is no height or weight on file to calculate BMI.  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 Generalized anxiety disorder  F41.1 cariprazine (VRAYLAR) 1.5 MG capsule    escitalopram (LEXAPRO) 20 MG tablet    Dextromethorphan-buPROPion ER (AUVELITY) 45-105 MG TBCR    3. Major depressive disorder, recurrent episode, moderate (HCC)  F33.1 cariprazine (VRAYLAR) 1.5 MG capsule    escitalopram (LEXAPRO) 20 MG tablet    Dextromethorphan-buPROPion ER (AUVELITY) 45-105 MG TBCR      Receiving Psychotherapy: Yes  [facility], IOP,  [name] at [facility]:    Greater than 50% of 30  min face to face interview with patient was spent on counseling and coordination of care. She is continuing in IOP at [facility] and also participating in [place].  She agrees to follow up with gastro and her PCP for regular labs and follow up for tx and monitoring of liver fx. She wants to continue her current medication for now and had adequate supply until further assessment.     Continue Lexapro 20 mg daily Continue Vraylar 1.5 mg daily.  Continue Auvelity 90/210 daily Continue Trazodone 100 mg at bedtime. May take half tablet if to somnolent in the am.  Will continue in residential treatment or IOP at [facility] and remain in [place].  Educated on good sleep hygiene Provided emergency contact info Discussed potential metabolic side effects associated with atypical antipsychotics, as well as potential risk for movement side effects. Advised pt to contact office if movement side effects occur.   Further explained risk of heavy ETOH  and psychotropic medication. Explained how ETOH is a depressant and would further worsen her symptoms Provided emergency contact information and instructed mother that PT should be supervised if possible. Call 911 in emergency or if pt is in danger, threatening to harm self or others.  Reviewed PDMP            [name], NP

## 2022-04-06 ENCOUNTER — Ambulatory Visit: Payer: 59 | Admitting: Neurology

## 2022-04-06 ENCOUNTER — Telehealth: Payer: Self-pay | Admitting: Neurology

## 2022-04-06 NOTE — Telephone Encounter (Signed)
Pt called and said she has a fever of 104 and can't make her appointment this morning.

## 2022-04-07 ENCOUNTER — Ambulatory Visit (INDEPENDENT_AMBULATORY_CARE_PROVIDER_SITE_OTHER): Payer: 59 | Admitting: Family Medicine

## 2022-04-07 VITALS — BP 118/76 | HR 105 | Temp 96.6°F | Ht 67.0 in | Wt 153.6 lb

## 2022-04-07 DIAGNOSIS — R309 Painful micturition, unspecified: Secondary | ICD-10-CM

## 2022-04-07 DIAGNOSIS — J029 Acute pharyngitis, unspecified: Secondary | ICD-10-CM

## 2022-04-07 DIAGNOSIS — N12 Tubulo-interstitial nephritis, not specified as acute or chronic: Secondary | ICD-10-CM | POA: Diagnosis not present

## 2022-04-07 DIAGNOSIS — R509 Fever, unspecified: Secondary | ICD-10-CM | POA: Diagnosis not present

## 2022-04-07 LAB — POCT URINALYSIS DIPSTICK
Bilirubin, UA: NEGATIVE
Blood, UA: POSITIVE
Glucose, UA: NEGATIVE
Nitrite, UA: POSITIVE
Protein, UA: POSITIVE — AB
Spec Grav, UA: 1.015 (ref 1.010–1.025)
Urobilinogen, UA: 0.2 E.U./dL
pH, UA: 6 (ref 5.0–8.0)

## 2022-04-07 LAB — POC COVID19 BINAXNOW: SARS Coronavirus 2 Ag: NEGATIVE

## 2022-04-07 LAB — POCT RAPID STREP A (OFFICE): Rapid Strep A Screen: NEGATIVE

## 2022-04-07 LAB — POCT URINE PREGNANCY: Preg Test, Ur: NEGATIVE

## 2022-04-07 MED ORDER — LIDOCAINE HCL 1 % IJ SOLN
1.0000 g | Freq: Once | INTRAMUSCULAR | Status: AC
  Administered 2022-04-07: 1 g via INTRAMUSCULAR

## 2022-04-07 MED ORDER — CEPHALEXIN 500 MG PO CAPS: 500.0000 mg | ORAL_CAPSULE | Freq: Two times a day (BID) | ORAL | 0 refills | Status: DC

## 2022-04-07 NOTE — Assessment & Plan Note (Addendum)
Associated with other URI symptoms Likely viral Recommended supportive care including OTC cough syrups, lozenges, and other medications as needed

## 2022-04-07 NOTE — Patient Instructions (Signed)
Take cephalexin as presribed Go to ED if you have severe abdominal pain Take robitussin for cough, cepacal for sore throat

## 2022-04-07 NOTE — Progress Notes (Signed)
Assessment/Plan:   Problem List Items Addressed This Visit       Genitourinary   Pyelonephritis    UA positive for leuks and nitrate CVA tenderness on right Ceftriaxone 1 mg x 1 IM Keflex 500 mg twice daily for 10 days Urine culture sent Return precautions discussed      Relevant Medications   cephALEXin (KEFLEX) 500 MG capsule   Other Relevant Orders   Urine Culture     Other   Sore throat    Associated with other URI symptoms Likely viral Recommended supportive care including OTC cough syrups, lozenges, and other medications as needed      Relevant Orders   POCT rapid strep A (Completed)   Other Visit Diagnoses     Urinary pain    -  Primary   Relevant Orders   POCT Urinalysis Dipstick (Completed)   POCT urine pregnancy (Completed)   Fever, unspecified fever cause       Relevant Orders   POC COVID-19 BinaxNow (Completed)          Subjective:  HPI:  Jeanette Elliott is a 41 y.o. female who has Fall; Closed fracture of rib of left side with routine healing; S/P thoracentesis; Cirrhosis ([place]); Anxiety; Alcohol dependence in remission ([place]); TMJ (temporomandibular joint syndrome); Sjogren's syndrome ([place]); Duodenal ulcer; Asthma; Hymenoptera allergy; B12 deficiency; Normocytic anemia; Dysplasia of cervix, low grade (CIN 1); Hip impingement syndrome, left; Sacroiliac joint dysfunction of right side; Peripheral vascular disease ([place]); Osteopenia determined by x-ray; Cholelithiasis; Goiter; Suicidal ideation; MDD (major depressive disorder), recurrent, severe, with psychosis ([place]); 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   She  has a past medical history of Asthma, Elevated liver enzymes, Jaundice, and Sjogren's syndrome ([place])..   She presents with chief complaint of Cough (Cough, sneezing, fever started couple of days ago/kidney pain started this past weekend, taking Ibuprofen (h/o kidney failure)/Red bumps on legs  and arms. Not itchy. Started 2 wks ago./Would like a pregnancy test today. ) .   Patient complains of sore throat. Associated symptoms include f cough, sneezing.Onset of symptoms was 2 weeks ago, unchanged since that time. She is drinking plenty of fluids. She has not had recent close exposure to someone with proven streptococcal pharyngititis.     Patient has also associated flank pain that started 4 days ago, and dysuria.  She does have a h/o kidney stones.  She reports having a fever up to 104, but this was improved with ibuprofen 400 mg.    Patient endorses chronic SOB, but not worse than normal.  Patient denies chest pain, nausea, vomiting, wheezing.   Past Surgical History:  Procedure Laterality Date   IR PARACENTESIS  02/03/2021   IR THORACENTESIS ASP PLEURAL SPACE W/IMG GUIDE  01/16/2021   ORIF FINGER / THUMB FRACTURE Left    Thumb   SUBMANDIBULAR GLAND EXCISION     TONSILLECTOMY AND ADENOIDECTOMY      Outpatient Medications Prior to Visit  Medication Sig Dispense Refill   albuterol (VENTOLIN HFA) 108 (90 Base) MCG/ACT inhaler Inhale 2 puffs into the lungs every 6 (six) hours as needed (asthma attacks). 8 g 1   Beta Carotene (VITAMIN A) 25000 UNIT capsule Take 25,000 Units by mouth daily.     cholecalciferol (VITAMIN D3) 25 MCG (1000 UNIT) tablet Take 1,000 Units by mouth daily.     cyanocobalamin (,VITAMIN B-12,) 1000 MCG/ML injection SMARTSIG:1 Milliliter(s) IM Every 3 Days     Dextromethorphan-buPROPion ER (AUVELITY)  45-105 MG TBCR Take 1 tablet by mouth 2 (two) times daily. 60 tablet 3   EPINEPHrine 0.3 mg/0.3 mL IJ SOAJ injection Inject 0.3 mg into the muscle once as needed for anaphylaxis. 1 each 0   escitalopram (LEXAPRO) 20 MG tablet Take 1 tablet (20 mg total) by mouth daily. 90 tablet 2   folic acid (FOLVITE) 1 MG tablet TAKE 1 TABLET(1 MG) BY MOUTH DAILY 90 tablet 1   furosemide (LASIX) 20 MG tablet Take 1 tablet (20 mg total) by mouth daily. 90 tablet 3    hydrOXYzine (ATARAX) 25 MG tablet Take 1 tablet (25 mg total) by mouth 2 (two) times daily. 60 tablet 1   naltrexone (DEPADE) 50 MG tablet Take 50 mg by mouth daily.     potassium chloride SA (KLOR-CON) 20 MEQ tablet Take 1 tablet (20 mEq total) by mouth daily. 30 tablet 0   pregabalin (LYRICA) 25 MG capsule Take 1 capsule (25 mg total) by mouth 2 (two) times daily. 60 capsule 2   spironolactone (ALDACTONE) 50 MG tablet TAKE 1 TABLET(50 MG) BY MOUTH EVERY MORNING 90 tablet 3   traZODone (DESYREL) 50 MG tablet TAKE 1 TABLET(50 MG) BY MOUTH AT BEDTIME (Patient taking differently: at bedtime as needed.) 30 tablet 0   traZODone (DESYREL) 50 MG tablet Take 1 tablet (50 mg total) by mouth at bedtime. 30 tablet 2   ZOLMitriptan (ZOMIG) 2.5 MG tablet Take 1 tablet (2.5 mg total) by mouth once for 1 dose. May repeat in 2 hours if headache persists or recurs. 20 tablet 3   cariprazine (VRAYLAR) 1.5 MG capsule Take 1 capsule (1.5 mg total) by mouth daily. 30 capsule 2   No facility-administered medications prior to visit.    Family History  Problem Relation Age of Onset   Arthritis Mother    Heart disease Father    Cancer Father        Prostate   Arthritis Father    Heart disease Maternal Grandmother    Stroke Maternal Grandmother    Depression Maternal Grandmother    Alzheimer's disease Maternal Grandfather    Heart disease Paternal Grandfather    Heart disease Paternal Uncle    Colon cancer Neg Hx    Esophageal cancer Neg Hx     Social History   Socioeconomic History   Marital status: Divorced    Spouse name: Not on file   Number of children: Not on file   Years of education: BA   Highest education level: Not on file  Occupational History   Occupation: Disabled    Comment: Prior Training and development officer  Tobacco Use   Smoking status: Never   Smokeless tobacco: Never  Vaping Use   Vaping Use: Never used  Substance and Sexual Activity   Alcohol use: Not Currently    Alcohol/week: 7.0  standard drinks of alcohol    Types: 7 Shots of liquor per week    Comment: quit 12/2021   Drug use: Never   Sexual activity: Yes    Birth control/protection: None  Other Topics Concern   Not on file  Social History Narrative   Right handed   Diet coke- 2 per day   Social Determinants of Health   Financial Resource Strain: Not on file  Food Insecurity: Not on file  Transportation Needs: Not on file  Physical Activity: Not on file  Stress: Not on file  Social Connections: Not on file  Intimate Partner Violence: Not on file  Objective:  Physical Exam: BP 118/76 (BP Location: Left Arm, Patient Position: Sitting, Cuff Size: Normal)   Pulse (!) 105   Temp (!) 96.6 F (35.9 C) (Temporal)   Ht 5' 7"  (1.702 m)   Wt 153 lb 9.6 oz (69.7 kg)   SpO2 97%   BMI 24.06 kg/m    General: No acute distress. Awake and conversant.  Eyes: Normal conjunctiva, anicteric. Round symmetric pupils.  ENT: Hearing grossly intact. No nasal discharge.  No oropharyngeal lesions, MMM Neck: Neck is mildly tender on the right, limited adenopathy. No masses or thyromegaly.  Respiratory: Respirations are non-labored. No auditory wheezing.  Skin: Warm. No rashes or ulcers.  Psych: Alert and oriented. Cooperative, Appropriate mood and affect, Normal judgment.  CV: No cyanosis or JVD GU: Right CVA tenderness ABD: Nontender, nondistended MSK: Normal ambulation. No clubbing  Neuro: Sensation and CN II-XII grossly normal.        Alesia Banda, MD, MS

## 2022-04-07 NOTE — Assessment & Plan Note (Signed)
UA positive for leuks and nitrate CVA tenderness on right Ceftriaxone 1 mg x 1 IM Keflex 500 mg twice daily for 10 days Urine culture sent Return precautions discussed

## 2022-04-09 LAB — URINE CULTURE
MICRO NUMBER:: 13697320
SPECIMEN QUALITY:: ADEQUATE

## 2022-04-12 ENCOUNTER — Telehealth: Payer: Self-pay | Admitting: Family Medicine

## 2022-04-12 ENCOUNTER — Encounter: Payer: Self-pay | Admitting: Family Medicine

## 2022-04-12 DIAGNOSIS — N390 Urinary tract infection, site not specified: Secondary | ICD-10-CM

## 2022-04-12 MED ORDER — CIPROFLOXACIN HCL 500 MG PO TABS: 500.0000 mg | ORAL_TABLET | Freq: Two times a day (BID) | ORAL | 0 refills | Status: DC

## 2022-04-12 MED ORDER — CIPROFLOXACIN HCL 500 MG PO TABS: 500.0000 mg | ORAL_TABLET | Freq: Two times a day (BID) | ORAL | 0 refills | Status: AC

## 2022-04-12 NOTE — Telephone Encounter (Signed)
Advised patient of annotation below and she verbalized understanding.

## 2022-04-12 NOTE — Telephone Encounter (Signed)
Caller Name: Indya Oliveria Call back phone #: [phone]  MEDICATION(S): ciprofloxacin  Days of Med Remaining: 0  Has the patient contacted their pharmacy (YES/NO)? Yes, they have received no order for this medication What did pharmacy advise? Please resend this prescription.   Preferred Pharmacy: [facility], [place], [place] [address] [phone]  ~~~Please advise patient/caregiver to allow 2-3 business days to process RX refills.

## 2022-04-12 NOTE — Telephone Encounter (Signed)
Patient would like ciprofloxacin sent to [place], [place].

## 2022-04-22 ENCOUNTER — Ambulatory Visit: Payer: 59 | Admitting: Psychiatry

## 2022-05-10 ENCOUNTER — Other Ambulatory Visit (HOSPITAL_COMMUNITY): Payer: Self-pay | Admitting: Physician Assistant

## 2022-05-10 DIAGNOSIS — G629 Polyneuropathy, unspecified: Secondary | ICD-10-CM

## 2022-06-12 ENCOUNTER — Emergency Department (HOSPITAL_COMMUNITY): Payer: Commercial Managed Care - HMO

## 2022-06-12 ENCOUNTER — Observation Stay (HOSPITAL_COMMUNITY)
Admission: EM | Admit: 2022-06-12 | Discharge: 2022-06-13 | Disposition: A | Discharge status: Home / Self Care | Payer: Commercial Managed Care - HMO | Attending: Internal Medicine | Admitting: Internal Medicine

## 2022-06-12 ENCOUNTER — Other Ambulatory Visit: Payer: Self-pay

## 2022-06-12 DIAGNOSIS — N179 Acute kidney failure, unspecified: Secondary | ICD-10-CM | POA: Diagnosis not present

## 2022-06-12 DIAGNOSIS — E872 Acidosis, unspecified: Secondary | ICD-10-CM

## 2022-06-12 DIAGNOSIS — Z79899 Other long term (current) drug therapy: Secondary | ICD-10-CM | POA: Insufficient documentation

## 2022-06-12 DIAGNOSIS — Z20822 Contact with and (suspected) exposure to covid-19: Secondary | ICD-10-CM | POA: Diagnosis not present

## 2022-06-12 DIAGNOSIS — R112 Nausea with vomiting, unspecified: Secondary | ICD-10-CM | POA: Insufficient documentation

## 2022-06-12 DIAGNOSIS — G621 Alcoholic polyneuropathy: Secondary | ICD-10-CM | POA: Diagnosis present

## 2022-06-12 DIAGNOSIS — E876 Hypokalemia: Secondary | ICD-10-CM

## 2022-06-12 DIAGNOSIS — J45909 Unspecified asthma, uncomplicated: Secondary | ICD-10-CM | POA: Diagnosis present

## 2022-06-12 DIAGNOSIS — F418 Other specified anxiety disorders: Secondary | ICD-10-CM | POA: Diagnosis present

## 2022-06-12 DIAGNOSIS — E860 Dehydration: Secondary | ICD-10-CM | POA: Diagnosis not present

## 2022-06-12 DIAGNOSIS — E871 Hypo-osmolality and hyponatremia: Secondary | ICD-10-CM | POA: Diagnosis present

## 2022-06-12 DIAGNOSIS — F1021 Alcohol dependence, in remission: Secondary | ICD-10-CM

## 2022-06-12 DIAGNOSIS — K529 Noninfective gastroenteritis and colitis, unspecified: Principal | ICD-10-CM | POA: Diagnosis present

## 2022-06-12 DIAGNOSIS — R0789 Other chest pain: Secondary | ICD-10-CM | POA: Diagnosis present

## 2022-06-12 DIAGNOSIS — Z91040 Latex allergy status: Secondary | ICD-10-CM | POA: Insufficient documentation

## 2022-06-12 DIAGNOSIS — K7460 Unspecified cirrhosis of liver: Secondary | ICD-10-CM | POA: Diagnosis present

## 2022-06-12 LAB — CBC WITH DIFFERENTIAL/PLATELET
Abs Immature Granulocytes: 0.12 10*3/uL — ABNORMAL HIGH (ref 0.00–0.07)
Basophils Absolute: 0 10*3/uL (ref 0.0–0.1)
Basophils Relative: 0 %
Eosinophils Absolute: 0 10*3/uL (ref 0.0–0.5)
Eosinophils Relative: 0 %
HCT: 38.9 % (ref 36.0–46.0)
Hemoglobin: 13.2 g/dL (ref 12.0–15.0)
Immature Granulocytes: 1 %
Lymphocytes Relative: 13 %
Lymphs Abs: 2.1 10*3/uL (ref 0.7–4.0)
MCH: 37 pg — ABNORMAL HIGH (ref 26.0–34.0)
MCHC: 33.9 g/dL (ref 30.0–36.0)
MCV: 109 fL — ABNORMAL HIGH (ref 80.0–100.0)
Monocytes Absolute: 1.3 10*3/uL — ABNORMAL HIGH (ref 0.1–1.0)
Monocytes Relative: 8 %
Neutro Abs: 12.3 10*3/uL — ABNORMAL HIGH (ref 1.7–7.7)
Neutrophils Relative %: 78 %
Platelets: 170 10*3/uL (ref 150–400)
RBC: 3.57 MIL/uL — ABNORMAL LOW (ref 3.87–5.11)
RDW: 13.2 % (ref 11.5–15.5)
WBC: 15.8 10*3/uL — ABNORMAL HIGH (ref 4.0–10.5)
nRBC: 0.1 % (ref 0.0–0.2)

## 2022-06-12 LAB — I-STAT BETA HCG BLOOD, ED (MC, WL, AP ONLY): I-stat hCG, quantitative: <5 m[IU]/mL (ref <5)

## 2022-06-12 LAB — I-STAT CHEM 8, ED
BUN: 18 mg/dL (ref 6–20)
Calcium, Ion: 0.98 mmol/L — ABNORMAL LOW (ref 1.15–1.40)
Chloride: 92 mmol/L — ABNORMAL LOW (ref 98–111)
Creatinine, Ser: 1.8 mg/dL — ABNORMAL HIGH (ref 0.44–1.00)
Glucose, Bld: 110 mg/dL — ABNORMAL HIGH (ref 70–99)
HCT: 43 % (ref 36.0–46.0)
Hemoglobin: 14.6 g/dL (ref 12.0–15.0)
Potassium: 4.5 mmol/L (ref 3.5–5.1)
Sodium: 121 mmol/L — ABNORMAL LOW (ref 135–145)
TCO2: 11 mmol/L — ABNORMAL LOW (ref 22–32)

## 2022-06-12 LAB — POC OCCULT BLOOD, ED: Fecal Occult Bld: NEGATIVE

## 2022-06-12 LAB — LIPASE, BLOOD: Lipase: 55 U/L — ABNORMAL HIGH (ref 11–51)

## 2022-06-12 LAB — COMPREHENSIVE METABOLIC PANEL
ALT: 78 U/L — ABNORMAL HIGH (ref 0–44)
AST: 167 U/L — ABNORMAL HIGH (ref 15–41)
Albumin: 4.1 g/dL (ref 3.5–5.0)
Alkaline Phosphatase: 300 U/L — ABNORMAL HIGH (ref 38–126)
Anion gap: 30 — ABNORMAL HIGH (ref 5–15)
BUN: 15 mg/dL (ref 6–20)
CO2: 10 mmol/L — ABNORMAL LOW (ref 22–32)
Calcium: 9.1 mg/dL (ref 8.9–10.3)
Chloride: 85 mmol/L — ABNORMAL LOW (ref 98–111)
Creatinine, Ser: 1.95 mg/dL — ABNORMAL HIGH (ref 0.44–1.00)
GFR, Estimated: 33 mL/min — ABNORMAL LOW (ref >60)
Glucose, Bld: 115 mg/dL — ABNORMAL HIGH (ref 70–99)
Potassium: 4.6 mmol/L (ref 3.5–5.1)
Sodium: 125 mmol/L — ABNORMAL LOW (ref 135–145)
Total Bilirubin: 3.8 mg/dL — ABNORMAL HIGH (ref 0.3–1.2)
Total Protein: 7.5 g/dL (ref 6.5–8.1)

## 2022-06-12 LAB — PROTIME-INR
INR: 1.3 — ABNORMAL HIGH (ref 0.8–1.2)
Prothrombin Time: 15.6 seconds — ABNORMAL HIGH (ref 11.4–15.2)

## 2022-06-12 LAB — LACTIC ACID, PLASMA
Lactic Acid, Venous: 6.4 mmol/L (ref 0.5–1.9)
Lactic Acid, Venous: 4 mmol/L (ref 0.5–1.9)
Lactic Acid, Venous: 1.7 mmol/L (ref 0.5–1.9)

## 2022-06-12 LAB — RESP PANEL BY RT-PCR (FLU A&B, COVID) ARPGX2
Influenza A by PCR: NEGATIVE
Influenza B by PCR: NEGATIVE
SARS Coronavirus 2 by RT PCR: NEGATIVE

## 2022-06-12 LAB — TYPE AND SCREEN
ABO/RH(D): O POS
Antibody Screen: NEGATIVE

## 2022-06-12 LAB — APTT: aPTT: 32 seconds (ref 24–36)

## 2022-06-12 LAB — SODIUM: Sodium: 129 mmol/L — ABNORMAL LOW (ref 135–145)

## 2022-06-12 LAB — OSMOLALITY: Osmolality: 283 mOsm/kg (ref 275–295)

## 2022-06-12 MED ORDER — FOLIC ACID 1 MG PO TABS
1.0000 mg | ORAL_TABLET | Freq: Every day | ORAL | Status: DC
  Administered 2022-06-13: 1 mg via ORAL
  Filled 2022-06-12: qty 1

## 2022-06-12 MED ORDER — SODIUM CHLORIDE 0.9 % IV SOLN: INTRAVENOUS | Status: DC

## 2022-06-12 MED ORDER — HYDROXYZINE HCL 25 MG PO TABS
25.0000 mg | ORAL_TABLET | Freq: Two times a day (BID) | ORAL | Status: DC
  Administered 2022-06-12 – 2022-06-13 (×2): 25 mg via ORAL
  Filled 2022-06-12 (×3): qty 1

## 2022-06-12 MED ORDER — NALTREXONE HCL 50 MG PO TABS
50.0000 mg | ORAL_TABLET | Freq: Every day | ORAL | Status: DC
  Administered 2022-06-13: 50 mg via ORAL
  Filled 2022-06-12: qty 1

## 2022-06-12 MED ORDER — FENTANYL CITRATE PF 50 MCG/ML IJ SOSY
50.0000 ug | PREFILLED_SYRINGE | Freq: Once | INTRAMUSCULAR | Status: AC
  Administered 2022-06-12: 50 ug via INTRAVENOUS
  Filled 2022-06-12: qty 1

## 2022-06-12 MED ORDER — CARIPRAZINE HCL 1.5 MG PO CAPS
1.5000 mg | ORAL_CAPSULE | Freq: Every day | ORAL | Status: DC
  Administered 2022-06-13: 1.5 mg via ORAL
  Filled 2022-06-12: qty 1

## 2022-06-12 MED ORDER — ALUM & MAG HYDROXIDE-SIMETH 200-200-20 MG/5ML PO SUSP
30.0000 mL | Freq: Once | ORAL | Status: AC
  Administered 2022-06-12: 30 mL via ORAL
  Filled 2022-06-12: qty 30

## 2022-06-12 MED ORDER — ONDANSETRON HCL 4 MG/2ML IJ SOLN: 4.0000 mg | Freq: Four times a day (QID) | INTRAMUSCULAR | Status: DC | PRN

## 2022-06-12 MED ORDER — LIDOCAINE VISCOUS HCL 2 % MT SOLN
15.0000 mL | Freq: Once | OROMUCOSAL | Status: AC
  Administered 2022-06-12: 15 mL via ORAL
  Filled 2022-06-12: qty 15

## 2022-06-12 MED ORDER — IOHEXOL 350 MG/ML SOLN
60.0000 mL | Freq: Once | INTRAVENOUS | Status: AC | PRN
  Administered 2022-06-12: 60 mL via INTRAVENOUS

## 2022-06-12 MED ORDER — SODIUM CHLORIDE 0.9 % IV BOLUS
1000.0000 mL | Freq: Once | INTRAVENOUS | Status: AC
  Administered 2022-06-12: 1000 mL via INTRAVENOUS

## 2022-06-12 MED ORDER — ENOXAPARIN SODIUM 40 MG/0.4ML IJ SOSY: 40.0000 mg | PREFILLED_SYRINGE | INTRAMUSCULAR | Status: DC

## 2022-06-12 MED ORDER — ONDANSETRON HCL 4 MG/2ML IJ SOLN
4.0000 mg | Freq: Once | INTRAMUSCULAR | Status: AC
  Administered 2022-06-12: 4 mg via INTRAVENOUS
  Filled 2022-06-12: qty 2

## 2022-06-12 MED ORDER — DIPHENHYDRAMINE HCL 50 MG/ML IJ SOLN
25.0000 mg | Freq: Once | INTRAMUSCULAR | Status: AC
  Administered 2022-06-12: 25 mg via INTRAVENOUS
  Filled 2022-06-12: qty 1

## 2022-06-12 MED ORDER — TRAZODONE HCL 50 MG PO TABS
50.0000 mg | ORAL_TABLET | Freq: Every evening | ORAL | Status: DC | PRN
  Administered 2022-06-12: 50 mg via ORAL
  Filled 2022-06-12: qty 1

## 2022-06-12 MED ORDER — LACTATED RINGERS IV BOLUS
1000.0000 mL | Freq: Once | INTRAVENOUS | Status: AC
  Administered 2022-06-12: 1000 mL via INTRAVENOUS

## 2022-06-12 MED ORDER — PREGABALIN 25 MG PO CAPS
25.0000 mg | ORAL_CAPSULE | Freq: Two times a day (BID) | ORAL | Status: DC
  Administered 2022-06-12 – 2022-06-13 (×2): 25 mg via ORAL
  Filled 2022-06-12 (×3): qty 1

## 2022-06-12 MED ORDER — ALBUTEROL SULFATE (2.5 MG/3ML) 0.083% IN NEBU: 2.5000 mg | INHALATION_SOLUTION | Freq: Four times a day (QID) | RESPIRATORY_TRACT | Status: DC | PRN

## 2022-06-12 MED ORDER — PANTOPRAZOLE SODIUM 40 MG IV SOLR
40.0000 mg | Freq: Once | INTRAVENOUS | Status: AC
  Administered 2022-06-12: 40 mg via INTRAVENOUS
  Filled 2022-06-12: qty 10

## 2022-06-12 NOTE — Assessment & Plan Note (Signed)
Continue Lyrica.

## 2022-06-12 NOTE — ED Provider Notes (Signed)
[facility] EMERGENCY DEPARTMENT Provider Note   CSN: [id number] Arrival date & time: 06/12/22  1215     History  Chief Complaint  Patient presents with   Chest Pain   Shortness of Breath   Emesis    Jeanette Elliott is a 41 y.o. female.  All   Chest Pain Associated symptoms: shortness of breath and vomiting   Shortness of Breath Associated symptoms: chest pain and vomiting   Emesis Patient presents with nausea and vomiting.  Chest pain in the anterior chest.  Has had pain in the chest and upper abdomen for around 2 weeks now.  Has had vomiting at occasion.  Reportedly had some black stool today.  Has had a bit of diarrhea.  Has had previous liver issues due to her alcohol use.  No longer drinking and states she is off the transplant list.  Has had previous ulcers.  Did have a little bit of red in the emesis and thought it may have been from the strawberry shake.     Home Medications Prior to Admission medications   Medication Sig Start Date End Date Taking? Authorizing Provider  albuterol (VENTOLIN HFA) 108 (90 Base) MCG/ACT inhaler Inhale 2 puffs into the lungs every 6 (six) hours as needed (asthma attacks). 01/22/21   Cirigliano, Garvin Fila, DO  Beta Carotene (VITAMIN A) 25000 UNIT capsule Take 25,000 Units by mouth daily.    [provider]  cariprazine (VRAYLAR) 1.5 MG capsule Take 1 capsule (1.5 mg total) by mouth daily. 03/30/22   [name], NP  cholecalciferol (VITAMIN D3) 25 MCG (1000 UNIT) tablet Take 1,000 Units by mouth daily.    [provider]  cyanocobalamin (,VITAMIN B-12,) 1000 MCG/ML injection SMARTSIG:1 Milliliter(s) IM Every 3 Days 12/25/21   [provider]  Dextromethorphan-buPROPion ER (AUVELITY) 45-105 MG TBCR Take 1 tablet by mouth 2 (two) times daily. 03/30/22   [name], NP  EPINEPHrine 0.3 mg/0.3 mL IJ SOAJ injection Inject 0.3 mg into the muscle once as needed for anaphylaxis. 01/22/21   Cirigliano, Mary  K, DO  escitalopram (LEXAPRO) 20 MG tablet Take 1 tablet (20 mg total) by mouth daily. 03/30/22   [name], NP  folic acid (FOLVITE) 1 MG tablet TAKE 1 TABLET(1 MG) BY MOUTH DAILY 04/09/21   Cirigliano, Garvin Fila, DO  furosemide (LASIX) 20 MG tablet Take 1 tablet (20 mg total) by mouth daily. 08/19/21   Haydee Salter, MD  hydrOXYzine (ATARAX) 25 MG tablet Take 1 tablet (25 mg total) by mouth 2 (two) times daily. 01/27/22   [name], NP  naltrexone (DEPADE) 50 MG tablet Take 50 mg by mouth daily. 02/15/22   [provider]  potassium chloride SA (KLOR-CON) 20 MEQ tablet Take 1 tablet (20 mEq total) by mouth daily. 02/28/21 04/07/22  Dagar, Meredith Staggers, MD  pregabalin (LYRICA) 25 MG capsule Take 1 capsule (25 mg total) by mouth 2 (two) times daily. 12/23/21   Haydee Salter, MD  spironolactone (ALDACTONE) 50 MG tablet TAKE 1 TABLET(50 MG) BY MOUTH EVERY MORNING 11/26/21   Haydee Salter, MD  traZODone (DESYREL) 50 MG tablet TAKE 1 TABLET(50 MG) BY MOUTH AT BEDTIME Patient taking differently: at bedtime as needed. 11/30/21   [name], NP  traZODone (DESYREL) 50 MG tablet Take 1 tablet (50 mg total) by mouth at bedtime. 03/30/22   [name], NP  ZOLMitriptan (ZOMIG) 2.5 MG tablet Take 1 tablet (2.5 mg total)  by mouth once for 1 dose. May repeat in 2 hours if headache persists or recurs. 05/05/21 04/07/22  Haydee Salter, MD      Allergies    Latex and Keflex [cephalexin]    Review of Systems   Review of Systems  Respiratory:  Positive for shortness of breath.   Cardiovascular:  Positive for chest pain.  Gastrointestinal:  Positive for vomiting.    Physical Exam Updated Vital Signs BP 101/77   Pulse (!) 101   Temp 98.2 F (36.8 C) (Oral)   Resp (!) 22   Ht 5' 7"  (1.702 m)   Wt 65.8 kg   SpO2 100%   BMI 22.71 kg/m  Physical Exam Vitals and nursing note reviewed.  Cardiovascular:     Rate and Rhythm: Regular rhythm. Tachycardia present.  Pulmonary:     Breath sounds:  No wheezing or rhonchi.  Chest:     Chest wall: No tenderness.  Abdominal:     Comments: Epigastric and left upper quadrant tenderness.  No rebound or guarding.  No hernia palpated.  Musculoskeletal:     Cervical back: Neck supple.     Right lower leg: No edema.     Left lower leg: No edema.  Skin:    Capillary Refill: Capillary refill takes less than 2 seconds.  Neurological:     Mental Status: She is alert.     ED Results / Procedures / Treatments   Labs (all labs ordered are listed, but only abnormal results are displayed) Labs Reviewed  LACTIC ACID, PLASMA - Abnormal; Notable for the following components:      Result Value   Lactic Acid, Venous 6.4 (*)    All other components within normal limits  COMPREHENSIVE METABOLIC PANEL - Abnormal; Notable for the following components:   Sodium 125 (*)    Chloride 85 (*)    CO2 10 (*)    Glucose, Bld 115 (*)    Creatinine, Ser 1.95 (*)    AST 167 (*)    ALT 78 (*)    Alkaline Phosphatase 300 (*)    Total Bilirubin 3.8 (*)    GFR, Estimated 33 (*)    Anion gap 30 (*)    All other components within normal limits  CBC WITH DIFFERENTIAL/PLATELET - Abnormal; Notable for the following components:   WBC 15.8 (*)    RBC 3.57 (*)    MCV 109.0 (*)    MCH 37.0 (*)    Neutro Abs 12.3 (*)    Monocytes Absolute 1.3 (*)    Abs Immature Granulocytes 0.12 (*)    All other components within normal limits  PROTIME-INR - Abnormal; Notable for the following components:   Prothrombin Time 15.6 (*)    INR 1.3 (*)    All other components within normal limits  LIPASE, BLOOD - Abnormal; Notable for the following components:   Lipase 55 (*)    All other components within normal limits  I-STAT CHEM 8, ED - Abnormal; Notable for the following components:   Sodium 121 (*)    Chloride 92 (*)    Creatinine, Ser 1.80 (*)    Glucose, Bld 110 (*)    Calcium, Ion 0.98 (*)    TCO2 11 (*)    All other components within normal limits  RESP PANEL BY  RT-PCR (FLU A&B, COVID) ARPGX2  APTT  LACTIC ACID, PLASMA  URINALYSIS, ROUTINE W REFLEX MICROSCOPIC  I-STAT BETA HCG BLOOD, ED ([facility], [facility], [facility] ONLY)  POC  OCCULT BLOOD, ED  TYPE AND SCREEN    EKG None  Radiology CT ABDOMEN PELVIS W CONTRAST  Result Date: 06/12/2022 CLINICAL DATA:  LUQ abdominal pain EXAM: CT ABDOMEN AND PELVIS WITH CONTRAST TECHNIQUE: Multidetector CT imaging of the abdomen and pelvis was performed using the standard protocol following bolus administration of intravenous contrast. RADIATION DOSE REDUCTION: This exam was performed according to the departmental dose-optimization program which includes automated exposure control, adjustment of the mA and/or kV according to patient size and/or use of iterative reconstruction technique. CONTRAST:  53m OMNIPAQUE IOHEXOL 350 MG/ML SOLN COMPARISON:  February 20, 2021 FINDINGS: Lower chest: No acute abnormality. Hepatobiliary: Hepatic steatosis and hepatomegaly. Cholelithiasis. Mildly nodular contours of portions of the liver. Recanalized paraumbilical vein. Pancreas: Unremarkable. No pancreatic ductal dilatation or surrounding inflammatory changes. Spleen: Normal in size without focal abnormality. Adrenals/Urinary Tract: Adrenal glands are unremarkable. Kidneys enhance symmetrically. Cortical thinning of the inferior pole of the RIGHT kidney. New calyceal calcifications of the inferior pole of the RIGHT kidney. No hydronephrosis or obstructing nephrolithiasis. Bladder is unremarkable. Stomach/Bowel: No evidence of bowel obstruction. Appendix is normal. There is focal bowel wall thickening and mucosal hyperenhancement of a portion of the ileum in the RIGHT lower quadrant. Circumferential wall thickening of the distal esophagus, likely esophagitis. Stomach is decompressed. Vascular/Lymphatic: Age advanced atherosclerotic calcifications of the superficial femoral arteries. No enlarged abdominal or pelvic lymph nodes. Reproductive: Uterus and bilateral  adnexa are unremarkable. Other: Mildly misty mesentery. This is similar in comparison to priors. No free air. Musculoskeletal: Remote rib fractures. IMPRESSION: 1. There are inflammatory changes of the distal ileum. This could reflect a nonspecific ileitis (infectious or inflammatory). 2. Cirrhosis with sequela of portal venous hypertension. 3. Esophageal wall thickening likely reflecting underlying esophagitis. 4. New calyceal calcification of the inferior pole of the RIGHT kidney with differential considerations including nonobstructing nephrolithiasis or sequela of renal papillary necrosis. 5. Age advanced atherosclerotic calcifications of the superficial femoral arteries. Electronically Signed   By: SValentino SaxonM.D.   On: 06/12/2022 15:34   DG Chest Portable 1 View  Result Date: 06/12/2022 CLINICAL DATA:  chest pain EXAM: PORTABLE CHEST 1 VIEW COMPARISON:  February 24, 2021 FINDINGS: The cardiomediastinal silhouette is normal in contour. No pleural effusion. No pneumothorax. No acute pleuroparenchymal abnormality. Visualized abdomen is unremarkable. No acute osseous abnormality noted. IMPRESSION: No acute cardiopulmonary abnormality. Electronically Signed   By: SValentino SaxonM.D.   On: 06/12/2022 14:27    Procedures Procedures    Medications Ordered in ED Medications  fentaNYL (SUBLIMAZE) injection 50 mcg (has no administration in time range)  lactated ringers bolus 1,000 mL (0 mLs Intravenous Stopped 06/12/22 1337)  pantoprazole (PROTONIX) injection 40 mg (40 mg Intravenous Given 06/12/22 1336)  sodium chloride 0.9 % bolus 1,000 mL (1,000 mLs Intravenous New Bag/Given 06/12/22 1435)  ondansetron (ZOFRAN) injection 4 mg (4 mg Intravenous Given 06/12/22 1431)  lactated ringers bolus 1,000 mL (1,000 mLs Intravenous New Bag/Given 06/12/22 1539)  diphenhydrAMINE (BENADRYL) injection 25 mg (25 mg Intravenous Given 06/12/22 1515)  iohexol (OMNIPAQUE) 350 MG/ML injection 60 mL (60 mLs Intravenous  Contrast Given 06/12/22 1513)    ED Course/ Medical Decision Making/ A&P Clinical Course as of 06/12/22 1549  Sat Jun 12, 2022  1442 Sodium(!): 121 [AA]  1442 BUN: 15 [AA]  1442 Glucose(!): 115 [AA]  1443 Hemoglobin: 13.2 [AA]    Clinical Course User Index [AA] AEvlyn Courier PA-C  Medical Decision Making Amount and/or Complexity of Data Reviewed Labs: ordered. Radiology: ordered.  Risk Prescription drug management.   Patient with chest pain and abdominal pain.  Nausea and vomiting.  With black stool.  Cardiac cause felt less likely.  EKG reassuring but does show some tachycardia.  Will get chest x-ray.  However has had liver issues and ulcer in the past and I think this is more likely the cause.  Some tenderness.  Will get lipase basic blood work and check Hemoccult.  Lipase mildly elevated.  Hemoccult negative.  Does have new kidney injury and acidosis with a bicarb of 10.  I think this is likely secondary to dehydration although does have some underlying liver injury.  LFTs mildly elevated.  Will get CT scan to evaluate.  Heart rate has been coming down and blood pressure improving with fluids.  Do not think this is a severe sepsis at this time.  CT scan showed potential ileitis.  Will check repeat lactate to see if it is trending down.  Care turned over to Dr.Kommer          Final Clinical Impression(s) / ED Diagnoses Final diagnoses:  AKI (acute kidney injury) ([place])  Lactic acidosis  Dehydration  Ileitis    Rx / DC Orders ED Discharge Orders     None         Davonna Belling, MD 06/12/22 1549

## 2022-06-12 NOTE — ED Provider Triage Note (Signed)
Emergency Medicine Provider Triage Evaluation Note  Jeanette Elliott , a 41 y.o. female  was evaluated in triage.  Pt complains of chest pain, tarry stools, emesis.  States she ate strawberries and had some red in her emesis yesterday today its been brown.  Denies history of GI bleed.  Not on anticoagulation.    Review of Systems  Positive: As above Negative: As above  Physical Exam  BP (!) 86/65   Pulse (!) 120   Temp 98.1 F (36.7 C) (Oral)   Resp 20   Ht 5' 7"  (1.702 m)   Wt 65.8 kg   SpO2 100%   BMI 22.71 kg/m  Gen:   Awake Resp:  Normal effort  MSK:   Moves extremities without difficulty  Other:    Medical Decision Making  Medically screening exam initiated at 1:18 PM.  Appropriate orders placed.  Jeanette Elliott was informed that the remainder of the evaluation will be completed by another provider, this initial triage assessment does not replace that evaluation, and the importance of remaining in the ED until their evaluation is complete.  Hypotensive in triage with systolic of 32T.  Tachycardic at 120.  Will order fluid bolus, Protonix.  Patient will be next for a room given her acuity.   Evlyn Courier, PA-C 06/12/22 1321

## 2022-06-12 NOTE — H&P (Signed)
History and Physical    Raseel Maden MRN:[id number] DOB: 06/19/1981 DOA: 06/12/2022  PCP: Rudd, Stephen M, MD  Patient coming from: Home  I have personally briefly reviewed patient's old medical records in [facility]  Chief Complaint: Nausea, vomiting  HPI: Jeanette Elliott is a 41 y.o. female with medical history significant for hepatic cirrhosis, history of alcohol dependence now in remission, asthma, peripheral neuropathy, depression/anxiety who presented to the ED for evaluation of nausea and vomiting.  Patient reports new onset of nausea and vomiting beginning on 9/29.  She has not been able to maintain much adequate oral intake but is trying to keep up with oral fluid hydration.  Today she developed diarrhea with 2 episodes of dark-colored stool.  She has had some pain below her left breast which feels like it is going straight through to her back.  She has had chills without diaphoresis.  She reports decreased urine output due to dehydration, has not had any dysuria.  She denies any sick contacts or obvious dietary causes of her symptoms.  About 2 months ago she was treated for UTI.  Initially on oral Keflex but developed side effect of vomiting, diarrhea, pruritus.  Antibiotics were switched to ciprofloxacin which she completed.  ED Course  Labs/Imaging on admission: I have personally reviewed following labs and imaging studies.  Initial vitals showed BP 86/65, pulse 120, RR 20, temp 98.1 F, SPO2 100% on room air.  Labs show WBC 15.8, hemoglobin 13.2, platelets 170,000, sodium 125, potassium 4.6, bicarb 10, BUN 15, creatinine 1.95, serum glucose 115, AST 167, ALT 78, alk phos 300, total bilirubin 3.8, INR 1.3, lactic acid 6.4 > 4.0, lipase 55.  I-STAT beta-hCG <5.0.  SARS-CoV-2 and influenza PCR negative.  FOBT negative.  Portable chest x-ray negative for focal consolidation, edema, effusion.  CT abdomen/pelvis with contrast showed inflammatory changes of the distal ileum  which may reflect a nonspecific ileitis.  Cirrhosis with sequela of portal venous hypertension, esophageal wall thickening reflecting esophagitis, and new calyceal calcification of the inferior pole of the right kidney noted.  Patient was given 2 L of LR, 1 L NS, IV Protonix 40 mg, IV Zofran 4 mg, fentanyl 50 mcg.  The hospitalist service was consulted to admit for further evaluation and management.  Review of Systems: All systems reviewed and are negative except as documented in history of present illness above.   Past Medical History:  Diagnosis Date   Asthma    Elevated liver enzymes    Jaundice    Sjogren's syndrome (HCC)     Past Surgical History:  Procedure Laterality Date   IR PARACENTESIS  02/03/2021   IR THORACENTESIS ASP PLEURAL SPACE W/IMG GUIDE  01/16/2021   ORIF FINGER / THUMB FRACTURE Left    Thumb   SUBMANDIBULAR GLAND EXCISION     TONSILLECTOMY AND ADENOIDECTOMY      Social History:  reports that she has never smoked. She has never used smokeless tobacco. She reports that she does not currently use alcohol after a past usage of about 7.0 standard drinks of alcohol per week. She reports that she does not use drugs.  Allergies  Allergen Reactions   Latex Itching and Rash   Iodine Itching   Keflex [Cephalexin] Diarrhea and Nausea And Vomiting    Family History  Problem Relation Age of Onset   Arthritis Mother    Heart disease Father    Cancer Father        Prostate   Arthritis   History and Physical    Raseel Maden MRN:[id number] DOB: 06/19/1981 DOA: 06/12/2022  PCP: Rudd, Stephen M, MD  Patient coming from: Home  I have personally briefly reviewed patient's old medical records in [facility]  Chief Complaint: Nausea, vomiting  HPI: Jeanette Elliott is a 41 y.o. female with medical history significant for hepatic cirrhosis, history of alcohol dependence now in remission, asthma, peripheral neuropathy, depression/anxiety who presented to the ED for evaluation of nausea and vomiting.  Patient reports new onset of nausea and vomiting beginning on 9/29.  She has not been able to maintain much adequate oral intake but is trying to keep up with oral fluid hydration.  Today she developed diarrhea with 2 episodes of dark-colored stool.  She has had some pain below her left breast which feels like it is going straight through to her back.  She has had chills without diaphoresis.  She reports decreased urine output due to dehydration, has not had any dysuria.  She denies any sick contacts or obvious dietary causes of her symptoms.  About 2 months ago she was treated for UTI.  Initially on oral Keflex but developed side effect of vomiting, diarrhea, pruritus.  Antibiotics were switched to ciprofloxacin which she completed.  ED Course  Labs/Imaging on admission: I have personally reviewed following labs and imaging studies.  Initial vitals showed BP 86/65, pulse 120, RR 20, temp 98.1 F, SPO2 100% on room air.  Labs show WBC 15.8, hemoglobin 13.2, platelets 170,000, sodium 125, potassium 4.6, bicarb 10, BUN 15, creatinine 1.95, serum glucose 115, AST 167, ALT 78, alk phos 300, total bilirubin 3.8, INR 1.3, lactic acid 6.4 > 4.0, lipase 55.  I-STAT beta-hCG <5.0.  SARS-CoV-2 and influenza PCR negative.  FOBT negative.  Portable chest x-ray negative for focal consolidation, edema, effusion.  CT abdomen/pelvis with contrast showed inflammatory changes of the distal ileum  which may reflect a nonspecific ileitis.  Cirrhosis with sequela of portal venous hypertension, esophageal wall thickening reflecting esophagitis, and new calyceal calcification of the inferior pole of the right kidney noted.  Patient was given 2 L of LR, 1 L NS, IV Protonix 40 mg, IV Zofran 4 mg, fentanyl 50 mcg.  The hospitalist service was consulted to admit for further evaluation and management.  Review of Systems: All systems reviewed and are negative except as documented in history of present illness above.   Past Medical History:  Diagnosis Date   Asthma    Elevated liver enzymes    Jaundice    Sjogren's syndrome (HCC)     Past Surgical History:  Procedure Laterality Date   IR PARACENTESIS  02/03/2021   IR THORACENTESIS ASP PLEURAL SPACE W/IMG GUIDE  01/16/2021   ORIF FINGER / THUMB FRACTURE Left    Thumb   SUBMANDIBULAR GLAND EXCISION     TONSILLECTOMY AND ADENOIDECTOMY      Social History:  reports that she has never smoked. She has never used smokeless tobacco. She reports that she does not currently use alcohol after a past usage of about 7.0 standard drinks of alcohol per week. She reports that she does not use drugs.  Allergies  Allergen Reactions   Latex Itching and Rash   Iodine Itching   Keflex [Cephalexin] Diarrhea and Nausea And Vomiting    Family History  Problem Relation Age of Onset   Arthritis Mother    Heart disease Father    Cancer Father        Prostate   Arthritis   History and Physical    Raseel Maden MRN:[id number] DOB: 06/19/1981 DOA: 06/12/2022  PCP: Rudd, Stephen M, MD  Patient coming from: Home  I have personally briefly reviewed patient's old medical records in [place] [facility]  Chief Complaint: Nausea, vomiting  HPI: Jeanette Elliott is a 41 y.o. female with medical history significant for hepatic cirrhosis, history of alcohol dependence now in remission, asthma, peripheral neuropathy, depression/anxiety who presented to the ED for evaluation of nausea and vomiting.  Patient reports new onset of nausea and vomiting beginning on 9/29.  She has not been able to maintain much adequate oral intake but is trying to keep up with oral fluid hydration.  Today she developed diarrhea with 2 episodes of dark-colored stool.  She has had some pain below her left breast which feels like it is going straight through to her back.  She has had chills without diaphoresis.  She reports decreased urine output due to dehydration, has not had any dysuria.  She denies any sick contacts or obvious dietary causes of her symptoms.  About 2 months ago she was treated for UTI.  Initially on oral Keflex but developed side effect of vomiting, diarrhea, pruritus.  Antibiotics were switched to ciprofloxacin which she completed.  ED Course  Labs/Imaging on admission: I have personally reviewed following labs and imaging studies.  Initial vitals showed BP 86/65, pulse 120, RR 20, temp 98.1 F, SPO2 100% on room air.  Labs show WBC 15.8, hemoglobin 13.2, platelets 170,000, sodium 125, potassium 4.6, bicarb 10, BUN 15, creatinine 1.95, serum glucose 115, AST 167, ALT 78, alk phos 300, total bilirubin 3.8, INR 1.3, lactic acid 6.4 > 4.0, lipase 55.  I-STAT beta-hCG <5.0.  SARS-CoV-2 and influenza PCR negative.  FOBT negative.  Portable chest x-ray negative for focal consolidation, edema, effusion.  CT abdomen/pelvis with contrast showed inflammatory changes of the distal ileum  which may reflect a nonspecific ileitis.  Cirrhosis with sequela of portal venous hypertension, esophageal wall thickening reflecting esophagitis, and new calyceal calcification of the inferior pole of the right kidney noted.  Patient was given 2 L of LR, 1 L NS, IV Protonix 40 mg, IV Zofran 4 mg, fentanyl 50 mcg.  The hospitalist service was consulted to admit for further evaluation and management.  Review of Systems: All systems reviewed and are negative except as documented in history of present illness above.   Past Medical History:  Diagnosis Date   Asthma    Elevated liver enzymes    Jaundice    Sjogren's syndrome (HCC)     Past Surgical History:  Procedure Laterality Date   IR PARACENTESIS  02/03/2021   IR THORACENTESIS ASP PLEURAL SPACE W/IMG GUIDE  01/16/2021   ORIF FINGER / THUMB FRACTURE Left    Thumb   SUBMANDIBULAR GLAND EXCISION     TONSILLECTOMY AND ADENOIDECTOMY      Social History:  reports that she has never smoked. She has never used smokeless tobacco. She reports that she does not currently use alcohol after a past usage of about 7.0 standard drinks of alcohol per week. She reports that she does not use drugs.  Allergies  Allergen Reactions   Latex Itching and Rash   Iodine Itching   Keflex [Cephalexin] Diarrhea and Nausea And Vomiting    Family History  Problem Relation Age of Onset   Arthritis Mother    Heart disease Father    Cancer Father        Prostate   Arthritis

## 2022-06-12 NOTE — Assessment & Plan Note (Signed)
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  Patient quit alcohol May 2022.  LFTs chronically elevated and similar to prior except T. bili slightly higher.  No sign of ascites or hepatic encephalopathy on admission. -Holding spironolactone and Lasix as above, resume when able

## 2022-06-12 NOTE — Assessment & Plan Note (Signed)
Stable without wheezing on admission.  Continue albuterol as needed.

## 2022-06-12 NOTE — Assessment & Plan Note (Signed)
Continue Vraylar, Atarax.  Hold Lexapro and Auvelity for now in setting of hyponatremia.

## 2022-06-12 NOTE — Assessment & Plan Note (Signed)
Maintaining sobriety.  Continue naltrexone.

## 2022-06-12 NOTE — Assessment & Plan Note (Signed)
Due to hypovolemia from GI losses. -Continue IV fluid hydration overnight -Hold Lasix and spironolactone

## 2022-06-12 NOTE — Assessment & Plan Note (Addendum)
Sodium 125 on admission in setting of hypovolemia from GI losses. -Continue IV NS@100  mL/hour -Repeat sodium level tonight and CMP in a.m.

## 2022-06-12 NOTE — ED Triage Notes (Signed)
Pt to ED from home with c/o left sided CP x 2 weeks. Pt states last night she began having shortness of breath and vomiting. Pt states she had black diarrhea this AM. Alert and oriented x 4 in triage.

## 2022-06-12 NOTE — Hospital Course (Signed)
Jeanette Elliott is a 41 y.o. female with medical history significant for hepatic cirrhosis, history of alcohol dependence now in remission, asthma, depression/anxiety who is admitted with nausea, vomiting, diarrhea due to ileitis and associated with hyponatremia and AKI.

## 2022-06-12 NOTE — Assessment & Plan Note (Addendum)
Nausea/vomiting/diarrhea Acute GI illness likely due to ileitis as seen on CT imaging.  Reports only 2 episodes of diarrhea on day of admission.  Not having significant abdominal pain.  Lactic acid with acute on chronic elevation in setting of hypovolemia. -Continue IV fluid hydration overnight -Repeat lactic acid -Antiemetics as needed -Advance diet as tolerated

## 2022-06-13 ENCOUNTER — Encounter (HOSPITAL_COMMUNITY): Payer: Self-pay | Admitting: Internal Medicine

## 2022-06-13 DIAGNOSIS — K529 Noninfective gastroenteritis and colitis, unspecified: Secondary | ICD-10-CM | POA: Diagnosis not present

## 2022-06-13 LAB — CBC
HCT: 29.4 % — ABNORMAL LOW (ref 36.0–46.0)
Hemoglobin: 10.3 g/dL — ABNORMAL LOW (ref 12.0–15.0)
MCH: 36.1 pg — ABNORMAL HIGH (ref 26.0–34.0)
MCHC: 35 g/dL (ref 30.0–36.0)
MCV: 103.2 fL — ABNORMAL HIGH (ref 80.0–100.0)
Platelets: 112 10*3/uL — ABNORMAL LOW (ref 150–400)
RBC: 2.85 MIL/uL — ABNORMAL LOW (ref 3.87–5.11)
RDW: 13.4 % (ref 11.5–15.5)
WBC: 10.5 10*3/uL (ref 4.0–10.5)
nRBC: 0.2 % (ref 0.0–0.2)

## 2022-06-13 LAB — COMPREHENSIVE METABOLIC PANEL
ALT: 56 U/L — ABNORMAL HIGH (ref 0–44)
AST: 124 U/L — ABNORMAL HIGH (ref 15–41)
Albumin: 3.1 g/dL — ABNORMAL LOW (ref 3.5–5.0)
Alkaline Phosphatase: 198 U/L — ABNORMAL HIGH (ref 38–126)
Anion gap: 10 (ref 5–15)
BUN: 19 mg/dL (ref 6–20)
CO2: 21 mmol/L — ABNORMAL LOW (ref 22–32)
Calcium: 8.5 mg/dL — ABNORMAL LOW (ref 8.9–10.3)
Chloride: 102 mmol/L (ref 98–111)
Creatinine, Ser: 1.08 mg/dL — ABNORMAL HIGH (ref 0.44–1.00)
GFR, Estimated: >60 mL/min (ref >60)
Glucose, Bld: 116 mg/dL — ABNORMAL HIGH (ref 70–99)
Potassium: 3.6 mmol/L (ref 3.5–5.1)
Sodium: 133 mmol/L — ABNORMAL LOW (ref 135–145)
Total Bilirubin: 1.9 mg/dL — ABNORMAL HIGH (ref 0.3–1.2)
Total Protein: 5.5 g/dL — ABNORMAL LOW (ref 6.5–8.1)

## 2022-06-13 LAB — PROTIME-INR
INR: 1.3 — ABNORMAL HIGH (ref 0.8–1.2)
Prothrombin Time: 15.7 seconds — ABNORMAL HIGH (ref 11.4–15.2)

## 2022-06-13 LAB — HIV ANTIBODY (ROUTINE TESTING W REFLEX): HIV Screen 4th Generation wRfx: NONREACTIVE

## 2022-06-13 MED ORDER — DEXTROMETHORPHAN-BUPROPION ER 45-105 MG PO TBCR: 1.0000 | EXTENDED_RELEASE_TABLET | Freq: Two times a day (BID) | ORAL | Status: DC

## 2022-06-13 MED ORDER — SPIRONOLACTONE 50 MG PO TABS: 50.0000 mg | ORAL_TABLET | Freq: Every day | ORAL | Status: DC

## 2022-06-13 MED ORDER — FUROSEMIDE 20 MG PO TABS: 20.0000 mg | ORAL_TABLET | Freq: Every day | ORAL | 3 refills | Status: DC

## 2022-06-13 MED ORDER — ESCITALOPRAM OXALATE 10 MG PO TABS
20.0000 mg | ORAL_TABLET | Freq: Every day | ORAL | Status: DC
  Administered 2022-06-13: 20 mg via ORAL
  Filled 2022-06-13: qty 2

## 2022-06-13 MED ORDER — ALBUTEROL SULFATE (2.5 MG/3ML) 0.083% IN NEBU: 2.5000 mg | INHALATION_SOLUTION | Freq: Four times a day (QID) | RESPIRATORY_TRACT | Status: DC | PRN

## 2022-06-13 NOTE — Plan of Care (Signed)
Patient ID: [place] Oborn, female   DOB: 10/16/1980, 41 y.o.   MRN: [id number]  Problem: Education: Goal: Knowledge of General Education information will improve Description: Including pain rating scale, medication(s)/side effects and non-pharmacologic comfort measures Outcome: Adequate for Discharge   Problem: Health Behavior/Discharge Planning: Goal: Ability to manage health-related needs will improve Outcome: Adequate for Discharge   Problem: Clinical Measurements: Goal: Ability to maintain clinical measurements within normal limits will improve Outcome: Adequate for Discharge Goal: Will remain free from infection Outcome: Adequate for Discharge Goal: Diagnostic test results will improve Outcome: Adequate for Discharge Goal: Respiratory complications will improve Outcome: Adequate for Discharge Goal: Cardiovascular complication will be avoided Outcome: Adequate for Discharge   Problem: Activity: Goal: Risk for activity intolerance will decrease Outcome: Adequate for Discharge   Problem: Nutrition: Goal: Adequate nutrition will be maintained Outcome: Adequate for Discharge   Problem: Coping: Goal: Level of anxiety will decrease Outcome: Adequate for Discharge   Problem: Elimination: Goal: Will not experience complications related to bowel motility Outcome: Adequate for Discharge Goal: Will not experience complications related to urinary retention Outcome: Adequate for Discharge   Problem: Pain Managment: Goal: General experience of comfort will improve Outcome: Adequate for Discharge   Problem: Safety: Goal: Ability to remain free from injury will improve Outcome: Adequate for Discharge   Problem: Skin Integrity: Goal: Risk for impaired skin integrity will decrease Outcome: Adequate for Discharge  Haydee Salter, RN

## 2022-06-13 NOTE — Discharge Summary (Signed)
DISCHARGE SUMMARY  Jeanette Elliott  MR#: [id number]  DOB:1980/12/14  Date of Admission: 06/12/2022 Date of Discharge: 06/13/2022  Attending Physician:Jamilee Lafosse Silvestre Gunner, MD  Patient's WUJ:WJXB, Bertram Millard, MD  Consults: None  Disposition: Discharge home  Follow-up Appts:  Follow-up Information     Loyola Mast, MD Follow up in 5 day(s).   Specialty: Family Medicine Contact information: [address] [place] [address] [phone]                  Discharge Diagnoses: Ileitis with nausea vomiting and diarrhea lactic acidosis Acute kidney injury Hyponatremia Alcoholic cirrhosis of the liver Alcoholism Depression/anxiety Alcohol-induced polyneuropathy  Initial presentation: 41 year old with a history of alcoholic cirrhosis, alcoholism in remission, asthma, peripheral neuropathy, and depression/anxiety who presented to the ER with 24 hours of intractable nausea and vomiting.  She had not been able to tolerate oral intake.  This was followed by diarrhea.  In the ER she was found to have a blood pressure of 86/65 with a heart rate of 120.  WBC was elevated at 15.8.  CT abdomen noted inflammatory changes of the distal ileum.  Hospital Course:  Ileitis with nausea vomiting and diarrhea - lactic acidosis Clinically dramatically improved after volume resuscitation - lactic acidosis resolved with simple volume resuscitation -WBC has normalized -patient tolerating oral intake with no further nausea vomiting or diarrhea -cleared for discharge home   Acute kidney injury Most consistent with prerenal state given hypovolemia -hold Lasix and Aldactone until 06/16/2022   Hyponatremia Likely hypovolemic in etiology -greatly improved with volume resuscitation -06/16/2022   Alcoholic cirrhosis of the liver LFTs chronically elevated and appear to be at her baseline   Alcoholism Currently in remission   Depression/anxiety No changes were made to her usual  medical therapies at the time of her discharge   Alcohol-induced polyneuropathy Continue usual Lyrica dose    Allergies as of 06/13/2022       Reactions   Latex Itching, Rash   Iodine Itching   Keflex [cephalexin] Diarrhea, Nausea And Vomiting        Medication List     TAKE these medications    albuterol 108 (90 Base) MCG/ACT inhaler Commonly known as: VENTOLIN HFA Inhale 2 puffs into the lungs every 6 (six) hours as needed (asthma attacks).   Auvelity 45-105 MG Tbcr Generic drug: Dextromethorphan-buPROPion ER Take 1 tablet by mouth 2 (two) times daily.   cariprazine 1.5 MG capsule Commonly known as: Vraylar Take 1 capsule (1.5 mg total) by mouth daily.   cholecalciferol 25 MCG (1000 UNIT) tablet Commonly known as: VITAMIN D3 Take 1,000 Units by mouth daily.   cyanocobalamin 1000 MCG/ML injection Commonly known as: VITAMIN B12 Inject 1,000 mcg into the muscle every 30 (thirty) days.   EPINEPHrine 0.3 mg/0.3 mL Soaj injection Commonly known as: EPI-PEN Inject 0.3 mg into the muscle once as needed for anaphylaxis.   escitalopram 20 MG tablet Commonly known as: LEXAPRO Take 1 tablet (20 mg total) by mouth daily.   folic acid 1 MG tablet Commonly known as: FOLVITE TAKE 1 TABLET(1 MG) BY MOUTH DAILY What changed: See the new instructions.   furosemide 20 MG tablet Commonly known as: LASIX Take 1 tablet (20 mg total) by mouth daily. Start taking on: June 16, 2022 What changed: These instructions start on June 16, 2022. If you are unsure what to do until then, ask your doctor or other care provider.   hydrOXYzine 25 MG tablet Commonly known as:  ATARAX Take 1 tablet (25 mg total) by mouth 2 (two) times daily.   naltrexone 50 MG tablet Commonly known as: DEPADE Take 50 mg by mouth daily.   potassium chloride SA 20 MEQ tablet Commonly known as: KLOR-CON M Take 1 tablet (20 mEq total) by mouth daily.   pregabalin 25 MG capsule Commonly known as:  LYRICA Take 1 capsule (25 mg total) by mouth 2 (two) times daily.   spironolactone 50 MG tablet Commonly known as: ALDACTONE Take 1 tablet (50 mg total) by mouth daily. Start taking on: June 16, 2022 What changed:  See the new instructions. These instructions start on June 16, 2022. If you are unsure what to do until then, ask your doctor or other care provider.   SYSTANE FREE OP Apply 1 drop to eye daily as needed (dryness).   traZODone 50 MG tablet Commonly known as: DESYREL TAKE 1 TABLET(50 MG) BY MOUTH AT BEDTIME What changed: See the new instructions.   vitamin A 16109 UNIT capsule Take 25,000 Units by mouth daily.   ZOLMitriptan 2.5 MG tablet Commonly known as: Zomig Take 1 tablet (2.5 mg total) by mouth once for 1 dose. May repeat in 2 hours if headache persists or recurs.        Day of Discharge BP (!) 91/57 (BP Location: Left Arm)   Pulse 89   Temp 98.9 F (37.2 C) (Oral)   Resp 18   Ht 5\' 7"  (1.702 m)   Wt 65.8 kg   SpO2 99%   BMI 22.71 kg/m   Physical Exam: General: No acute respiratory distress Lungs: Clear to auscultation bilaterally without wheezes or crackles Cardiovascular: Regular rate and rhythm without murmur gallop or rub normal S1 and S2 Abdomen: Nontender, nondistended, soft, bowel sounds positive, no rebound, no ascites, no appreciable mass Extremities: No significant cyanosis, clubbing, or edema bilateral lower extremities  Basic Metabolic Panel: Recent Labs  Lab 06/12/22 1324 06/12/22 1346 06/12/22 2208 06/13/22 0529  NA 125* 121* 129* 133*  K 4.6 4.5  --  3.6  CL 85* 92*  --  102  CO2 10*  --   --  21*  GLUCOSE 115* 110*  --  116*  BUN 15 18  --  19  CREATININE 1.95* 1.80*  --  1.08*  CALCIUM 9.1  --   --  8.5*    Liver Function Tests: Recent Labs  Lab 06/12/22 1324 06/13/22 0529  AST 167* 124*  ALT 78* 56*  ALKPHOS 300* 198*  BILITOT 3.8* 1.9*  PROT 7.5 5.5*  ALBUMIN 4.1 3.1*    CBC: Recent Labs  Lab  06/12/22 1324 06/12/22 1346 06/13/22 0529  WBC 15.8*  --  10.5  NEUTROABS 12.3*  --   --   HGB 13.2 14.6 10.3*  HCT 38.9 43.0 29.4*  MCV 109.0*  --  103.2*  PLT 170  --  112*    Time spent in discharge (includes decision making & examination of pt): 30 minutes  06/13/2022, 11:44 AM   Lonia Blood, MD [facility]  [phone]

## 2022-06-13 NOTE — Progress Notes (Signed)
   06/13/22   To Whom it may concern,  Jeanette Elliott was hospitalized at [facility] from 06/12/2022 through 06/13/22.  She has been cleared to return to work on but not before 06/15/2022 with no medical restrictions.    Should you have further questions please feel free to contact our office.  Be advised that no medical information will be divulged without a signed HIPA release from the patient.    Sincerely,  Cherene Altes, MD [facility]  [phone]

## 2022-06-13 NOTE — Progress Notes (Signed)
Pt voices concerns for doctors notes for work

## 2022-06-14 ENCOUNTER — Telehealth: Payer: Self-pay | Admitting: Family Medicine

## 2022-06-14 NOTE — Telephone Encounter (Signed)
Pt is wanting an antacid script for her gas issues. She has an appointment for a hosp f/up with Dr. Gena Fray this Wednesday 06/16/22 concerning this issue. I let her know she may need to be seen beforehand to get a script.     [facility] [address] [place], [place]. AT [place]  [address] [place] [address]  Phone:  [phone]  Fax:  [phone]

## 2022-06-16 ENCOUNTER — Ambulatory Visit (INDEPENDENT_AMBULATORY_CARE_PROVIDER_SITE_OTHER): Payer: Commercial Managed Care - HMO | Admitting: Family Medicine

## 2022-06-16 ENCOUNTER — Ambulatory Visit: Payer: 59 | Admitting: Behavioral Health

## 2022-06-16 ENCOUNTER — Encounter: Payer: Self-pay | Admitting: Family Medicine

## 2022-06-16 VITALS — BP 116/68 | HR 94 | Temp 97.6°F | Ht 67.0 in | Wt 152.6 lb

## 2022-06-16 DIAGNOSIS — Z23 Encounter for immunization: Secondary | ICD-10-CM

## 2022-06-16 DIAGNOSIS — K529 Noninfective gastroenteritis and colitis, unspecified: Secondary | ICD-10-CM

## 2022-06-16 DIAGNOSIS — N179 Acute kidney failure, unspecified: Secondary | ICD-10-CM

## 2022-06-16 DIAGNOSIS — K7030 Alcoholic cirrhosis of liver without ascites: Secondary | ICD-10-CM | POA: Diagnosis not present

## 2022-06-16 MED ORDER — DICYCLOMINE HCL 10 MG PO CAPS: 10.0000 mg | ORAL_CAPSULE | Freq: Three times a day (TID) | ORAL | 1 refills | Status: DC

## 2022-06-16 NOTE — Progress Notes (Signed)
[facility] [address] [place] [address] Dept: [phone] Dept Fax: [phone]  Office Visit  Subjective:    Patient ID: Jeanette Elliott, female    DOB: [birth year], 41 y.o..   MRN: [id number]  Chief Complaint  Patient presents with   Hospitalization [facility] f/u from 06/12/22.  C/o still having upper LT abdomen/radiating into her back.  Has been using Gas-X .       History of Present Illness:  Patient is in today for follow up from her recent hospitalization. She was admitted at [facility] from 9/30-10/09/2021 with an acute ileitis and acute kidney injury consistent with dehydration. This was complicated by her alcoholic cirrhosis. She apparently responded rapidly to IV fluids.  Since returning home, she has noted a poor appetite. She states that fruits have been the only thing she has felt like eating at all. She continues to get some crampy abdominal pain with this. Despite her discomfort, she has tried to go to work. She is currently working at [employer] at [employer]. 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 She notes she was instructed to hold her diuretic for a few days after discharge. She is hesitant to start this back in light of her ongoing poor po intake.   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   Suicidal ideation 12/17/2021   MDD (major depressive disorder), recurrent, severe, with psychosis ([place]) 12/17/2021   Goiter 12/09/2021   Cholelithiasis 09/14/2021   Peripheral vascular disease ([place]) 09/09/2021   Osteopenia determined by x-ray 09/09/2021   Hip impingement syndrome, left 06/18/2021   Sacroiliac joint  dysfunction of right side 06/18/2021   B12 deficiency 05/06/2021   Normocytic anemia 05/06/2021   Duodenal ulcer 05/05/2021   Asthma 05/05/2021   Hymenoptera allergy 05/05/2021   Alcohol dependence in remission ([place]) 03/04/2021   Anxiety    Cirrhosis ([place]) 02/02/2021   S/P thoracentesis    Closed fracture of rib of left side with routine healing    Fall    Dysplasia of cervix, low grade (CIN 1) 09/22/2016   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   Sjogren's syndrome ([place]) 07/20/2016   Past Surgical History:  Procedure Laterality Date   IR PARACENTESIS  02/03/2021   IR THORACENTESIS ASP PLEURAL SPACE W/IMG GUIDE  01/16/2021   ORIF FINGER / THUMB FRACTURE Left    Thumb   SUBMANDIBULAR GLAND EXCISION     TONSILLECTOMY AND ADENOIDECTOMY     Family History  Problem Relation Age of Onset   Arthritis Mother    Heart disease Father    Cancer Father        Prostate   Arthritis Father    Heart disease Maternal Grandmother    Stroke Maternal Grandmother    Depression Maternal Grandmother    Alzheimer's disease Maternal Grandfather    Heart disease Paternal Grandfather    Heart disease Paternal Uncle    Colon cancer Neg Hx    Esophageal cancer Neg Hx    Outpatient Medications Prior to Visit  Medication Sig Dispense Refill   albuterol (VENTOLIN HFA) 108 (90 Base) MCG/ACT inhaler Inhale 2 puffs into the lungs every 6 (six) hours as needed (  asthma attacks). 8 g 1   Beta Carotene (VITAMIN A) 25000 UNIT capsule Take 25,000 Units by mouth daily.     cariprazine (VRAYLAR) 1.5 MG capsule Take 1 capsule (1.5 mg total) by mouth daily. 30 capsule 2   cholecalciferol (VITAMIN D3) 25 MCG (1000 UNIT) tablet Take 1,000 Units by mouth daily.     cyanocobalamin (,VITAMIN B-12,) 1000 MCG/ML injection Inject 1,000 mcg into the muscle every 30 (thirty) days.     Dextromethorphan-buPROPion ER (AUVELITY) 45-105 MG TBCR Take 1 tablet by mouth 2 (two) times daily. 60 tablet 3   EPINEPHrine  0.3 mg/0.3 mL IJ SOAJ injection Inject 0.3 mg into the muscle once as needed for anaphylaxis. 1 each 0   escitalopram (LEXAPRO) 20 MG tablet Take 1 tablet (20 mg total) by mouth daily. 90 tablet 2   folic acid (FOLVITE) 1 MG tablet TAKE 1 TABLET(1 MG) BY MOUTH DAILY (Patient taking differently: Take 1 mg by mouth daily.) 90 tablet 1   hydrOXYzine (ATARAX) 25 MG tablet Take 1 tablet (25 mg total) by mouth 2 (two) times daily. 60 tablet 1   naltrexone (DEPADE) 50 MG tablet Take 50 mg by mouth daily.     Polyethyl Glycol-Propyl Glycol (SYSTANE FREE OP) Apply 1 drop to eye daily as needed (dryness).     pregabalin (LYRICA) 25 MG capsule Take 1 capsule (25 mg total) by mouth 2 (two) times daily. 60 capsule 2   spironolactone (ALDACTONE) 50 MG tablet Take 1 tablet (50 mg total) by mouth daily.     traZODone (DESYREL) 50 MG tablet TAKE 1 TABLET(50 MG) BY MOUTH AT BEDTIME (Patient taking differently: Take 50 mg by mouth at bedtime as needed for sleep.) 30 tablet 0   furosemide (LASIX) 20 MG tablet Take 1 tablet (20 mg total) by mouth daily. (Patient not taking: Reported on 06/16/2022) 90 tablet 3   potassium chloride SA (KLOR-CON) 20 MEQ tablet Take 1 tablet (20 mEq total) by mouth daily. 30 tablet 0   ZOLMitriptan (ZOMIG) 2.5 MG tablet Take 1 tablet (2.5 mg total) by mouth once for 1 dose. May repeat in 2 hours if headache persists or recurs. 20 tablet 3   No facility-administered medications prior to visit.   Allergies  Allergen Reactions   Latex Itching and Rash   Iodine Itching   Keflex [Cephalexin] Diarrhea and Nausea And Vomiting     Objective:   Today's Vitals   06/16/22 1123  BP: 116/68  Pulse: 94  Temp: 97.6 F (36.4 C)  TempSrc: Temporal  SpO2: 99%  Weight: 152 lb 9.6 oz (69.2 kg)  Height: 5' 7"  (1.702 m)   Body mass index is 23.9 kg/m.   General: Well developed, well nourished. No acute distress. Abdomen: Soft. Mild to moderate tenderness across the upper abdomen.  No rebound  or   guarding. Bowel sounds positive, normal pitch and frequency. No hepatosplenomegaly. Extremities: Trace to 1+ lower leg edema.  Psych: Alert and oriented. Normal mood and affect.  Health Maintenance Due  Topic Date Due   PAP SMEAR-Modifier  Never done   Lab Results Last CBC Lab Results  Component Value Date   WBC 10.5 06/13/2022   HGB 10.3 (L) 06/13/2022   HCT 29.4 (L) 06/13/2022   MCV 103.2 (H) 06/13/2022   MCH 36.1 (H) 06/13/2022   RDW 13.4 06/13/2022   PLT 112 (L) 30/03/6225   Last metabolic panel Lab Results  Component Value Date   GLUCOSE 116 (H) 06/13/2022  NA 133 (L) 06/13/2022   K 3.6 06/13/2022   CL 102 06/13/2022   CO2 21 (L) 06/13/2022   BUN 19 06/13/2022   CREATININE 1.08 (H) 06/13/2022   GFRNONAA >60 06/13/2022   CALCIUM 8.5 (L) 06/13/2022   PHOS 5.9 (H) 05/05/2021   PROT 5.5 (L) 06/13/2022   ALBUMIN 3.1 (L) 06/13/2022   LABGLOB 3.3 05/20/2021   BILITOT 1.9 (H) 06/13/2022   ALKPHOS 198 (H) 06/13/2022   AST 124 (H) 06/13/2022   ALT 56 (H) 06/13/2022   ANIONGAP 10 06/13/2022   Component Ref Range & Units 3 d ago (06/13/22) 4 d ago (06/12/22) 9 mo ago (09/09/21) 1 yr ago (02/24/21) 1 yr ago (02/20/21) 1 yr ago (02/02/21) 1 yr ago (01/11/21)  Prothrombin Time 11.4 - 15.2 seconds 15.7 High   15.6 High   10.5 R, CM  15.3 High   15.4 High   16.4 High   15.7 High    INR 0.8 - 1.2 1.3 High   1.3 High  CM  1.0 R, CM  1.2 CM  1.2 CM  1.3 High  CM  1.3 High    Imaging: CT of Abdomen and Pelvis w contrast (06/12/2022) IMPRESSION: 1. There are inflammatory changes of the distal ileum. This could reflect a nonspecific ileitis (infectious or inflammatory). 2. Cirrhosis with sequela of portal venous hypertension. 3. Esophageal wall thickening likely reflecting underlying esophagitis. 4. New calyceal calcification of the inferior pole of the RIGHT kidney with differential considerations including nonobstructing nephrolithiasis or sequela of renal papillary  necrosis. 5. Age advanced atherosclerotic calcifications of the superficial femoral arteries.  Assessment & Plan:   1. Ileitis Ms. Lueth is recovering from the recent bout of ileitis. I will repeat labs from her recent admission to see if these are improving. I will provide dicyclomine to try and reduce cramping. I will refer her back to Dr. Bryan Lemma related to both her cirrhosis and the new issue with ileitis.  - CBC - Comprehensive metabolic panel - Ambulatory referral to Gastroenterology - dicyclomine (BENTYL) 10 MG capsule; Take 1 capsule (10 mg total) by mouth 4 (four) times daily -  before meals and at bedtime.  Dispense: 40 capsule; Refill: 1  2. Alcoholic cirrhosis of liver without ascites ([place]) As above. If repeat labs continue to show increased MELD score, she may need to be seen back by the [facility] transplant team.  - CBC - Comprehensive metabolic panel - Ambulatory referral to Gastroenterology  3. AKI (acute kidney injury) ([place]) We will reassess. I agree with her continuing to hold her diuretics for now.  - Comprehensive metabolic panel  4. Need for influenza vaccination  - Flu Vaccine QUAD 6+ mos PF IM (Fluarix Quad PF)   Return for As scheduled.   Haydee Salter, MD

## 2022-06-17 ENCOUNTER — Other Ambulatory Visit (INDEPENDENT_AMBULATORY_CARE_PROVIDER_SITE_OTHER): Payer: Commercial Managed Care - HMO

## 2022-06-17 ENCOUNTER — Telehealth: Payer: Self-pay | Admitting: Gastroenterology

## 2022-06-17 DIAGNOSIS — N179 Acute kidney failure, unspecified: Secondary | ICD-10-CM | POA: Diagnosis not present

## 2022-06-17 DIAGNOSIS — K7030 Alcoholic cirrhosis of liver without ascites: Secondary | ICD-10-CM | POA: Diagnosis not present

## 2022-06-17 DIAGNOSIS — K529 Noninfective gastroenteritis and colitis, unspecified: Secondary | ICD-10-CM

## 2022-06-17 LAB — CBC
HCT: 35 % — ABNORMAL LOW (ref 36.0–46.0)
Hemoglobin: 12.4 g/dL (ref 12.0–15.0)
MCHC: 35.4 g/dL (ref 30.0–36.0)
MCV: 105.7 fl — ABNORMAL HIGH (ref 78.0–100.0)
Platelets: 172 10*3/uL (ref 150.0–400.0)
RBC: 3.32 Mil/uL — ABNORMAL LOW (ref 3.87–5.11)
RDW: 14.1 % (ref 11.5–15.5)
WBC: 7.2 10*3/uL (ref 4.0–10.5)

## 2022-06-17 LAB — COMPREHENSIVE METABOLIC PANEL
ALT: 49 U/L — ABNORMAL HIGH (ref 0–35)
AST: 90 U/L — ABNORMAL HIGH (ref 0–37)
Albumin: 4 g/dL (ref 3.5–5.2)
Alkaline Phosphatase: 328 U/L — ABNORMAL HIGH (ref 39–117)
BUN: 4 mg/dL — ABNORMAL LOW (ref 6–23)
CO2: 31 mEq/L (ref 19–32)
Calcium: 9.5 mg/dL (ref 8.4–10.5)
Chloride: 98 mEq/L (ref 96–112)
Creatinine, Ser: 0.7 mg/dL (ref 0.40–1.20)
GFR: 108.55 mL/min (ref >60.00)
Glucose, Bld: 109 mg/dL — ABNORMAL HIGH (ref 70–99)
Potassium: 3 mEq/L — ABNORMAL LOW (ref 3.5–5.1)
Sodium: 137 mEq/L (ref 135–145)
Total Bilirubin: 1.2 mg/dL (ref 0.2–1.2)
Total Protein: 6.7 g/dL (ref 6.0–8.3)

## 2022-06-17 NOTE — Telephone Encounter (Signed)
Good afternoon Dr. Bryan Lemma,  Patient called wanting to schedule a office visit with you. After looking at her chart she was see on 04/14/21 by [facility]. Will you please review her record in epic, and advise on scheduling?  Thank you.

## 2022-06-17 NOTE — Telephone Encounter (Signed)
Patient seen in office on 06/16/22. Dm/cma

## 2022-06-17 NOTE — Telephone Encounter (Signed)
Patient had been previously seen by me, but was referred by me due to advanced liver disease, and since then has been following with Dr. Edison Pace at [facility].  Based on her complex underlying liver disease, I strongly recommend continuing follow-up with the subject matter expert at [facility].

## 2022-06-17 NOTE — Addendum Note (Signed)
Addended by: Beryle Lathe S on: 06/17/2022 09:27 AM   Modules accepted: Orders

## 2022-06-22 ENCOUNTER — Encounter: Payer: Self-pay | Admitting: Behavioral Health

## 2022-06-22 ENCOUNTER — Ambulatory Visit (INDEPENDENT_AMBULATORY_CARE_PROVIDER_SITE_OTHER): Payer: Commercial Managed Care - HMO | Admitting: Behavioral Health

## 2022-06-22 DIAGNOSIS — F411 Generalized anxiety disorder: Secondary | ICD-10-CM

## 2022-06-22 DIAGNOSIS — F331 Major depressive disorder, recurrent, moderate: Secondary | ICD-10-CM | POA: Diagnosis not present

## 2022-06-22 MED ORDER — ESCITALOPRAM OXALATE 20 MG PO TABS: 20.0000 mg | ORAL_TABLET | Freq: Every day | ORAL | 2 refills | Status: AC

## 2022-06-22 MED ORDER — CARIPRAZINE HCL 1.5 MG PO CAPS: 1.5000 mg | ORAL_CAPSULE | Freq: Every day | ORAL | 3 refills | Status: AC

## 2022-06-22 MED ORDER — AUVELITY 45-105 MG PO TBCR: 1.0000 | EXTENDED_RELEASE_TABLET | Freq: Two times a day (BID) | ORAL | 3 refills | Status: DC

## 2022-06-22 NOTE — Progress Notes (Signed)
[facility]  Patient ID: Jeanette Elliott,  MRN: [id number]  PCP: Haydee Salter, MD  Date of Evaluation: 06/22/2022 Time spent:30 minutes  Chief Complaint:  Chief Complaint   Anxiety; Depression; Follow-up; Medication Refill; Alcohol Problem; Patient Education     HISTORY/CURRENT STATUS: HPI  41 year old female presented for follow up and medication management.   Her speech is clear and concise today. She is logical and appears to be sober. I do have concerns about her ability to appear sober but has consumed. Her face is flush this visit with  bloodshot eyes. She denies ETOH and says that she continues with AA.Marland Kitchen She says she will follow up with specialist for abnormal kidney function with recent labs on 10/1. For now, she will continue with current medications and no changes this visit.  Anxiety today is 2/10 and depression 2/10. She has been sleeping 7-8  hours per night with Trazodone. She denies current mania, no psychosis, No current SI.  No HI. Patient verbally contracted for safety and will be staying with parents until she improves.  Will f/u with this office after she completes IOP.      Venlafaxine Wellbutrin  Paxil   Individual Medical History/ Review of Systems: Changes? :No   Allergies: Latex, Iodine, and Keflex [cephalexin]  Current Medications:  Current Outpatient Medications:    albuterol (VENTOLIN HFA) 108 (90 Base) MCG/ACT inhaler, Inhale 2 puffs into the lungs every 6 (six) hours as needed (asthma attacks)., Disp: 8 g, Rfl: 1   Beta Carotene (VITAMIN A) 25000 UNIT capsule, Take 25,000 Units by mouth daily., Disp: , Rfl:    cariprazine (VRAYLAR) 1.5 MG capsule, Take 1 capsule (1.5 mg total) by mouth daily., Disp: 30 capsule, Rfl: 3   cholecalciferol (VITAMIN D3) 25 MCG (1000 UNIT) tablet, Take 1,000 Units by mouth daily., Disp: , Rfl:    cyanocobalamin (,VITAMIN B-12,) 1000 MCG/ML injection, Inject 1,000 mcg into the muscle every 30 (thirty) days.,  Disp: , Rfl:    Dextromethorphan-buPROPion ER (AUVELITY) 45-105 MG TBCR, Take 1 tablet by mouth 2 (two) times daily., Disp: 60 tablet, Rfl: 3   dicyclomine (BENTYL) 10 MG capsule, Take 1 capsule (10 mg total) by mouth 4 (four) times daily -  before meals and at bedtime., Disp: 40 capsule, Rfl: 1   EPINEPHrine 0.3 mg/0.3 mL IJ SOAJ injection, Inject 0.3 mg into the muscle once as needed for anaphylaxis., Disp: 1 each, Rfl: 0   escitalopram (LEXAPRO) 20 MG tablet, Take 1 tablet (20 mg total) by mouth daily., Disp: 90 tablet, Rfl: 2   folic acid (FOLVITE) 1 MG tablet, TAKE 1 TABLET(1 MG) BY MOUTH DAILY (Patient taking differently: Take 1 mg by mouth daily.), Disp: 90 tablet, Rfl: 1   furosemide (LASIX) 20 MG tablet, Take 1 tablet (20 mg total) by mouth daily. (Patient not taking: Reported on 06/16/2022), Disp: 90 tablet, Rfl: 3   hydrOXYzine (ATARAX) 25 MG tablet, Take 1 tablet (25 mg total) by mouth 2 (two) times daily., Disp: 60 tablet, Rfl: 1   naltrexone (DEPADE) 50 MG tablet, Take 50 mg by mouth daily., Disp: , Rfl:    Polyethyl Glycol-Propyl Glycol (SYSTANE FREE OP), Apply 1 drop to eye daily as needed (dryness)., Disp: , Rfl:    potassium chloride SA (KLOR-CON) 20 MEQ tablet, Take 1 tablet (20 mEq total) by mouth daily., Disp: 30 tablet, Rfl: 0   pregabalin (LYRICA) 25 MG capsule, Take 1 capsule (25 mg total) by mouth 2 (two)  times daily., Disp: 60 capsule, Rfl: 2   spironolactone (ALDACTONE) 50 MG tablet, Take 1 tablet (50 mg total) by mouth daily., Disp: , Rfl:    traZODone (DESYREL) 50 MG tablet, TAKE 1 TABLET(50 MG) BY MOUTH AT BEDTIME (Patient taking differently: Take 50 mg by mouth at bedtime as needed for sleep.), Disp: 30 tablet, Rfl: 0   ZOLMitriptan (ZOMIG) 2.5 MG tablet, Take 1 tablet (2.5 mg total) by mouth once for 1 dose. May repeat in 2 hours if headache persists or recurs., Disp: 20 tablet, Rfl: 3 Medication Side Effects: none  Family Medical/ Social History: Changes? No  MENTAL  HEALTH EXAM:  There were no vitals taken for this visit.There is no height or weight on file to calculate BMI.  General Appearance: Casual and Neat  Eye Contact:  Good  Speech:  Clear and Coherent  Volume:  Normal  Mood:  Anxious  Affect:  Appropriate  Thought Process:  Coherent  Orientation:  Full (Time, Place, and Person)  Thought Content: Logical   Suicidal Thoughts:  No  Homicidal Thoughts:  No  Memory:  WNL  Judgement:  Fair  Insight:  Fair  Psychomotor Activity:  Normal  Concentration:  Concentration: Fair  Recall:  [place] of Knowledge: Good  Language: Fair  Assets:  Desire for Improvement  ADL's:  Intact  Cognition: WNL  Prognosis:  Poor    DIAGNOSES:    ICD-10-CM   1. Generalized anxiety disorder  F41.1 Dextromethorphan-buPROPion ER (AUVELITY) 45-105 MG TBCR    cariprazine (VRAYLAR) 1.5 MG capsule    escitalopram (LEXAPRO) 20 MG tablet    2. Major depressive disorder, recurrent episode, moderate (HCC)  F33.1 Dextromethorphan-buPROPion ER (AUVELITY) 45-105 MG TBCR    cariprazine (VRAYLAR) 1.5 MG capsule    escitalopram (LEXAPRO) 20 MG tablet      Receiving Psychotherapy: Yes    RECOMMENDATIONS:      Greater than 50% of 30  min face to face interview with patient was spent on counseling and coordination of care. Says she is still participating in [place].  We discusssed her recent hospital admission. Her AST, ALT, elevated. Her Creatinine was also elevated. She was unsure about poc for her kidney function. Recommend she consult with kidney specialist/nephrology. She should also have her medications reviewed by specialist. She says that she has not been drinking. Her face was flush this visit and eyes appeared bloodshot. Her presentation was normal but I have reservations. She wants to continue her current medication for now and had adequate supply until further assessment.     Continue Lexapro 20 mg daily Continue Vraylar 1.5 mg daily.  Continue Auvelity 90/210  daily Continue Trazodone 100 mg at bedtime. May take half tablet if to somnolent in the am.  Will continue in residential treatment or IOP at [facility] and remain in [place].  Educated on good sleep hygiene Provided emergency contact info Discussed potential metabolic side effects associated with atypical antipsychotics, as well as potential risk for movement side effects. Advised pt to contact office if movement side effects occur.   Further explained risk of heavy ETOH and psychotropic medication. Explained how ETOH is a depressant and would further worsen her symptoms Provided emergency contact information and instructed mother that PT should be supervised if possible. Call 911 in emergency or if pt is in danger, threatening to harm self or others.  Reviewed PDMP  [name], NP

## 2022-06-22 NOTE — Telephone Encounter (Signed)
Dr. Bryan Lemma,  I advised patient of your recommendations. She stated that [facility] is not in network with her insurance and wanted to know if you would reconsider the denial. Please advise.

## 2022-06-25 ENCOUNTER — Other Ambulatory Visit: Payer: Self-pay | Admitting: Family Medicine

## 2022-06-25 DIAGNOSIS — G629 Polyneuropathy, unspecified: Secondary | ICD-10-CM

## 2022-06-25 NOTE — Telephone Encounter (Signed)
Patient has been scheduled for OV on 10/24 at 1:40 with Dr. Bryan Lemma

## 2022-07-05 ENCOUNTER — Ambulatory Visit (INDEPENDENT_AMBULATORY_CARE_PROVIDER_SITE_OTHER): Payer: Commercial Managed Care - HMO | Admitting: Psychiatry

## 2022-07-05 DIAGNOSIS — F411 Generalized anxiety disorder: Secondary | ICD-10-CM

## 2022-07-05 NOTE — Progress Notes (Signed)
[facility] Counselor/Therapist Progress Note  Patient ID: Jeanette Elliott, MRN: [id number],    Date: 07/05/2022  Time Spent: 50 minutes start time 1:16 PM end time 2:06 PM  Treatment Type: Individual Therapy  Reported Symptoms: health issues, anxiety, sadness, crying spells, panic, triggered responses  Mental Status Exam:  Appearance:   Casual and Neat     Behavior:  Appropriate  Motor:  Normal  Speech/Language:   Normal Rate  Affect:  Appropriate and Tearful  Mood:  anxious and sad  Thought process:  circumstantial  Thought content:    WNL  Sensory/Perceptual disturbances:    WNL  Orientation:  oriented to person, place, time/date, and situation  Attention:  Good  Concentration:  Good  Memory:  Immediate;   [place] of knowledge:   Good  Insight:    Good  Judgment:   Good  Impulse Control:  Good   Risk Assessment: Danger to Self:  No Self-injurious Behavior: No Danger to Others: No Duty to Warn:no Physical Aggression / Violence:No  Access to Firearms a concern: No  Gang Involvement:No   Subjective: Patient was present for session.  She shared she was in the hospital 2 weeks ago. She shared she had ileitis and it was real bad.She went on to share that her best friend is in ICU.  Patient also shared that she ripped received multiple text messages from a guy that she was breaking things off with and they were very disturbing and difficult for her even in the message he told her to kill herself.  Patient did call the sheriff to see if there was anything that could be done and they contacted him.  Patient stated she has not heard from him since them but has read the things that he texted to her that were very mean.  Patient was encouraged to realize she has to stop reading the messages because they are not the truth and they are putting things in her head that she does not need.  Patient stated she did not want to delete the messages just in case something were to happen  she wanted the documentation.  She did agree to stop reading it since it has been upsetting for her.  Patient also shared that she went to see her provider and he did not want to prescribe or refill medications due to her lab work that was taken while she was in the hospital which showed that her kidneys and liver levels were elevated.  Patient stated she has had lab work since then that showed everything is normal but for some reason he did not read that lab work that was in her chart.  Patient stated she is not sure what to do about her medication.  Discussed different options and patient decided that she may talk to people with the [facility] since she is going to go there for follow-up anyways.  Patient was encouraged to make sure she does something for herself since her mood was sad in session.  Patient also explained that there are difficulties at work.  Discussed that situation and ways to manage the stress there as well.    Interventions: Solution-Oriented/Positive Psychology  Diagnosis:   ICD-10-CM   1. Generalized anxiety disorder  F41.1       Plan: Patient is to use CBT and coping skills to decrease anxiety symptoms.  Patient is to do a brain dump at night and write as quickly as she can for a  few minutes then close the journal.  Patient is to consider using brain spotting bilateral music to calm herself as needed.  Patient is to start going to the gym regularly and exercising.  Patient is also to continue going to the [facility] for substance abuse groups and AA meetings.  Patient is to work on her self talk and focus on the things that she wants in life and ways to achieve those goals.  Patient is to take medication as directed. Long term goal: Resolve the core conflict that is the source of anxiety Short term goal: Identify the major life conflicts from the past and present that form the basis for present anxiety    [name], [facility]

## 2022-07-05 NOTE — Telephone Encounter (Signed)
Patient called to cancel her appt, one day in advance. States she was going to go see another GI provider.

## 2022-07-06 ENCOUNTER — Ambulatory Visit: Payer: Commercial Managed Care - HMO | Admitting: Gastroenterology

## 2022-07-06 NOTE — Telephone Encounter (Signed)
Understood. Thanks for the update.

## 2022-07-13 ENCOUNTER — Ambulatory Visit: Payer: 59 | Admitting: Family Medicine

## 2022-07-30 ENCOUNTER — Emergency Department (HOSPITAL_COMMUNITY): Payer: Commercial Managed Care - HMO

## 2022-07-30 ENCOUNTER — Inpatient Hospital Stay (HOSPITAL_COMMUNITY)
Admission: EM | Admit: 2022-07-30 | Discharge: 2022-08-01 | DRG: 641 | Disposition: A | Discharge status: Home / Self Care | Payer: Commercial Managed Care - HMO | Attending: Internal Medicine | Admitting: Internal Medicine

## 2022-07-30 ENCOUNTER — Other Ambulatory Visit: Payer: Self-pay

## 2022-07-30 ENCOUNTER — Encounter (HOSPITAL_COMMUNITY): Payer: Self-pay

## 2022-07-30 DIAGNOSIS — Z8249 Family history of ischemic heart disease and other diseases of the circulatory system: Secondary | ICD-10-CM

## 2022-07-30 DIAGNOSIS — M3500 Sicca syndrome, unspecified: Secondary | ICD-10-CM | POA: Diagnosis present

## 2022-07-30 DIAGNOSIS — F1010 Alcohol abuse, uncomplicated: Secondary | ICD-10-CM | POA: Diagnosis present

## 2022-07-30 DIAGNOSIS — F319 Bipolar disorder, unspecified: Secondary | ICD-10-CM | POA: Diagnosis present

## 2022-07-30 DIAGNOSIS — Z8261 Family history of arthritis: Secondary | ICD-10-CM | POA: Diagnosis not present

## 2022-07-30 DIAGNOSIS — K7460 Unspecified cirrhosis of liver: Secondary | ICD-10-CM | POA: Diagnosis present

## 2022-07-30 DIAGNOSIS — L309 Dermatitis, unspecified: Secondary | ICD-10-CM | POA: Diagnosis present

## 2022-07-30 DIAGNOSIS — Z91041 Radiographic dye allergy status: Secondary | ICD-10-CM

## 2022-07-30 DIAGNOSIS — F1090 Alcohol use, unspecified, uncomplicated: Secondary | ICD-10-CM | POA: Diagnosis not present

## 2022-07-30 DIAGNOSIS — E875 Hyperkalemia: Secondary | ICD-10-CM | POA: Diagnosis present

## 2022-07-30 DIAGNOSIS — G6289 Other specified polyneuropathies: Secondary | ICD-10-CM | POA: Diagnosis present

## 2022-07-30 DIAGNOSIS — D539 Nutritional anemia, unspecified: Secondary | ICD-10-CM | POA: Diagnosis present

## 2022-07-30 DIAGNOSIS — E876 Hypokalemia: Secondary | ICD-10-CM | POA: Diagnosis present

## 2022-07-30 DIAGNOSIS — Y904 Blood alcohol level of 80-99 mg/100 ml: Secondary | ICD-10-CM | POA: Diagnosis present

## 2022-07-30 DIAGNOSIS — F411 Generalized anxiety disorder: Secondary | ICD-10-CM

## 2022-07-30 DIAGNOSIS — N390 Urinary tract infection, site not specified: Principal | ICD-10-CM

## 2022-07-30 DIAGNOSIS — Z820 Family history of epilepsy and other diseases of the nervous system: Secondary | ICD-10-CM | POA: Diagnosis not present

## 2022-07-30 DIAGNOSIS — K130 Diseases of lips: Secondary | ICD-10-CM | POA: Diagnosis present

## 2022-07-30 DIAGNOSIS — F419 Anxiety disorder, unspecified: Secondary | ICD-10-CM | POA: Diagnosis present

## 2022-07-30 DIAGNOSIS — K760 Fatty (change of) liver, not elsewhere classified: Secondary | ICD-10-CM | POA: Diagnosis present

## 2022-07-30 DIAGNOSIS — E8889 Other specified metabolic disorders: Secondary | ICD-10-CM | POA: Diagnosis present

## 2022-07-30 DIAGNOSIS — E872 Acidosis, unspecified: Principal | ICD-10-CM | POA: Diagnosis present

## 2022-07-30 DIAGNOSIS — Z91040 Latex allergy status: Secondary | ICD-10-CM

## 2022-07-30 DIAGNOSIS — Z823 Family history of stroke: Secondary | ICD-10-CM | POA: Diagnosis not present

## 2022-07-30 DIAGNOSIS — K140 Glossitis: Secondary | ICD-10-CM | POA: Diagnosis present

## 2022-07-30 DIAGNOSIS — K7010 Alcoholic hepatitis without ascites: Secondary | ICD-10-CM | POA: Diagnosis present

## 2022-07-30 DIAGNOSIS — R109 Unspecified abdominal pain: Secondary | ICD-10-CM | POA: Diagnosis present

## 2022-07-30 DIAGNOSIS — R000 Tachycardia, unspecified: Secondary | ICD-10-CM

## 2022-07-30 DIAGNOSIS — E8729 Other acidosis: Secondary | ICD-10-CM

## 2022-07-30 DIAGNOSIS — D696 Thrombocytopenia, unspecified: Secondary | ICD-10-CM | POA: Diagnosis present

## 2022-07-30 DIAGNOSIS — R0609 Other forms of dyspnea: Secondary | ICD-10-CM | POA: Diagnosis not present

## 2022-07-30 DIAGNOSIS — Z888 Allergy status to other drugs, medicaments and biological substances status: Secondary | ICD-10-CM | POA: Diagnosis not present

## 2022-07-30 DIAGNOSIS — F331 Major depressive disorder, recurrent, moderate: Secondary | ICD-10-CM

## 2022-07-30 DIAGNOSIS — Z79899 Other long term (current) drug therapy: Secondary | ICD-10-CM

## 2022-07-30 DIAGNOSIS — K209 Esophagitis, unspecified without bleeding: Secondary | ICD-10-CM

## 2022-07-30 DIAGNOSIS — R7989 Other specified abnormal findings of blood chemistry: Secondary | ICD-10-CM

## 2022-07-30 DIAGNOSIS — E538 Deficiency of other specified B group vitamins: Secondary | ICD-10-CM | POA: Diagnosis present

## 2022-07-30 LAB — COMPREHENSIVE METABOLIC PANEL
ALT: 50 U/L — ABNORMAL HIGH (ref 0–44)
AST: 116 U/L — ABNORMAL HIGH (ref 15–41)
Albumin: 4.5 g/dL (ref 3.5–5.0)
Alkaline Phosphatase: 369 U/L — ABNORMAL HIGH (ref 38–126)
BUN: 12 mg/dL (ref 6–20)
CO2: <7 mmol/L — ABNORMAL LOW (ref 22–32)
Calcium: 9.6 mg/dL (ref 8.9–10.3)
Chloride: 99 mmol/L (ref 98–111)
Creatinine, Ser: 1.02 mg/dL — ABNORMAL HIGH (ref 0.44–1.00)
GFR, Estimated: >60 mL/min (ref >60)
Glucose, Bld: 102 mg/dL — ABNORMAL HIGH (ref 70–99)
Potassium: 5.7 mmol/L — ABNORMAL HIGH (ref 3.5–5.1)
Sodium: 137 mmol/L (ref 135–145)
Total Bilirubin: 1.6 mg/dL — ABNORMAL HIGH (ref 0.3–1.2)
Total Protein: 8.1 g/dL (ref 6.5–8.1)

## 2022-07-30 LAB — CBC WITH DIFFERENTIAL/PLATELET
Abs Immature Granulocytes: 0 10*3/uL (ref 0.00–0.07)
Basophils Absolute: 0 10*3/uL (ref 0.0–0.1)
Basophils Relative: 0 %
Eosinophils Absolute: 0 10*3/uL (ref 0.0–0.5)
Eosinophils Relative: 0 %
HCT: 44.2 % (ref 36.0–46.0)
Hemoglobin: 14.3 g/dL (ref 12.0–15.0)
Lymphocytes Relative: 14 %
Lymphs Abs: 1.9 10*3/uL (ref 0.7–4.0)
MCH: 37.1 pg — ABNORMAL HIGH (ref 26.0–34.0)
MCHC: 32.4 g/dL (ref 30.0–36.0)
MCV: 114.8 fL — ABNORMAL HIGH (ref 80.0–100.0)
Monocytes Absolute: 1.6 10*3/uL — ABNORMAL HIGH (ref 0.1–1.0)
Monocytes Relative: 12 %
Neutro Abs: 9.8 10*3/uL — ABNORMAL HIGH (ref 1.7–7.7)
Neutrophils Relative %: 74 %
Platelets: 278 10*3/uL (ref 150–400)
RBC: 3.85 MIL/uL — ABNORMAL LOW (ref 3.87–5.11)
RDW: 13.8 % (ref 11.5–15.5)
WBC: 13.3 10*3/uL — ABNORMAL HIGH (ref 4.0–10.5)
nRBC: 0 /100 WBC
nRBC: 0.2 % (ref 0.0–0.2)

## 2022-07-30 LAB — GLUCOSE, CAPILLARY
Glucose-Capillary: 294 mg/dL — ABNORMAL HIGH (ref 70–99)
Glucose-Capillary: 259 mg/dL — ABNORMAL HIGH (ref 70–99)
Glucose-Capillary: 207 mg/dL — ABNORMAL HIGH (ref 70–99)

## 2022-07-30 LAB — I-STAT VENOUS BLOOD GAS, ED
Acid-base deficit: 30 mmol/L — ABNORMAL HIGH (ref 0.0–2.0)
Bicarbonate: 3.6 mmol/L — ABNORMAL LOW (ref 20.0–28.0)
Calcium, Ion: 1.09 mmol/L — ABNORMAL LOW (ref 1.15–1.40)
HCT: 44 % (ref 36.0–46.0)
Hemoglobin: 15 g/dL (ref 12.0–15.0)
O2 Saturation: 91 %
Potassium: 5.7 mmol/L — ABNORMAL HIGH (ref 3.5–5.1)
Sodium: 131 mmol/L — ABNORMAL LOW (ref 135–145)
TCO2: <5 mmol/L — ABNORMAL LOW (ref 22–32)
pCO2, Ven: 22 mmHg — ABNORMAL LOW (ref 44–60)
pH, Ven: 6.819 — CL (ref 7.25–7.43)
pO2, Ven: 107 mmHg — ABNORMAL HIGH (ref 32–45)

## 2022-07-30 LAB — LIPASE, BLOOD
Lipase: 29 U/L (ref 11–51)
Lipase: 44 U/L (ref 11–51)

## 2022-07-30 LAB — BETA-HYDROXYBUTYRIC ACID: Beta-Hydroxybutyric Acid: 4.78 mmol/L — ABNORMAL HIGH (ref 0.05–0.27)

## 2022-07-30 LAB — FOLATE: Folate: 3.4 ng/mL — ABNORMAL LOW (ref >5.9)

## 2022-07-30 LAB — URINALYSIS, ROUTINE W REFLEX MICROSCOPIC
Bilirubin Urine: NEGATIVE
Glucose, UA: NEGATIVE mg/dL
Ketones, ur: 20 mg/dL — AB
Leukocytes,Ua: NEGATIVE
Nitrite: NEGATIVE
Protein, ur: 100 mg/dL — AB
Specific Gravity, Urine: 1.013 (ref 1.005–1.030)
pH: 5 (ref 5.0–8.0)

## 2022-07-30 LAB — BASIC METABOLIC PANEL
BUN: 9 mg/dL (ref 6–20)
CO2: <7 mmol/L — ABNORMAL LOW (ref 22–32)
Calcium: 8.2 mg/dL — ABNORMAL LOW (ref 8.9–10.3)
Chloride: 98 mmol/L (ref 98–111)
Creatinine, Ser: 1.1 mg/dL — ABNORMAL HIGH (ref 0.44–1.00)
GFR, Estimated: >60 mL/min (ref >60)
Glucose, Bld: 274 mg/dL — ABNORMAL HIGH (ref 70–99)
Potassium: 5.9 mmol/L — ABNORMAL HIGH (ref 3.5–5.1)
Sodium: 131 mmol/L — ABNORMAL LOW (ref 135–145)

## 2022-07-30 LAB — I-STAT BETA HCG BLOOD, ED (MC, WL, AP ONLY): I-stat hCG, quantitative: <5 m[IU]/mL (ref <5)

## 2022-07-30 LAB — IRON AND TIBC
Iron: 336 ug/dL — ABNORMAL HIGH (ref 28–170)
Saturation Ratios: 90 % — ABNORMAL HIGH (ref 10.4–31.8)
TIBC: 375 ug/dL (ref 250–450)
UIBC: 39 ug/dL

## 2022-07-30 LAB — MRSA NEXT GEN BY PCR, NASAL: MRSA by PCR Next Gen: NOT DETECTED

## 2022-07-30 LAB — CK: Total CK: 78 U/L (ref 38–234)

## 2022-07-30 LAB — RAPID URINE DRUG SCREEN, HOSP PERFORMED
Amphetamines: NOT DETECTED
Barbiturates: NOT DETECTED
Benzodiazepines: NOT DETECTED
Cocaine: NOT DETECTED
Opiates: NOT DETECTED
Tetrahydrocannabinol: NOT DETECTED

## 2022-07-30 LAB — SEDIMENTATION RATE: Sed Rate: 1 mm/hr (ref 0–22)

## 2022-07-30 LAB — TROPONIN I (HIGH SENSITIVITY)
Troponin I (High Sensitivity): 17 ng/L (ref <18)
Troponin I (High Sensitivity): 23 ng/L — ABNORMAL HIGH (ref <18)

## 2022-07-30 LAB — LACTIC ACID, PLASMA
Lactic Acid, Venous: >9 mmol/L (ref 0.5–1.9)
Lactic Acid, Venous: >9 mmol/L (ref 0.5–1.9)

## 2022-07-30 LAB — SALICYLATE LEVEL: Salicylate Lvl: <7 mg/dL — ABNORMAL LOW (ref 7.0–30.0)

## 2022-07-30 LAB — ETHANOL: Alcohol, Ethyl (B): 97 mg/dL — ABNORMAL HIGH (ref <10)

## 2022-07-30 LAB — CORTISOL: Cortisol, Plasma: 49.9 ug/dL

## 2022-07-30 LAB — C-REACTIVE PROTEIN: CRP: 2.4 mg/dL — ABNORMAL HIGH (ref <1.0)

## 2022-07-30 LAB — VITAMIN B12: Vitamin B-12: 255 pg/mL (ref 180–914)

## 2022-07-30 LAB — FERRITIN: Ferritin: 119 ng/mL (ref 11–307)

## 2022-07-30 LAB — PHOSPHORUS: Phosphorus: 3.2 mg/dL (ref 2.5–4.6)

## 2022-07-30 LAB — PROCALCITONIN: Procalcitonin: 0.42 ng/mL

## 2022-07-30 LAB — VITAMIN D 25 HYDROXY (VIT D DEFICIENCY, FRACTURES): Vit D, 25-Hydroxy: 7.69 ng/mL — ABNORMAL LOW (ref 30–100)

## 2022-07-30 LAB — MAGNESIUM: Magnesium: 1.8 mg/dL (ref 1.7–2.4)

## 2022-07-30 MED ORDER — TRAZODONE HCL 50 MG PO TABS
50.0000 mg | ORAL_TABLET | Freq: Every day | ORAL | Status: DC
  Administered 2022-07-30 – 2022-07-31 (×2): 50 mg via ORAL
  Filled 2022-07-30 (×2): qty 1

## 2022-07-30 MED ORDER — FENTANYL CITRATE PF 50 MCG/ML IJ SOSY
50.0000 ug | PREFILLED_SYRINGE | Freq: Once | INTRAMUSCULAR | Status: AC
  Administered 2022-07-30: 50 ug via INTRAVENOUS
  Filled 2022-07-30: qty 1

## 2022-07-30 MED ORDER — ONDANSETRON 4 MG PO TBDP
4.0000 mg | ORAL_TABLET | Freq: Once | ORAL | Status: AC
  Administered 2022-07-30: 4 mg via ORAL
  Filled 2022-07-30: qty 1

## 2022-07-30 MED ORDER — PANTOPRAZOLE SODIUM 40 MG IV SOLR
40.0000 mg | Freq: Once | INTRAVENOUS | Status: AC
  Administered 2022-07-30: 40 mg via INTRAVENOUS
  Filled 2022-07-30: qty 10

## 2022-07-30 MED ORDER — THIAMINE HCL 100 MG/ML IJ SOLN
500.0000 mg | Freq: Every day | INTRAVENOUS | Status: DC
  Administered 2022-07-30 – 2022-08-01 (×3): 500 mg via INTRAVENOUS
  Filled 2022-07-30 (×4): qty 5

## 2022-07-30 MED ORDER — DOCUSATE SODIUM 100 MG PO CAPS: 100.0000 mg | ORAL_CAPSULE | Freq: Two times a day (BID) | ORAL | Status: DC | PRN

## 2022-07-30 MED ORDER — SODIUM CHLORIDE 0.9 % IV BOLUS
1000.0000 mL | Freq: Once | INTRAVENOUS | Status: AC
  Administered 2022-07-30: 1000 mL via INTRAVENOUS

## 2022-07-30 MED ORDER — DEXTROSE 5 % IV BOLUS
1000.0000 mL | Freq: Once | INTRAVENOUS | Status: AC
  Administered 2022-07-30: 1000 mL via INTRAVENOUS

## 2022-07-30 MED ORDER — SODIUM CHLORIDE 0.9 % IV BOLUS: 1000.0000 mL | Freq: Once | INTRAVENOUS | Status: DC

## 2022-07-30 MED ORDER — INSULIN ASPART 100 UNIT/ML IV SOLN
5.0000 [IU] | Freq: Once | INTRAVENOUS | Status: AC
  Administered 2022-07-30: 5 [IU] via INTRAVENOUS

## 2022-07-30 MED ORDER — PANTOPRAZOLE SODIUM 40 MG IV SOLR: 40.0000 mg | Freq: Every day | INTRAVENOUS | Status: DC

## 2022-07-30 MED ORDER — SODIUM ZIRCONIUM CYCLOSILICATE 10 G PO PACK
10.0000 g | PACK | Freq: Once | ORAL | Status: AC
  Administered 2022-07-30: 10 g via ORAL

## 2022-07-30 MED ORDER — POLYETHYLENE GLYCOL 3350 17 G PO PACK: 17.0000 g | PACK | Freq: Every day | ORAL | Status: DC | PRN

## 2022-07-30 MED ORDER — SODIUM BICARBONATE 8.4 % IV SOLN
INTRAVENOUS | Status: AC
  Filled 2022-07-30 (×3): qty 1000

## 2022-07-30 MED ORDER — CALCIUM GLUCONATE-NACL 1-0.675 GM/50ML-% IV SOLN
1.0000 g | Freq: Once | INTRAVENOUS | Status: AC
  Administered 2022-07-30: 1000 mg via INTRAVENOUS
  Filled 2022-07-30: qty 50

## 2022-07-30 MED ORDER — HEPARIN SODIUM (PORCINE) 5000 UNIT/ML IJ SOLN
5000.0000 [IU] | Freq: Three times a day (TID) | INTRAMUSCULAR | Status: DC
  Administered 2022-07-30 – 2022-08-01 (×5): 5000 [IU] via SUBCUTANEOUS
  Filled 2022-07-30 (×6): qty 1

## 2022-07-30 MED ORDER — MAGNESIUM SULFATE 2 GM/50ML IV SOLN
2.0000 g | Freq: Once | INTRAVENOUS | Status: AC
  Administered 2022-07-30: 2 g via INTRAVENOUS
  Filled 2022-07-30: qty 50

## 2022-07-30 MED ORDER — CHLORHEXIDINE GLUCONATE CLOTH 2 % EX PADS
6.0000 | MEDICATED_PAD | Freq: Every day | CUTANEOUS | Status: DC
  Administered 2022-07-31 – 2022-08-01 (×2): 6 via TOPICAL

## 2022-07-30 MED ORDER — ONDANSETRON HCL 4 MG/2ML IJ SOLN
4.0000 mg | Freq: Once | INTRAMUSCULAR | Status: AC
  Administered 2022-07-30: 4 mg via INTRAVENOUS
  Filled 2022-07-30: qty 2

## 2022-07-30 MED ORDER — DEXTROSE 50 % IV SOLN
1.0000 | Freq: Once | INTRAVENOUS | Status: AC
  Administered 2022-07-30: 50 mL via INTRAVENOUS
  Filled 2022-07-30: qty 50

## 2022-07-30 MED ORDER — FOLIC ACID 1 MG PO TABS
1.0000 mg | ORAL_TABLET | Freq: Every day | ORAL | Status: DC
  Administered 2022-07-30 – 2022-08-01 (×3): 1 mg via ORAL
  Filled 2022-07-30 (×3): qty 1

## 2022-07-30 MED ORDER — TRAMADOL HCL 50 MG PO TABS
50.0000 mg | ORAL_TABLET | Freq: Four times a day (QID) | ORAL | Status: DC | PRN
  Administered 2022-07-30: 50 mg via ORAL
  Filled 2022-07-30: qty 1

## 2022-07-30 MED ORDER — SODIUM CHLORIDE 0.9 % IV SOLN: INTRAVENOUS | Status: DC

## 2022-07-30 MED ORDER — ADULT MULTIVITAMIN W/MINERALS CH
1.0000 | ORAL_TABLET | Freq: Every day | ORAL | Status: DC
  Administered 2022-07-30 – 2022-08-01 (×3): 1 via ORAL
  Filled 2022-07-30 (×3): qty 1

## 2022-07-30 MED ORDER — INSULIN ASPART 100 UNIT/ML IJ SOLN
0.0000 [IU] | INTRAMUSCULAR | Status: DC
  Administered 2022-07-30: 5 [IU] via SUBCUTANEOUS

## 2022-07-30 MED ORDER — CALCIUM GLUCONATE-NACL 1-0.675 GM/50ML-% IV SOLN
1.0000 g | Freq: Once | INTRAVENOUS | Status: DC
  Filled 2022-07-30: qty 50

## 2022-07-30 NOTE — Progress Notes (Signed)
[facility] Progress Note Patient Name: Jeanette Elliott DOB: Apr 25, 1981 MRN: [id number]   Date of Service  07/30/2022  HPI/Events of Note  K remains high at 5.9, crea 1.10.  Mg 1.8, ionized calcium 1.09. HCO3 remains low <7.  eICU Interventions  Start on hyperkalemia protocol - insulin, D50, lokelma. Replete Mg and Calcium. Continue HCO3 gtt.  Repeat BMP at midnight.     Intervention Category Intermediate Interventions: Electrolyte abnormality - evaluation and management  Elsie Lincoln 07/30/2022, 8:36 PM

## 2022-07-30 NOTE — ED Provider Notes (Signed)
[facility] EMERGENCY DEPARTMENT Provider Note   CSN: [id number] Arrival date & time: 07/30/22  0930     History  Chief Complaint  Patient presents with   Back Pain   Emesis    Jerie Basford is a 41 y.o. female.  The history is provided by the patient and medical records. No language interpreter was used.  Back Pain Pain location: bilateral cva. Quality:  Shooting and aching Radiates to:  Does not radiate Pain severity:  Severe Onset quality:  Gradual Duration:  2 days Timing:  Constant Progression:  Worsening Chronicity:  Recurrent Relieved by:  Nothing Worsened by:  Nothing Ineffective treatments:  None tried Associated symptoms: no abdominal pain, no chest pain, no dysuria, no headaches, no leg pain, no numbness, no perianal numbness and no weakness  Fever: chills. Emesis Associated symptoms: chills   Associated symptoms: no abdominal pain, no diarrhea and no headaches  Fever: chills.      Home Medications Prior to Admission medications   Medication Sig Start Date End Date Taking? Authorizing Provider  albuterol (VENTOLIN HFA) 108 (90 Base) MCG/ACT inhaler Inhale 2 puffs into the lungs every 6 (six) hours as needed (asthma attacks). 01/22/21   Cirigliano, Garvin Fila, DO  Beta Carotene (VITAMIN A) 25000 UNIT capsule Take 25,000 Units by mouth daily.    [provider]  cariprazine (VRAYLAR) 1.5 MG capsule Take 1 capsule (1.5 mg total) by mouth daily. 06/22/22   [name], NP  cholecalciferol (VITAMIN D3) 25 MCG (1000 UNIT) tablet Take 1,000 Units by mouth daily.    [provider]  cyanocobalamin (,VITAMIN B-12,) 1000 MCG/ML injection Inject 1,000 mcg into the muscle every 30 (thirty) days. 12/25/21   [provider]  Dextromethorphan-buPROPion ER (AUVELITY) 45-105 MG TBCR Take 1 tablet by mouth 2 (two) times daily. 06/22/22   [name], NP  dicyclomine (BENTYL) 10 MG capsule Take 1 capsule (10 mg total) by mouth 4  (four) times daily -  before meals and at bedtime. 06/16/22   Haydee Salter, MD  EPINEPHrine 0.3 mg/0.3 mL IJ SOAJ injection Inject 0.3 mg into the muscle once as needed for anaphylaxis. 01/22/21   Cirigliano, Mary K, DO  escitalopram (LEXAPRO) 20 MG tablet Take 1 tablet (20 mg total) by mouth daily. 06/22/22   [name], NP  folic acid (FOLVITE) 1 MG tablet TAKE 1 TABLET(1 MG) BY MOUTH DAILY Patient taking differently: Take 1 mg by mouth daily. 04/09/21   Cirigliano, Garvin Fila, DO  furosemide (LASIX) 20 MG tablet Take 1 tablet (20 mg total) by mouth daily. Patient not taking: Reported on 06/16/2022 06/16/22   Cherene Altes, MD  hydrOXYzine (ATARAX) 25 MG tablet Take 1 tablet (25 mg total) by mouth 2 (two) times daily. 01/27/22   [name], NP  naltrexone (DEPADE) 50 MG tablet Take 50 mg by mouth daily. 02/15/22   [provider]  Polyethyl Glycol-Propyl Glycol (SYSTANE FREE OP) Apply 1 drop to eye daily as needed (dryness).    [provider]  potassium chloride SA (KLOR-CON) 20 MEQ tablet Take 1 tablet (20 mEq total) by mouth daily. 02/28/21 06/12/22  Dagar, Meredith Staggers, MD  pregabalin (LYRICA) 25 MG capsule TAKE ONE CAPSULE BY MOUTH TWICE A DAY 06/25/22   Haydee Salter, MD  spironolactone (ALDACTONE) 50 MG tablet Take 1 tablet (50 mg total) by mouth daily. 06/16/22   Cherene Altes, MD  traZODone (DESYREL) 50 MG tablet TAKE 1  TABLET(50 MG) BY MOUTH AT BEDTIME Patient taking differently: Take 50 mg by mouth at bedtime as needed for sleep. 11/30/21   [name], NP  ZOLMitriptan (ZOMIG) 2.5 MG tablet Take 1 tablet (2.5 mg total) by mouth once for 1 dose. May repeat in 2 hours if headache persists or recurs. 05/05/21 06/12/22  Haydee Salter, MD      Allergies    Latex, Iodine, and Keflex [cephalexin]    Review of Systems   Review of Systems  Constitutional:  Positive for chills and fatigue. Fever: chills. HENT:  Negative for congestion.   Respiratory:  Negative for  chest tightness, shortness of breath and wheezing.   Cardiovascular:  Negative for chest pain.  Gastrointestinal:  Positive for nausea and vomiting. Negative for abdominal distention, abdominal pain, constipation and diarrhea.  Genitourinary:  Positive for difficulty urinating and flank pain. Negative for dysuria.  Musculoskeletal:  Positive for back pain. Negative for neck pain and neck stiffness.  Skin:  Negative for rash and wound.  Neurological:  Negative for weakness, numbness and headaches.  Psychiatric/Behavioral:  Negative for agitation and confusion.   All other systems reviewed and are negative.   Physical Exam Updated Vital Signs BP 124/81 (BP Location: Left Arm)   Pulse (!) 131   Temp 99 F (37.2 C) (Oral)   Resp 20   Ht _0  (1.702 m)   Wt 68 kg   SpO2 95%   BMI 23.49 kg/m  Physical Exam Vitals and nursing note reviewed.  Constitutional:      General: She is not in acute distress.    Appearance: She is well-developed. She is not ill-appearing, toxic-appearing or diaphoretic.  HENT:     Head: Normocephalic and atraumatic.     Nose: No congestion or rhinorrhea.     Mouth/Throat:     Mouth: Mucous membranes are moist.     Pharynx: No oropharyngeal exudate or posterior oropharyngeal erythema.  Eyes:     Extraocular Movements: Extraocular movements intact.     Conjunctiva/sclera: Conjunctivae normal.     Pupils: Pupils are equal, round, and reactive to light.  Cardiovascular:     Rate and Rhythm: Normal rate and regular rhythm.     Heart sounds: No murmur heard. Pulmonary:     Effort: Pulmonary effort is normal. No respiratory distress.     Breath sounds: Normal breath sounds. No wheezing, rhonchi or rales.  Chest:     Chest wall: No tenderness.  Abdominal:     General: Abdomen is flat.     Palpations: Abdomen is soft.     Tenderness: There is no abdominal tenderness. There is no right CVA tenderness, left CVA tenderness, guarding or rebound.   Musculoskeletal:        General: Tenderness present. No swelling.     Cervical back: Neck supple.     Right lower leg: No edema.     Left lower leg: No edema.  Skin:    General: Skin is warm and dry.     Capillary Refill: Capillary refill takes less than 2 seconds.     Coloration: Skin is not pale.     Findings: No erythema or rash.  Neurological:     General: No focal deficit present.     Mental Status: She is alert.     Sensory: No sensory deficit.     Motor: No weakness.  Psychiatric:        Mood and Affect: Mood normal.  ED Results / Procedures / Treatments   Labs (all labs ordered are listed, but only abnormal results are displayed) Labs Reviewed  CBC WITH DIFFERENTIAL/PLATELET - Abnormal; Notable for the following components:      Result Value   WBC 13.3 (*)    RBC 3.85 (*)    MCV 114.8 (*)    MCH 37.1 (*)    Neutro Abs 9.8 (*)    Monocytes Absolute 1.6 (*)    All other components within normal limits  COMPREHENSIVE METABOLIC PANEL - Abnormal; Notable for the following components:   Potassium 5.7 (*)    CO2 <7 (*)    Glucose, Bld 102 (*)    Creatinine, Ser 1.02 (*)    AST 116 (*)    ALT 50 (*)    Alkaline Phosphatase 369 (*)    Total Bilirubin 1.6 (*)    All other components within normal limits  URINALYSIS, ROUTINE W REFLEX MICROSCOPIC - Abnormal; Notable for the following components:   Hgb urine dipstick SMALL (*)    Ketones, ur 20 (*)    Protein, ur 100 (*)    Bacteria, UA RARE (*)    All other components within normal limits  ETHANOL - Abnormal; Notable for the following components:   Alcohol, Ethyl (B) 97 (*)    All other components within normal limits  TROPONIN I (HIGH SENSITIVITY) - Abnormal; Notable for the following components:   Troponin I (High Sensitivity) 23 (*)    All other components within normal limits  URINE CULTURE  LIPASE, BLOOD  RAPID URINE DRUG SCREEN, HOSP PERFORMED  I-STAT BETA HCG BLOOD, ED ([facility], [facility], [facility] ONLY)  TROPONIN I  (HIGH SENSITIVITY)    EKG EKG Interpretation  Date/Time:  Friday July 30 2022 10:21:54 EST Ventricular Rate:  137 PR Interval:  138 QRS Duration: 80 QT Interval:  292 QTC Calculation: 440 R Axis:   67 Text Interpretation: Sinus tachycardia Low voltage QRS Cannot rule out Anterior infarct , age undetermined Abnormal ECG When compared with ECG of 12-Jun-2022 13:55, PREVIOUS ECG IS PRESENT when compared to prior, faster rate. No sTEMI Confirmed by Antony Blackbird [phone]) on 07/30/2022 12:04:49 PM  Radiology CT ABDOMEN PELVIS WO CONTRAST  Result Date: 07/30/2022 CLINICAL DATA:  Back pain, vomiting EXAM: CT ABDOMEN AND PELVIS WITHOUT CONTRAST TECHNIQUE: Multidetector CT imaging of the abdomen and pelvis was performed following the standard protocol without IV contrast. RADIATION DOSE REDUCTION: This exam was performed according to the departmental dose-optimization program which includes automated exposure control, adjustment of the mA and/or kV according to patient size and/or use of iterative reconstruction technique. COMPARISON:  June 12, 2022 FINDINGS: Evaluation is limited by lack of IV contrast. Lower chest: No acute abnormality. Hepatobiliary: Hepatic steatosis and hepatomegaly. Cholelithiasis without ancillary evidence of acute cholecystitis. Mildly nodular appearance of the liver. Pancreas: No new peripancreatic fat stranding. Spleen: Unremarkable. Adrenals/Urinary Tract: Adrenal glands are unremarkable. Similar appearance of a RIGHT-sided at inferior pole renal calcification. No hydronephrosis. Bladder is moderately distended. Stomach/Bowel: No evidence of bowel obstruction. Circumferential bowel wall thickening of the distal esophagus. Appendix is normal. Resolution ileal wall thickening. Vascular/Lymphatic: Age advanced atherosclerotic calcifications. No new suspicious lymphadenopathy. Reproductive: Uterus and bilateral adnexa are unremarkable. Other: Similar appearance of a mildly  misty mesentery, likely sequela of prior pancreatitis or other inflammatory event. No free air or free fluid. Musculoskeletal: Remote rib fractures. IMPRESSION: 1. Circumferential bowel wall thickening of the distal esophagus could reflect esophagitis in the appropriate clinical setting. 2.  Hepatic steatosis and hepatomegaly. Mildly nodular appearance of the liver could reflect underlying cirrhosis. 3. Cholelithiasis without ancillary evidence of acute cholecystitis. 4. Age advanced atherosclerotic calcifications. Electronically Signed   By: Valentino Saxon M.D.   On: 07/30/2022 14:43   DG Chest 2 View  Result Date: 07/30/2022 CLINICAL DATA:  Shortness of breath EXAM: CHEST - 2 VIEW COMPARISON:  06/12/2022 FINDINGS: The heart size and mediastinal contours are within normal limits. Both lungs are clear. The visualized skeletal structures are unremarkable. IMPRESSION: No active cardiopulmonary disease. Electronically Signed   By: Davina Poke D.O.   On: 07/30/2022 10:58    Procedures Procedures    Medications Ordered in ED Medications  ondansetron (ZOFRAN-ODT) disintegrating tablet 4 mg (4 mg Oral Given 07/30/22 1034)  fentaNYL (SUBLIMAZE) injection 50 mcg (50 mcg Intravenous Given 07/30/22 1331)  sodium chloride 0.9 % bolus 1,000 mL (0 mLs Intravenous Stopped 07/30/22 1455)  ondansetron (ZOFRAN) injection 4 mg (4 mg Intravenous Given 07/30/22 1325)    ED Course/ Medical Decision Making/ A&P                           Medical Decision Making Amount and/or Complexity of Data Reviewed Labs: ordered. Radiology: ordered.  Risk Prescription drug management.    Daila Elbert is a 41 y.o. female with a past medical history significant for Sjogren's syndrome, asthma, previous elevated liver enzymes, previous pyelonephritis, cholelithiasis, and depression who presents with subjective fevers, chills, nausea, vomiting, decreased urination, and severe pain across her low back.  This been  going on for the last day or 2.  She reports that she is unsure if this feels like when she had pyelonephritis in the past but has severe pain on the paraspinal sides of her back bilaterally.  She is denying any midline pain.  She reports some chills but no documented fevers at home.  She denies any constipation or diarrhea.  She is denying any anterior abdominal pain but the pain does go up to both flanks.  She denies any chest pain or shortness of breath at this time.  She has had some mild cough.  She is reporting generalized soreness all over but for me was not reporting specific headache or neck pain.  Of note, patient has been here for about 3 hours prior to my initial evaluation the emergency department.  Patient's heart rate is found to be in the 130s and a sinus rhythm.  She was also tachypneic and near febrile.  She was warm to the touch.  We will get rectal temperature to make sure she is not febrile.  Clinically I am somewhat concerned about pyelonephritis given her history of the same as well as the urinary changes and severe flank tenderness and pain with these vital signs.  Patient does have a leukocytosis of 13.3 but no anemia.  Metabolic panel shows kidney function is similar at 1.03 but does have persistently elevated liver enzymes alk phos and bilirubin.  This is similar to what has been in the past.  She is not pregnant.  Initial troponin is negative, will trend.  Patient will try to urinate for [place] as I am concerned about the urinary infection.  In and out catheter was ordered in case she cannot do so.  Due to the concern for pyelonephritis or even renal abscesses, will obtain CT on pelvis with contrast to further evaluate.  She will get fluids pain medicine and nausea medicine.  Anticipate  she will need IV antibiotics and admission when work-up is completed.  As she did not have any midline tenderness and has no lower extremity neurologic deficits have low suspicion for epidural  abscess and do not feel she needs MRI initially.  Anticipate reassessment after work-up to determine disposition but anticipate admission.   Radiology reports that patient does indeed have a contrast allergy from her recent CT.  We will change to a noncontrasted CT to rule out abscess or other acute abnormality causing her symptoms.  Care transferred to oncoming team to await for results of urinalysis and CT scan.  Due to the high concern for pyelonephritis with abnormal vital signs, I do anticipate she will need admission to the hospital for rehydration and symptomatic management once work-up is completed.  Care transferred in stable condition.        Final Clinical Impression(s) / ED Diagnoses Final diagnoses:  Flank pain  Tachycardia    Clinical Impression: 1. Recurrent UTI   2. Flank pain   3. Tachycardia     Disposition: Care transferred to oncoming team to await work-up results.  Anticipate admission in the setting of her vital signs.  This note was prepared with assistance of Systems analyst. Occasional wrong-word or sound-a-like substitutions may have occurred due to the inherent limitations of voice recognition software.      Delmo Matty, Gwenyth Allegra, MD 07/30/22 [phone]

## 2022-07-30 NOTE — Progress Notes (Signed)
Received pt from ED.   07/30/22 1855  Vitals  Temp 98.2 F (36.8 C)  Temp Source Oral  BP 112/80  MAP (mmHg) 92  BP Location Left Arm  BP Method Automatic  Patient Position (if appropriate) Lying  Pulse Rate (!) 124  ECG Heart Rate (!) 122  Resp (!) 35  Oxygen Therapy  SpO2 100 %  O2 Device Room Air  Height and Weight  Height 5' 7"  (1.702 m)  Weight 73.4 kg  Type of Scale Used Bed  Type of Weight Actual  BSA (Calculated - sq m) 1.86 sq meters  BMI (Calculated) 25.34  Weight in (lb) to have BMI = 25 159.3  MEWS Score  MEWS Temp 0  MEWS Systolic 0  MEWS Pulse 2  MEWS RR 2  MEWS LOC 0  MEWS Score 4  MEWS Score Color Red

## 2022-07-30 NOTE — ED Provider Triage Note (Signed)
Emergency Medicine Provider Triage Evaluation Note  Jeanette Elliott , a 41 y.o. female  was evaluated in triage.  Pt complains of worsening bilateral lower back pain over the last week in the "kidneys" with new onset N/V since 3 AM this morning.  Described as brownish in color.  Also with some headache and sinus pressure, with shortness of breath.  Feels dehydrated, has not been eating or drinking much.  Also with decreased urinary and stool output.  Denies alcohol or recreational drug use in at least the last 7 months.  Takes gabapentin for neuropathy.  Has not taken anything to help with pain or nausea.  Hx of severe alcohol use, depression, anxiety, duodenal ulcer, cirrhosis, pyelonephritis.  Review of Systems  Positive:  Negative: See above  Physical Exam  BP 124/81 (BP Location: Left Arm)   Pulse (!) 131   Temp 99 F (37.2 C) (Oral)   Resp 20   Ht 5' 7"  (1.702 m)   Wt 68 kg   SpO2 95%   BMI 23.49 kg/m  Gen:   Awake, AAOx4, actively vomiting into bag, disheveled, mildly diaphoretic, ill-appearing Resp:  Normal effort, equal chest rise, tachypneic, CTAB MSK:   Moves extremities without difficulty  Other:  Abdomen soft, generally non-TTP.  Bilateral lower back tenderness without midline tenderness.  Gaze aligned appropriately.  Sitting in wheelchair.  Appears clinically dehydrated.  Medical Decision Making  Medically screening exam initiated at 10:13 AM.  Appropriate orders placed.  Jeanette Elliott was informed that the remainder of the evaluation will be completed by another provider, this initial triage assessment does not replace that evaluation, and the importance of remaining in the ED until their evaluation is complete.  Tachycardic with 130-140bpm.  Tachypneic.  Overall unwell appearing.  Discussed with attending and triage nurse, needs room in back as soon as possible   Prince Rome, PA-C 13/14/38 1025

## 2022-07-30 NOTE — ED Triage Notes (Signed)
Pt arrived POV from home c/o lower back pain, difficulty urinating, N/V since 3am.

## 2022-07-30 NOTE — H&P (Addendum)
NAME:  Jeanette Elliott, MRN:  [id number], DOB:  01-25-1981, LOS: 0 ADMISSION DATE:  07/30/2022, CONSULTATION DATE:  07/30/22 REFERRING MD:  Maylon Peppers - EM, CHIEF COMPLAINT:  Acidosis    History of Present Illness:  41 yo F PMH Sjogrens MDD (on vraylar, auveley, lexapro, ? Venlafaxine), moderate axonal sensorimotor polyneuropathy, hx etoh abuse (reported last drink 28moago) who presented to ED 11/17 with back painx 3 days and abdominal painx 2 days. Associated decreased PO intake the past 2 days. 1 day prior to admission was only able to eat 1 cup of applesauce and fluids. Day of presentation, fluids only. Reports following vegetarian diet at baseline, states taht intake is normally "normal" but does not elaborate when asked further. Has some baseline pain in her extremities attributed to neuropathy for which she is on gabapentin no acute change in sx, but in chart review it looks like there is significant paresthesias as well. No sz. No LOC. No recent med changes  In ED, CT a/p  w hepatic steatosis, nodularity of liver, no hydronephrosis, maybe some esophagitis, " misty" mesentery which may be sequelae of prior process such as pancreatitis.   Labs w severe metabolic acidosis -- pH 6.8, LA >9. Elevated LFTs. Ethyl alcohol 97. UDS neg. Ua with 20 ketones. No glucosuria. Not hyperglycemic    PCCM consulted for admission in this setting   Pertinent  Medical History  MD[facility] E[facility]h abuse  [facility], antibiotic start and stop dates in addition to other pertinent events   11/17 to ED with back anda bd pain. No acute explanation on imaging. Severe acidosis on labs. Admit to ICU   Interim History / Subjective:   Mother at bedside On IVf  Objective   Blood pressure 127/81, pulse (!) 127, temperature (!) 96.8 F (36 C), temperature source Rectal, resp. rate 18, height 5' 7"  (1.702 m), weight 68 kg, SpO2 99 %.       No intake or output data in the 24 hours  ending 07/30/22 1745 Filed Weights   07/30/22 0958  Weight: 68 kg    Examination: General: ill appearing middle aged F in mild distress  HENT: [place]. Abnormally pink/red lips and tongue, glossitis. Angular cheilitis. No aphthous ulcers. Lungs: CTAb  Cardiovascular: tachycardic, regular Abdomen: soft nd. Generalized tenderness  Extremities: no acute joint deformity Skin: scattered scratch marks, as well as some pin prick red dots scattered over extremities (BUE > BLE) and dermatitis.  Neuro: AAOx3 following commands GU: defer  Resolved Hospital Problem list     Assessment & Plan:   Severe metabolic acidosis  Lactic acidosis Hx EtOH abuse  Elevated LFTs  Hyperkalemia  Hypocalcemia Leukocytosis, mild  Glossitis + angular cheilitis + dermatitis  Hx Sjogrens  Hx depression Abdominal pain -ethyl alcohol 90s on presentation. Denies etoh intake of any sort.  - Think its likely this is nutrient deficiency related. Very Poor intake in recent hx but wonder if there is a more chronic underlying nutrient deficiency vs Starvation ketosis. Follows a vegetarian diet.   -? Hx pancreatitis w Ct findings and abd pain -afebrile, UA not c/w UTI, CXR no PNA , CT a/p without convincing infectious suggestion   P -will admit to ICU -check B 1, B6, B9, B12, iron studies, Vit D, mag, phos , urine heavy metals, urine and serum osm , volatiles , cortisol, CK   -start high dose thiamin, multivitamin, folic acid + vit C. Replace calcium  -start bicarb -RDN consult --  ok to send add'l nutr labs -will send a PCT, but monitor off abx for now    -will send ANA ANCA sed and crp -holding home antidepressants for now - -want to verify no recent changes that might contribute to acidosis   Best Practice (right click and "Reselect all SmartList Selections" daily)   Diet/type: clear liquids DVT prophylaxis: SCD GI prophylaxis: N/A Lines: N/A Foley:  N/A Code Status:  full code Last date of  multidisciplinary goals of care discussion [--]  Labs   CBC: Recent Labs  Lab 07/30/22 1035 07/30/22 1632  WBC 13.3*  --   NEUTROABS 9.8*  --   HGB 14.3 15.0  HCT 44.2 44.0  MCV 114.8*  --   PLT 278  --     Basic Metabolic Panel: Recent Labs  Lab 07/30/22 1035 07/30/22 1632  NA 137 131*  K 5.7* 5.7*  CL 99  --   CO2 <7*  --   GLUCOSE 102*  --   BUN 12  --   CREATININE 1.02*  --   CALCIUM 9.6  --    GFR: Estimated Creatinine Clearance: 71.3 mL/min (A) (by C-G formula based on SCr of 1.02 mg/dL (H)). Recent Labs  Lab 07/30/22 1035 07/30/22 1619  WBC 13.3*  --   LATICACIDVEN  --  >9.0*    Liver Function Tests: Recent Labs  Lab 07/30/22 1035  AST 116*  ALT 50*  ALKPHOS 369*  BILITOT 1.6*  PROT 8.1  ALBUMIN 4.5   Recent Labs  Lab 07/30/22 1035  LIPASE 29   No results for input(s): "AMMONIA" in the last 168 hours.  ABG    Component Value Date/Time   HCO3 3.6 (L) 07/30/2022 1632   TCO2 <5 (L) 07/30/2022 1632   ACIDBASEDEF 30.0 (H) 07/30/2022 1632   O2SAT 91 07/30/2022 1632     Coagulation Profile: No results for input(s): "INR", "PROTIME" in the last 168 hours.  Cardiac Enzymes: No results for input(s): "CKTOTAL", "CKMB", "CKMBINDEX", "TROPONINI" in the last 168 hours.  HbA1C: Hgb A1c MFr Bld  Date/Time Value Ref Range Status  12/19/2021 06:30 AM 4.8 4.8 - 5.6 % Final    Comment:    (NOTE) Pre diabetes:          5.7%-6.4%  Diabetes:              >6.4%  Glycemic control for   <7.0% adults with diabetes   12/18/2021 06:46 AM 4.8 4.8 - 5.6 % Final    Comment:    (NOTE) Pre diabetes:          5.7%-6.4%  Diabetes:              >6.4%  Glycemic control for   <7.0% adults with diabetes     CBG: No results for input(s): "GLUCAP" in the last 168 hours.  Review of Systems:   Review of Systems  Constitutional:  Positive for malaise/fatigue.  HENT: Negative.    Eyes: Negative.   Respiratory:  Positive for cough and shortness of  breath.   Cardiovascular: Negative.   Gastrointestinal:  Positive for abdominal pain, nausea and vomiting.  Genitourinary:  Positive for dysuria.  Musculoskeletal: Negative.   Skin:  Positive for rash. Negative for itching.     Past Medical History:  She,  has a past medical history of Asthma, Elevated liver enzymes, Jaundice, and Sjogren's syndrome ([place]).   Surgical History:   Past Surgical History:  Procedure Laterality Date   IR PARACENTESIS  02/03/2021   IR THORACENTESIS ASP PLEURAL SPACE W/IMG GUIDE  01/16/2021   ORIF FINGER / THUMB FRACTURE Left    Thumb   SUBMANDIBULAR GLAND EXCISION     TONSILLECTOMY AND ADENOIDECTOMY       Social History:   reports that she has never smoked. She has never used smokeless tobacco. She reports that she does not currently use alcohol after a past usage of about 7.0 standard drinks of alcohol per week. She reports that she does not use drugs.   Family History:  Her family history includes Alzheimer's disease in her maternal grandfather; Arthritis in her father and mother; Cancer in her father; Depression in her maternal grandmother; Heart disease in her father, maternal grandmother, paternal grandfather, and paternal uncle; Stroke in her maternal grandmother. There is no history of Colon cancer or Esophageal cancer.   Allergies Allergies  Allergen Reactions   Iodinated Contrast Media Itching   Latex Itching and Rash   Keflex [Cephalexin] Diarrhea and Nausea And Vomiting     Home Medications  Prior to Admission medications   Medication Sig Start Date End Date Taking? Authorizing Provider  albuterol (VENTOLIN HFA) 108 (90 Base) MCG/ACT inhaler Inhale 2 puffs into the lungs every 6 (six) hours as needed (asthma attacks). 01/22/21  Yes Cirigliano, Mary K, DO  Beta Carotene (VITAMIN A) 25000 UNIT capsule Take 25,000 Units by mouth daily.   Yes [provider]  cariprazine (VRAYLAR) 1.5 MG capsule Take 1 capsule (1.5 mg total) by  mouth daily. 06/22/22  Yes White, Aaron Edelman A, NP  cholecalciferol (VITAMIN D3) 25 MCG (1000 UNIT) tablet Take 1,000 Units by mouth daily.   Yes [provider]  cyanocobalamin (,VITAMIN B-12,) 1000 MCG/ML injection Inject 1,000 mcg into the muscle every 30 (thirty) days. 12/25/21  Yes [provider]  Dextromethorphan-buPROPion ER (AUVELITY) 45-105 MG TBCR Take 1 tablet by mouth 2 (two) times daily. 06/22/22  Yes White, Aaron Edelman A, NP  EPINEPHrine 0.3 mg/0.3 mL IJ SOAJ injection Inject 0.3 mg into the muscle once as needed for anaphylaxis. 01/22/21  Yes Cirigliano, Mary K, DO  escitalopram (LEXAPRO) 20 MG tablet Take 1 tablet (20 mg total) by mouth daily. 06/22/22  Yes White, Louanna Raw, NP  folic acid (FOLVITE) 1 MG tablet TAKE 1 TABLET(1 MG) BY MOUTH DAILY Patient taking differently: Take 1 mg by mouth daily. 04/09/21  Yes Cirigliano, Mary K, DO  furosemide (LASIX) 20 MG tablet Take 1 tablet (20 mg total) by mouth daily. 06/16/22  Yes Cherene Altes, MD  gabapentin (NEURONTIN) 600 MG tablet Take 600 mg by mouth 3 (three) times daily. 06/25/22  Yes [provider]  hydrOXYzine (ATARAX) 25 MG tablet Take 1 tablet (25 mg total) by mouth 2 (two) times daily. 01/27/22  Yes White, Aaron Edelman A, NP  ibuprofen (ADVIL) 200 MG tablet Take 200 mg by mouth every 6 (six) hours as needed for mild pain or moderate pain.   Yes [provider]  naltrexone (DEPADE) 50 MG tablet Take 50 mg by mouth daily. 02/15/22  Yes [provider]  Polyethyl Glycol-Propyl Glycol (SYSTANE FREE OP) Apply 1 drop to eye daily as needed (dryness).   Yes [provider]  potassium chloride SA (KLOR-CON) 20 MEQ tablet Take 1 tablet (20 mEq total) by mouth daily. 02/28/21 07/30/22 Yes Dagar, Meredith Staggers, MD  pregabalin (LYRICA) 25 MG capsule TAKE ONE CAPSULE BY MOUTH TWICE A DAY 06/25/22  Yes Haydee Salter, MD  spironolactone (ALDACTONE) 50 MG tablet  Take 1 tablet (50 mg total) by mouth daily. 06/16/22   Yes Cherene Altes, MD  traZODone (DESYREL) 50 MG tablet TAKE 1 TABLET(50 MG) BY MOUTH AT BEDTIME Patient taking differently: Take 50 mg by mouth at bedtime as needed for sleep. 11/30/21  Yes White, Aaron Edelman A, NP  ZOLMitriptan (ZOMIG) 2.5 MG tablet Take 1 tablet (2.5 mg total) by mouth once for 1 dose. May repeat in 2 hours if headache persists or recurs. 05/05/21 07/30/22 Yes RuddLillette Boxer, MD  dicyclomine (BENTYL) 10 MG capsule Take 1 capsule (10 mg total) by mouth 4 (four) times daily -  before meals and at bedtime. Patient not taking: Reported on 07/30/2022 06/16/22   Haydee Salter, MD     Critical care time: 62 minutes      CRITICAL CARE Performed by: Cristal Generous   Total critical care time: 62 minutes  Critical care time was exclusive of separately billable procedures and treating other patients.  Critical care was necessary to treat or prevent imminent or life-threatening deterioration.  Critical care was time spent personally by me on the following activities: development of treatment plan with patient and/or surrogate as well as nursing, discussions with consultants, evaluation of patient's response to treatment, examination of patient, obtaining history from patient or surrogate, ordering and performing treatments and interventions, ordering and review of laboratory studies, ordering and review of radiographic studies, pulse oximetry and re-evaluation of patient's condition.  Eliseo Gum MSN, AGACNP-BC [place] for pager  07/30/2022, 6:21 PM

## 2022-07-30 NOTE — Progress Notes (Signed)
eLink Physician-Brief Progress Note Patient Name: Jeanette Elliott DOB: 14-May-1981 MRN: [id number]   Date of Service  07/30/2022  HPI/Events of Note  41/F with asthma, Sjogren's syndrome, depression, alcoholism, presents with fevers, chills, nausea and vomiting and lower back pain.  Pt with prior hx of pyelonephritis.  Work up in the ED revealed severe high anion gap metabolic acidosis with lactate >9, hyperkalemia with K 5.7. EtOH   BP 109/74, HR 118, RR 20, O2 sats 100%.  Pt is awake and alert, not in respiratory distress.   CT abdomen Circumferential bowel wall thickening of the distal esophagus could reflect esophagitis in the appropriate clinical setting. 2. Hepatic steatosis and hepatomegaly. Mildly nodular appearance of the liver could reflect underlying cirrhosis. 3. Cholelithiasis without ancillary evidence of acute cholecystitis. 4. Age advanced atherosclerotic calcifications.  eICU Interventions  Continue with thiamine, folate and multivitamins.  Continue HCO3 gtt.  Monitor for signs of withdrawal.  Start on protonix.  Check osmolar gap.  Tramadol ordered PRN for back pain.  Heparin for DVT Prophylaxis.  Awaiting new set of labs before addressing hyperkalemia.      Intervention Category Evaluation Type: New Patient Evaluation  Elsie Lincoln 07/30/2022, 7:10 PM

## 2022-07-30 NOTE — ED Provider Notes (Addendum)
Patient initially evaluated by Dr. Sherry Ruffing and signed out to me at 1500 pending urine, CTAP and reassessment with likely plan for admission.  In short this is a 41 year old female with a past medical history of Sjogren's, asthma, prior elevated LFTs that presented to the emergency department with few days of low back pain and 1 day of nausea and vomiting with subjective fevers.  She has been tachycardic to the 130s but has been afebrile here.  Her work-up showed evidence of a metabolic acidosis with a bicarb that is undetectable and high anion gap.  She does have a positive alcohol level.  Urine does have ketones without signs of infection.  CT AP was negative for pyelonephritis but did show signs of esophagitis.  She will additionally have VBG, BHB and CK performed to further evaluate for starvation or alcoholic ketoacidosis as well as a lactate to evaluate for lactic acidosis.  The patient will be started on D5 normal saline bolus for concern for starvation or alcohol ketoacidosis and maintenance fluids.  CK was performed to evaluate for possible rhabdo as cause of her back pain.  She will require admission for rehydration pending the remainder of her labs.   Kemper Durie, DO 07/30/22 1536  CRITICAL CARE Performed by: Kemper Durie   Total critical care time: 40 minutes  Critical care time was exclusive of separately billable procedures and treating other patients.  Critical care was necessary to treat or prevent imminent or life-threatening deterioration.  Critical care was time spent personally by me on the following activities: development of treatment plan with patient and/or surrogate as well as nursing, discussions with consultants, evaluation of patient's response to treatment, examination of patient, obtaining history from patient or surrogate, ordering and performing treatments and interventions, ordering and review of laboratory studies, ordering and review of radiographic  studies, pulse oximetry and re-evaluation of patient's condition.    Kemper Durie, DO 07/30/22 1655

## 2022-07-30 NOTE — ED Notes (Signed)
ED TO INPATIENT HANDOFF REPORT  ED Nurse Name and Phone #: Brien Mates [phone]  S Name/Age/Gender Jeanette Elliott 41 y.o. female Room/Bed: 026C/026C  Code Status   Code Status: Full Code  Home/SNF/Other Home Patient oriented to: self, place, time, and situation Is this baseline? Yes   Triage Complete: Triage complete  Chief Complaint Metabolic acidosis [N86.76]  Triage Note Pt arrived POV from home c/o lower back pain, difficulty urinating, N/V since 3am.    Allergies Allergies  Allergen Reactions   Iodinated Contrast Media Itching   Latex Itching and Rash   Keflex [Cephalexin] Diarrhea and Nausea And Vomiting    Level of Care/Admitting Diagnosis ED Disposition     ED Disposition  Admit   Condition  --   [facility]: [place] [[id number]]  Level of Care: ICU [6]  May admit patient to [facility] or [facility] if equivalent level of care is available:: Yes  Covid Evaluation: Asymptomatic - no recent exposure (last 10 days) testing not required  Diagnosis: Metabolic acidosis [720947]  Admitting Physician: Garner Nash [[id number]]  Attending Physician: Garner Nash [[id number]]  Certification:: I certify this patient will need inpatient services for at least 2 midnights  Estimated Length of Stay: 5          B Medical/Surgery History Past Medical History:  Diagnosis Date   Asthma    Elevated liver enzymes    Jaundice    Sjogren's syndrome ([place])    Past Surgical History:  Procedure Laterality Date   IR PARACENTESIS  02/03/2021   IR THORACENTESIS ASP PLEURAL SPACE W/IMG GUIDE  01/16/2021   ORIF FINGER / THUMB FRACTURE Left    Thumb   SUBMANDIBULAR GLAND EXCISION     TONSILLECTOMY AND ADENOIDECTOMY       A IV Location/Drains/Wounds Patient Lines/Drains/Airways Status     Active Line/Drains/Airways     Name Placement date Placement time Site Days   Peripheral IV 07/30/22 20 G Anterior;Proximal;Right Forearm  07/30/22  1324  Forearm  less than 1   Wound / Incision (Open or Dehisced) 01/14/21 Puncture Abdomen Right 01/14/21  0909  Abdomen  562   Wound / Incision (Open or Dehisced) 02/03/21 Other (Comment) Abdomen Left;Upper Puncture site from paracentesis 02/03/21  1352  Abdomen  542            Intake/Output Last 24 hours No intake or output data in the 24 hours ending 07/30/22 1819  Labs/Imaging Results for orders placed or performed during the hospital encounter of 07/30/22 (from the past 48 hour(s))  CBC with Differential     Status: Abnormal   Collection Time: 07/30/22 10:35 AM  Result Value Ref Range   WBC 13.3 (H) 4.0 - 10.5 K/uL   RBC 3.85 (L) 3.87 - 5.11 MIL/uL   Hemoglobin 14.3 12.0 - 15.0 g/dL   HCT 44.2 36.0 - 46.0 %   MCV 114.8 (H) 80.0 - 100.0 fL   MCH 37.1 (H) 26.0 - 34.0 pg   MCHC 32.4 30.0 - 36.0 g/dL   RDW 13.8 11.5 - 15.5 %   Platelets 278 150 - 400 K/uL   nRBC 0.2 0.0 - 0.2 %   Neutrophils Relative % 74 %   Neutro Abs 9.8 (H) 1.7 - 7.7 K/uL   Lymphocytes Relative 14 %   Lymphs Abs 1.9 0.7 - 4.0 K/uL   Monocytes Relative 12 %   Monocytes Absolute 1.6 (H) 0.1 - 1.0 K/uL   Eosinophils  Relative 0 %   Eosinophils Absolute 0.0 0.0 - 0.5 K/uL   Basophils Relative 0 %   Basophils Absolute 0.0 0.0 - 0.1 K/uL   nRBC 0 0 /100 WBC   Abs Immature Granulocytes 0.00 0.00 - 0.07 K/uL    Comment: Performed at [facility], [place] [address], [place], [place] [address]  Comprehensive metabolic panel     Status: Abnormal   Collection Time: 07/30/22 10:35 AM  Result Value Ref Range   Sodium 137 135 - 145 mmol/L   Potassium 5.7 (H) 3.5 - 5.1 mmol/L    Comment: HEMOLYSIS AT THIS LEVEL MAY AFFECT RESULT   Chloride 99 98 - 111 mmol/L   CO2 <7 (L) 22 - 32 mmol/L   Glucose, Bld 102 (H) 70 - 99 mg/dL    Comment: Glucose reference range applies only to samples taken after fasting for at least 8 hours.   BUN 12 6 - 20 mg/dL   Creatinine, Ser 1.02 (H) 0.44 - 1.00 mg/dL    Calcium 9.6 8.9 - 10.3 mg/dL   Total Protein 8.1 6.5 - 8.1 g/dL   Albumin 4.5 3.5 - 5.0 g/dL   AST 116 (H) 15 - 41 U/L    Comment: HEMOLYSIS AT THIS LEVEL MAY AFFECT RESULT   ALT 50 (H) 0 - 44 U/L    Comment: HEMOLYSIS AT THIS LEVEL MAY AFFECT RESULT   Alkaline Phosphatase 369 (H) 38 - 126 U/L   Total Bilirubin 1.6 (H) 0.3 - 1.2 mg/dL    Comment: HEMOLYSIS AT THIS LEVEL MAY AFFECT RESULT   GFR, Estimated >60 >60 mL/min    Comment: (NOTE) Calculated using the CKD-EPI Creatinine Equation (2021)    Anion gap NOT CALCULATED 5 - 15    Comment: Performed at [facility], [place] [address] [place], [place] [address]  Lipase, blood     Status: None   Collection Time: 07/30/22 10:35 AM  Result Value Ref Range   Lipase 29 11 - 51 U/L    Comment: Performed at [place] [address], [place], [place] [address]  Troponin I (High Sensitivity)     Status: None   Collection Time: 07/30/22 10:35 AM  Result Value Ref Range   Troponin I (High Sensitivity) 17 <18 ng/L    Comment: (NOTE) Elevated high sensitivity troponin I (hsTnI) values and significant  changes across serial measurements may suggest ACS but many other  chronic and acute conditions are known to elevate hsTnI results.  Refer to the "Links" section for chest pain algorithms and additional  guidance. Performed at [facility], [place] [address], [place], [place] [address]   Ethanol     Status: Abnormal   Collection Time: 07/30/22 10:35 AM  Result Value Ref Range   Alcohol, Ethyl (B) 97 (H) <10 mg/dL    Comment: (NOTE) Lowest detectable limit for serum alcohol is 10 mg/dL.  For medical purposes only. Performed at [facility], [place] [address] [place],  [address]   I-Stat beta hCG blood, ED     Status: None   Collection Time: 07/30/22 12:11 PM  Result Value Ref Range   I-stat hCG, quantitative <5.0 <5 mIU/mL   Comment 3            Comment:   GEST. AGE      CONC.  (mIU/mL)   <=1 WEEK        5  - 50  2 WEEKS       50 - 500     3 WEEKS       100 - 10,000     4 WEEKS     1,000 - 30,000        FEMALE AND NON-PREGNANT FEMALE:     LESS THAN 5 mIU/mL   Troponin I (High Sensitivity)     Status: Abnormal   Collection Time: 07/30/22 12:23 PM  Result Value Ref Range   Troponin I (High Sensitivity) 23 (H) <18 ng/L    Comment: (NOTE) Elevated high sensitivity troponin I (hsTnI) values and significant  changes across serial measurements may suggest ACS but many other  chronic and acute conditions are known to elevate hsTnI results.  Refer to the "Links" section for chest pain algorithms and additional  guidance. Performed at [facility], [place] [address], [place], [place] [address]   Urinalysis, Routine w reflex microscopic Urine, Catheterized     Status: Abnormal   Collection Time: 07/30/22  2:30 PM  Result Value Ref Range   Color, Urine YELLOW YELLOW   APPearance CLEAR CLEAR   Specific Gravity, Urine 1.013 1.005 - 1.030   pH 5.0 5.0 - 8.0   Glucose, UA NEGATIVE NEGATIVE mg/dL   Hgb urine dipstick SMALL (A) NEGATIVE   Bilirubin Urine NEGATIVE NEGATIVE   Ketones, ur 20 (A) NEGATIVE mg/dL   Protein, ur 100 (A) NEGATIVE mg/dL   Nitrite NEGATIVE NEGATIVE   Leukocytes,Ua NEGATIVE NEGATIVE   RBC / HPF 0-5 0 - 5 RBC/hpf   WBC, UA 0-5 0 - 5 WBC/hpf   Bacteria, UA RARE (A) NONE SEEN   Squamous Epithelial / LPF 0-5 0 - 5   Mucus PRESENT     Comment: Performed at [facility], [place] [address] [place], [place] [address]  Urine rapid drug screen (hosp performed)     Status: None   Collection Time: 07/30/22  2:30 PM  Result Value Ref Range   Opiates NONE DETECTED NONE DETECTED   Cocaine NONE DETECTED NONE DETECTED   Benzodiazepines NONE DETECTED NONE DETECTED   Amphetamines NONE DETECTED NONE DETECTED   Tetrahydrocannabinol NONE DETECTED NONE DETECTED   Barbiturates NONE DETECTED NONE DETECTED    Comment: (NOTE) DRUG SCREEN FOR MEDICAL PURPOSES ONLY.  IF CONFIRMATION  IS NEEDED FOR ANY PURPOSE, NOTIFY LAB WITHIN 5 DAYS.  LOWEST DETECTABLE LIMITS FOR URINE DRUG SCREEN Drug Class                     Cutoff (ng/mL) Amphetamine and metabolites    1000 Barbiturate and metabolites    200 Benzodiazepine                 200 Opiates and metabolites        300 Cocaine and metabolites        300 THC                            50 Performed at [facility], [place] [address], [place], [place] [address]   Beta-hydroxybutyric acid     Status: Abnormal   Collection Time: 07/30/22  3:25 PM  Result Value Ref Range   Beta-Hydroxybutyric Acid 4.78 (H) 0.05 - 0.27 mmol/L    Comment: RESULT CONFIRMED BY MANUAL DILUTION Performed at [facility] [address], [place], [place] [address]   Lactic acid, plasma  Status: Abnormal   Collection Time: 07/30/22  4:19 PM  Result Value Ref Range   Lactic Acid, Venous >9.0 (HH) 0.5 - 1.9 mmol/L    Comment: CRITICAL RESULT CALLED TO, READ BACK BY AND VERIFIED WITH C,DUGARTE RN @1700  07/30/22 E,BENTON Performed at [facility], [address] [place],  [address]   I-Stat venous blood gas, [facility] ED only)     Status: Abnormal   Collection Time: 07/30/22  4:32 PM  Result Value Ref Range   pH, Ven 6.819 (LL) 7.25 - 7.43   pCO2, Ven 22.0 (L) 44 - 60 mmHg   pO2, Ven 107 (H) 32 - 45 mmHg   Bicarbonate 3.6 (L) 20.0 - 28.0 mmol/L   TCO2 <5 (L) 22 - 32 mmol/L   O2 Saturation 91 %   Acid-base deficit 30.0 (H) 0.0 - 2.0 mmol/L   Sodium 131 (L) 135 - 145 mmol/L   Potassium 5.7 (H) 3.5 - 5.1 mmol/L   Calcium, Ion 1.09 (L) 1.15 - 1.40 mmol/L   HCT 44.0 36.0 - 46.0 %   Hemoglobin 15.0 12.0 - 15.0 g/dL   Sample type VENOUS    CT ABDOMEN PELVIS WO CONTRAST  Result Date: 07/30/2022 CLINICAL DATA:  Back pain, vomiting EXAM: CT ABDOMEN AND PELVIS WITHOUT CONTRAST TECHNIQUE: Multidetector CT imaging of the abdomen and pelvis was performed following the standard protocol without IV contrast. RADIATION DOSE  REDUCTION: This exam was performed according to the departmental dose-optimization program which includes automated exposure control, adjustment of the mA and/or kV according to patient size and/or use of iterative reconstruction technique. COMPARISON:  June 12, 2022 FINDINGS: Evaluation is limited by lack of IV contrast. Lower chest: No acute abnormality. Hepatobiliary: Hepatic steatosis and hepatomegaly. Cholelithiasis without ancillary evidence of acute cholecystitis. Mildly nodular appearance of the liver. Pancreas: No new peripancreatic fat stranding. Spleen: Unremarkable. Adrenals/Urinary Tract: Adrenal glands are unremarkable. Similar appearance of a RIGHT-sided at inferior pole renal calcification. No hydronephrosis. Bladder is moderately distended. Stomach/Bowel: No evidence of bowel obstruction. Circumferential bowel wall thickening of the distal esophagus. Appendix is normal. Resolution ileal wall thickening. Vascular/Lymphatic: Age advanced atherosclerotic calcifications. No new suspicious lymphadenopathy. Reproductive: Uterus and bilateral adnexa are unremarkable. Other: Similar appearance of a mildly misty mesentery, likely sequela of prior pancreatitis or other inflammatory event. No free air or free fluid. Musculoskeletal: Remote rib fractures. IMPRESSION: 1. Circumferential bowel wall thickening of the distal esophagus could reflect esophagitis in the appropriate clinical setting. 2. Hepatic steatosis and hepatomegaly. Mildly nodular appearance of the liver could reflect underlying cirrhosis. 3. Cholelithiasis without ancillary evidence of acute cholecystitis. 4. Age advanced atherosclerotic calcifications. Electronically Signed   By: Valentino Saxon M.D.   On: 07/30/2022 14:43   DG Chest 2 View  Result Date: 07/30/2022 CLINICAL DATA:  Shortness of breath EXAM: CHEST - 2 VIEW COMPARISON:  06/12/2022 FINDINGS: The heart size and mediastinal contours are within normal limits. Both lungs  are clear. The visualized skeletal structures are unremarkable. IMPRESSION: No active cardiopulmonary disease. Electronically Signed   By: Davina Poke D.O.   On: 07/30/2022 10:58    Pending Labs Unresulted Labs (From admission, onward)     Start     Ordered   07/31/22 0500  CBC  Tomorrow morning,   R        07/30/22 1746   07/31/22 7893  Basic metabolic panel  Tomorrow morning,   R        07/30/22 1746   07/30/22 1804  Volatiles,Blood (acetone,ethanol,isoprop,methanol)  Once,   R        07/30/22 1803   94/49/67 5916  Salicylate level  Once,   R        07/30/22 1803   07/30/22 1751  Rheumatoid factor  Once,   R        07/30/22 1750   07/30/22 1750  ANA w/Reflex if Positive  Once,   R        07/30/22 1750   07/30/22 1750  ANCA Profile  Once,   R        07/30/22 1750   07/30/22 1750  C-reactive protein  Once,   R        07/30/22 1750   07/30/22 1750  Sedimentation rate  Once,   R        07/30/22 1750   07/30/22 1749  Iron and TIBC  Once,   R        07/30/22 1748   07/30/22 1748  Folate  Once,   R        07/30/22 1748   07/30/22 1747  Lipase, blood  Once,   R        07/30/22 1746   07/30/22 1747  Procalcitonin  Once,   R        07/30/22 1746   07/30/22 1747  Cortisol  ONCE - URGENT,   URGENT        07/30/22 1746   07/30/22 1747  Magnesium  Once,   R        07/30/22 1746   07/30/22 1747  Phosphorus  Once,   R        07/30/22 1746   07/30/22 1747  Urinalysis, Routine w reflex microscopic  Once,   R        07/30/22 1746   07/30/22 1746  CBC  (heparin)  Once,   R       Comments: Baseline for heparin therapy IF NOT ALREADY DRAWN.  Notify MD if PLT < 100 K.    07/30/22 1746   07/30/22 1746  Creatinine, serum  (heparin)  Once,   R       Comments: Baseline for heparin therapy IF NOT ALREADY DRAWN.    07/30/22 1746   07/30/22 1745  VITAMIN D 25 Hydroxy (Vit-D Deficiency, Fractures)  Once,   URGENT        07/30/22 1744   07/30/22 1744  Vitamin B1  Once,   URGENT         07/30/22 1744   07/30/22 1744  Vitamin B6  Once,   URGENT        07/30/22 1744   07/30/22 1744  Vitamin B12  Once,   URGENT        07/30/22 1744   07/30/22 1537  Lactic acid, plasma  Now then every 2 hours,   R      07/30/22 1536   07/30/22 1525  CK  Once,   R        07/30/22 1525   07/30/22 1308  Urine Culture  Once,   URGENT       Question:  Indication  Answer:  Flank Pain   07/30/22 1308            Vitals/Pain Today's Vitals   07/30/22 1645 07/30/22 1745 07/30/22 1800 07/30/22 1815  BP: 127/81 118/85 114/80 121/82  Pulse: (!) 127 (!) 122 (!) 122 (!) 124  Resp: 18 (!) 22 (!) 22 Marland Kitchen)  22  Temp:      TempSrc:      SpO2: 99% 100% 100% 100%  Weight:      Height:      PainSc:        Isolation Precautions No active isolations  Medications Medications  0.9 %  sodium chloride infusion ( Intravenous New Bag/Given 07/30/22 1625)  docusate sodium (COLACE) capsule 100 mg (has no administration in time range)  polyethylene glycol (MIRALAX / GLYCOLAX) packet 17 g (has no administration in time range)  heparin injection 5,000 Units (has no administration in time range)  thiamine (VITAMIN B1) 500 mg in normal saline (50 mL) IVPB (has no administration in time range)  multivitamin with minerals tablet 1 tablet (has no administration in time range)  ondansetron (ZOFRAN-ODT) disintegrating tablet 4 mg (4 mg Oral Given 07/30/22 1034)  fentaNYL (SUBLIMAZE) injection 50 mcg (50 mcg Intravenous Given 07/30/22 1331)  sodium chloride 0.9 % bolus 1,000 mL (0 mLs Intravenous Stopped 07/30/22 1455)  ondansetron (ZOFRAN) injection 4 mg (4 mg Intravenous Given 07/30/22 1325)  pantoprazole (PROTONIX) injection 40 mg (40 mg Intravenous Given 07/30/22 1610)  dextrose 5 % bolus 1,000 mL (0 mLs Intravenous Stopped 07/30/22 1736)  fentaNYL (SUBLIMAZE) injection 50 mcg (50 mcg Intravenous Given 07/30/22 1610)  sodium chloride 0.9 % bolus 1,000 mL (1,000 mLs Intravenous New Bag/Given 07/30/22 1735)     Mobility walks with person assist Low fall risk   Focused Assessments    R Recommendations: See Admitting Provider Note  Report given to:   Additional Notes:

## 2022-07-30 NOTE — Progress Notes (Signed)
[facility] Progress Note Patient Name: Alyscia Carmon DOB: 1981-02-03 MRN: [id number]   Date of Service  07/30/2022  HPI/Events of Note  Notified of hyperglycemia.  Pt also requesting to resume trazodone which she takes nightly.  eICU Interventions  Trazodone ordered. Insulin sliding scale ordered q4hrs.      Intervention Category Intermediate Interventions: Other:  Elsie Lincoln 07/30/2022, 10:38 PM

## 2022-07-31 ENCOUNTER — Inpatient Hospital Stay (HOSPITAL_COMMUNITY): Payer: Commercial Managed Care - HMO

## 2022-07-31 DIAGNOSIS — R0609 Other forms of dyspnea: Secondary | ICD-10-CM

## 2022-07-31 DIAGNOSIS — E872 Acidosis, unspecified: Secondary | ICD-10-CM | POA: Diagnosis not present

## 2022-07-31 LAB — ECHOCARDIOGRAM COMPLETE
AR max vel: 2.57 cm2
AV Area VTI: 2.3 cm2
AV Area mean vel: 2.35 cm2
AV Mean grad: 3 mmHg
AV Peak grad: 5.6 mmHg
Ao pk vel: 1.18 m/s
Area-P 1/2: 3.74 cm2
Calc EF: 62.2 %
Height: 67 in
S' Lateral: 3.1 cm
Single Plane A2C EF: 64.9 %
Single Plane A4C EF: 58.5 %
Weight: 2589.08 oz

## 2022-07-31 LAB — URINALYSIS, ROUTINE W REFLEX MICROSCOPIC
Bilirubin Urine: NEGATIVE
Glucose, UA: 50 mg/dL — AB
Ketones, ur: 20 mg/dL — AB
Leukocytes,Ua: NEGATIVE
Nitrite: NEGATIVE
Protein, ur: NEGATIVE mg/dL
Specific Gravity, Urine: 1.004 — ABNORMAL LOW (ref 1.005–1.030)
pH: 5 (ref 5.0–8.0)

## 2022-07-31 LAB — CBC
HCT: 29.2 % — ABNORMAL LOW (ref 36.0–46.0)
Hemoglobin: 10.3 g/dL — ABNORMAL LOW (ref 12.0–15.0)
MCH: 36.9 pg — ABNORMAL HIGH (ref 26.0–34.0)
MCHC: 35.3 g/dL (ref 30.0–36.0)
MCV: 104.7 fL — ABNORMAL HIGH (ref 80.0–100.0)
Platelets: 109 10*3/uL — ABNORMAL LOW (ref 150–400)
RBC: 2.79 MIL/uL — ABNORMAL LOW (ref 3.87–5.11)
RDW: 13.1 % (ref 11.5–15.5)
WBC: 8.4 10*3/uL (ref 4.0–10.5)
nRBC: 0 % (ref 0.0–0.2)

## 2022-07-31 LAB — BASIC METABOLIC PANEL
Anion gap: 19 — ABNORMAL HIGH (ref 5–15)
Anion gap: 12 (ref 5–15)
BUN: 8 mg/dL (ref 6–20)
BUN: 7 mg/dL (ref 6–20)
CO2: 11 mmol/L — ABNORMAL LOW (ref 22–32)
CO2: 20 mmol/L — ABNORMAL LOW (ref 22–32)
Calcium: 8.5 mg/dL — ABNORMAL LOW (ref 8.9–10.3)
Calcium: 8.2 mg/dL — ABNORMAL LOW (ref 8.9–10.3)
Chloride: 101 mmol/L (ref 98–111)
Chloride: 104 mmol/L (ref 98–111)
Creatinine, Ser: 1.12 mg/dL — ABNORMAL HIGH (ref 0.44–1.00)
Creatinine, Ser: 0.94 mg/dL (ref 0.44–1.00)
GFR, Estimated: >60 mL/min (ref >60)
GFR, Estimated: >60 mL/min (ref >60)
Glucose, Bld: 166 mg/dL — ABNORMAL HIGH (ref 70–99)
Glucose, Bld: 105 mg/dL — ABNORMAL HIGH (ref 70–99)
Potassium: 4.5 mmol/L (ref 3.5–5.1)
Potassium: 3.8 mmol/L (ref 3.5–5.1)
Sodium: 131 mmol/L — ABNORMAL LOW (ref 135–145)
Sodium: 136 mmol/L (ref 135–145)

## 2022-07-31 LAB — LACTIC ACID, PLASMA: Lactic Acid, Venous: 2.6 mmol/L (ref 0.5–1.9)

## 2022-07-31 LAB — POTASSIUM
Potassium: 4.2 mmol/L (ref 3.5–5.1)
Potassium: 3.9 mmol/L (ref 3.5–5.1)
Potassium: 4 mmol/L (ref 3.5–5.1)
Potassium: 3.7 mmol/L (ref 3.5–5.1)

## 2022-07-31 LAB — VOLATILES,BLD-ACETONE,ETHANOL,ISOPROP,METHANOL
Acetone, blood: <0.01 g/dL (ref 0.000–0.010)
Ethanol, blood: <0.01 g/dL (ref 0.000–0.010)
Isopropanol, blood: <0.01 g/dL (ref 0.000–0.010)
Methanol, blood: <0.01 g/dL (ref 0.000–0.010)

## 2022-07-31 LAB — BLOOD GAS, VENOUS
Acid-base deficit: 3.6 mmol/L — ABNORMAL HIGH (ref 0.0–2.0)
Bicarbonate: 21.5 mmol/L (ref 20.0–28.0)
Drawn by: 6189
O2 Saturation: 71.8 %
Patient temperature: 37.5
pCO2, Ven: 39 mmHg — ABNORMAL LOW (ref 44–60)
pH, Ven: 7.35 (ref 7.25–7.43)
pO2, Ven: 36 mmHg (ref 32–45)

## 2022-07-31 LAB — HEMOGLOBIN A1C
Hgb A1c MFr Bld: 4.5 % — ABNORMAL LOW (ref 4.8–5.6)
Hgb A1c MFr Bld: 4.5 % — ABNORMAL LOW (ref 4.8–5.6)
Mean Plasma Glucose: 82.45 mg/dL
Mean Plasma Glucose: 82.45 mg/dL

## 2022-07-31 LAB — PROTIME-INR
INR: 1.2 (ref 0.8–1.2)
Prothrombin Time: 15.1 seconds (ref 11.4–15.2)

## 2022-07-31 LAB — OSMOLALITY, URINE: Osmolality, Ur: 195 mOsm/kg — ABNORMAL LOW (ref 300–900)

## 2022-07-31 LAB — GLUCOSE, CAPILLARY
Glucose-Capillary: 112 mg/dL — ABNORMAL HIGH (ref 70–99)
Glucose-Capillary: 85 mg/dL (ref 70–99)
Glucose-Capillary: 131 mg/dL — ABNORMAL HIGH (ref 70–99)
Glucose-Capillary: 116 mg/dL — ABNORMAL HIGH (ref 70–99)
Glucose-Capillary: 123 mg/dL — ABNORMAL HIGH (ref 70–99)

## 2022-07-31 LAB — OSMOLALITY: Osmolality: 300 mOsm/kg — ABNORMAL HIGH (ref 275–295)

## 2022-07-31 MED ORDER — PREGABALIN 25 MG PO CAPS
25.0000 mg | ORAL_CAPSULE | Freq: Two times a day (BID) | ORAL | Status: DC
  Administered 2022-07-31 – 2022-08-01 (×3): 25 mg via ORAL
  Filled 2022-07-31 (×3): qty 1

## 2022-07-31 MED ORDER — POTASSIUM CHLORIDE CRYS ER 20 MEQ PO TBCR
40.0000 meq | EXTENDED_RELEASE_TABLET | Freq: Once | ORAL | Status: AC
  Administered 2022-07-31: 40 meq via ORAL
  Filled 2022-07-31: qty 2

## 2022-07-31 MED ORDER — GABAPENTIN 300 MG PO CAPS
300.0000 mg | ORAL_CAPSULE | Freq: Three times a day (TID) | ORAL | Status: DC
  Administered 2022-07-31 – 2022-08-01 (×5): 300 mg via ORAL
  Filled 2022-07-31 (×5): qty 1

## 2022-07-31 MED ORDER — VITAMIN D (ERGOCALCIFEROL) 1.25 MG (50000 UNIT) PO CAPS
50000.0000 [IU] | ORAL_CAPSULE | ORAL | Status: DC
  Administered 2022-07-31: 50000 [IU] via ORAL
  Filled 2022-07-31: qty 1

## 2022-07-31 MED ORDER — ESCITALOPRAM OXALATE 10 MG PO TABS
20.0000 mg | ORAL_TABLET | Freq: Every day | ORAL | Status: DC
  Administered 2022-07-31 – 2022-08-01 (×2): 20 mg via ORAL
  Filled 2022-07-31 (×2): qty 2

## 2022-07-31 MED ORDER — SODIUM CHLORIDE 0.9 % IV BOLUS
500.0000 mL | Freq: Once | INTRAVENOUS | Status: AC
  Administered 2022-07-31: 500 mL via INTRAVENOUS

## 2022-07-31 MED ORDER — ENSURE ENLIVE PO LIQD
237.0000 mL | Freq: Two times a day (BID) | ORAL | Status: DC
  Administered 2022-07-31 – 2022-08-01 (×3): 237 mL via ORAL

## 2022-07-31 MED ORDER — INSULIN ASPART 100 UNIT/ML IJ SOLN: 0.0000 [IU] | Freq: Three times a day (TID) | INTRAMUSCULAR | Status: DC

## 2022-07-31 NOTE — Progress Notes (Signed)
[facility] Progress Note Patient Name: Adelena Desantiago DOB: 07/29/1981 MRN: [id number]   Date of Service  07/31/2022  HPI/Events of Note  K improved from 5.9 --> 4.5.   eICU Interventions  Recheck BMP in the AM.  Add lactate and venous pH.      Intervention Category Intermediate Interventions: Electrolyte abnormality - evaluation and management  Elsie Lincoln 07/31/2022, 3:01 AM

## 2022-07-31 NOTE — Progress Notes (Signed)
Patient has arrived to the floor

## 2022-07-31 NOTE — Progress Notes (Signed)
Initial Nutrition Assessment  DOCUMENTATION CODES:   Not applicable  INTERVENTION:  Continue regular diet, encourage good PO intake Ensure Enlive po BID, each supplement provides 350 kcal and 20 grams of protein. Continue MVI, thiamine, and folic acid supplements Vitamin D repletion, ergocalciferol, 50,000 IU weekly x 6 weeks   NUTRITION DIAGNOSIS:   Inadequate oral intake related to nausea, vomiting as evidenced by per patient/family report.  GOAL:   Patient will meet greater than or equal to 90% of their needs  MONITOR:   PO intake, Labs, Supplement acceptance  REASON FOR ASSESSMENT:   Consult Assessment of nutrition requirement/status  ASSESSMENT:   Pt with hx of asthma, Sjogren's syndrome, depression, and alcoholism presented to ED with low back pain with nausea, vomiting, and fevers.  Unable to reach pt on room phone at this time.   Reviewed chart. Workup concerning for alcoholic ketoacidosis. Pt denies EtOH use in 7 months. Blood alcohol 97 on admission. Imaging worrisome for cirrhosis and cholelithiasis.   Pt reports following a vegetarian diet at baseline. CCM NP requested several micronutrient labs, will monitor for results. Vitamin D resulted low, will add replacement.   No intake recorded, weight appears stable x 1 year.   Intake/Output Summary (Last 24 hours) at 07/31/2022 1343 Last data filed at 07/31/2022 1200 Gross per 24 hour  Intake 3633.81 ml  Output 3275 ml  Net 358.81 ml  Net IO Since Admission: 358.81 mL [07/31/22 1343]  Nutritionally Relevant Medications: Scheduled Meds:  folic acid  1 mg Oral Daily   insulin aspart  0-15 Units Subcutaneous Q4H   multivitamin with minerals  1 tablet Oral Daily   Continuous Infusions:  sodium chloride 100 mL/hr at 07/31/22 1200   thiamine (VITAMIN B1) injection Stopped (07/31/22 1040)   PRN Meds:.docusate sodium, polyethylene glycol  Labs Reviewed: CBG ranges from 85-294 mg/dL over the last 24  hours  Lab Results  Component Value Date   ALT 50 (H) 07/30/2022   AST 116 (H) 07/30/2022   ALKPHOS 369 (H) 07/30/2022   BILITOT 1.6 (H) 07/30/2022    Micronutrient Labs: CRP 2.4 Vitamin D 7.69 (low) Vitamin B12 255 Folate 3.4 Thiamine pending Vitamin B6 pending   NUTRITION - FOCUSED PHYSICAL EXAM: Defer to in-person assessment  Diet Order:   Diet Order             Diet regular Room service appropriate? Yes; Fluid consistency: Thin  Diet effective now                   EDUCATION NEEDS:   Not appropriate for education at this time  Skin:  Skin Assessment: Reviewed RN Assessment  Last BM:  prior to admission  Height:   Ht Readings from Last 1 Encounters:  07/30/22 5' 7"  (1.702 m)    Weight:   Wt Readings from Last 1 Encounters:  07/30/22 73.4 kg    Ideal Body Weight:  61.4 kg  BMI:  Body mass index is 25.34 kg/m.  Estimated Nutritional Needs:   Kcal:  1700-1900 kcal/d  Protein:  85-100 g/d  Fluid:  >/=1.8L/d    Ranell Patrick, RD, LDN Clinical Dietitian RD pager # available in [facility]  After hours/weekend pager # available in [facility]

## 2022-07-31 NOTE — Progress Notes (Signed)
   NAME:  Jeanette Elliott, MRN:  [id number], DOB:  Jul 30, 1981, LOS: 1 ADMISSION DATE:  07/30/2022, CONSULTATION DATE:  11/17 REFERRING MD:  Claudina Lick, CHIEF COMPLAINT:  Acidosis   History of Present Illness:  41 y/o female with multiple medical problems presented to the [facility] ER with back and abdominal pain and poor po intake and found to have a severe metabolic acidosis.  EtOH positive, ketones positive.    Pertinent  Medical History  Major depressive disorder EtOH abuse Sjogrens Moderate axonal sensorimotor polyneuropathy Hepatic steasosis  Significant Hospital Events: Including procedures, antibiotic start and stop dates in addition to other pertinent events   11/17 to ED with back anda bd pain. No acute explanation on imaging. Severe acidosis on labs. Admit to ICU; cortisol normal, B12 normal,  11/17 CT ab/pelv> circumferential bowel wall thickening in distal esophagus, hepatic steatosis, possible cirrhosis  Interim History / Subjective:   Remains tachycardic Low grade temp Lactic acid improved Osmol gap negative Feels better No back or belly pain Wants to go home  Objective   Blood pressure 104/71, pulse (!) 123, temperature 99 F (37.2 C), temperature source Oral, resp. rate (!) 24, height 5' 7"  (1.702 m), weight 73.4 kg, SpO2 97 %.        Intake/Output Summary (Last 24 hours) at 07/31/2022 0704 Last data filed at 07/31/2022 0200 Gross per 24 hour  Intake 1100.04 ml  Output 3275 ml  Net -2174.96 ml   Filed Weights   07/30/22 0958 07/30/22 1855  Weight: 68 kg 73.4 kg    Examination:  General:  Resting comfortably in bed HENT: NCAT OP clear PULM: CTA B, normal effort CV: RRR, no mgr GI: BS+, soft, nontender MSK: normal bulk and tone Neuro: awake, alert, no distress, MAEW  Resolved Hospital Problem list     Assessment & Plan:  Severe metabolic acidosis  > improved significantly Lactic acidosis > improved Hx EtOH abuse  Alcoholic hepatitis Alcoholic  steatosis, cirrhosis Hyperkalemia  > resolved Hypocalcemia Leukocytosis, mild  Glossitis + angular cheilitis + dermatitis  Hx Sjogrens  Hx depression Abdominal pain Macrocytic anemia  Picture remains concerning for alcoholic ketosis complicated by severe underlying nutritional deficiency.  Improved overall this morning.  Serum EtOH elevated yesterday but patient denies drinking.    Advance diet Stop bicarb infusion Check methylmalonic acid in AM Check Echo Monitor for EtOH withdrawal Continue thiamine, folate Check INR Stop PPI Resume home gabapentin, lexapro, lyrica  Counsel to quit drinking  Send to [place] service, PCCM off as of 11/19  Best Practice (right click and "Reselect all SmartList Selections" daily)   Diet/type: Regular consistency (see orders) DVT prophylaxis: prophylactic heparin  GI prophylaxis: N/A Lines: N/A Foley:  N/A Code Status:  full code Last date of multidisciplinary goals of care discussion [11/17]  Critical care time: n/a    Roselie Awkward, MD [facility] Pager: [phone] Cell: [phone] After 7:00 pm call Elink  [phone]

## 2022-07-31 NOTE — Progress Notes (Incomplete)
  Echocardiogram 2D Echocardiogram has been performed.  Jeanette Elliott 07/31/2022, 3:24 PM

## 2022-07-31 NOTE — Progress Notes (Addendum)
[facility] Progress Note Patient Name: Jeanette Elliott DOB: 1980/11/26 MRN: [id number]   Date of Service  07/31/2022  HPI/Events of Note  Notified of MAP in the high 70s but intermittent drops in SBP into the high 80s.   Pt has put out 3L of urine.   eICU Interventions  Give another 500cc NS bolus.      Intervention Category Intermediate Interventions: Hypotension - evaluation and management  Elsie Lincoln 07/31/2022, 3:37 AM  6:14 AM Lactate at 2.6 <-- >9 Venous pH 7.35  Plan> HCO3 gtt completed.

## 2022-08-01 ENCOUNTER — Encounter (HOSPITAL_COMMUNITY): Payer: Self-pay

## 2022-08-01 DIAGNOSIS — E872 Acidosis, unspecified: Secondary | ICD-10-CM | POA: Diagnosis not present

## 2022-08-01 DIAGNOSIS — E8729 Other acidosis: Secondary | ICD-10-CM | POA: Diagnosis not present

## 2022-08-01 LAB — COMPREHENSIVE METABOLIC PANEL
ALT: 37 U/L (ref 0–44)
AST: 170 U/L — ABNORMAL HIGH (ref 15–41)
Albumin: 3 g/dL — ABNORMAL LOW (ref 3.5–5.0)
Alkaline Phosphatase: 294 U/L — ABNORMAL HIGH (ref 38–126)
Anion gap: 11 (ref 5–15)
BUN: 6 mg/dL (ref 6–20)
CO2: 20 mmol/L — ABNORMAL LOW (ref 22–32)
Calcium: 8.3 mg/dL — ABNORMAL LOW (ref 8.9–10.3)
Chloride: 104 mmol/L (ref 98–111)
Creatinine, Ser: 0.86 mg/dL (ref 0.44–1.00)
GFR, Estimated: >60 mL/min (ref >60)
Glucose, Bld: 131 mg/dL — ABNORMAL HIGH (ref 70–99)
Potassium: 3.5 mmol/L (ref 3.5–5.1)
Sodium: 135 mmol/L (ref 135–145)
Total Bilirubin: 1.4 mg/dL — ABNORMAL HIGH (ref 0.3–1.2)
Total Protein: 5.2 g/dL — ABNORMAL LOW (ref 6.5–8.1)

## 2022-08-01 LAB — POTASSIUM
Potassium: 3 mmol/L — ABNORMAL LOW (ref 3.5–5.1)
Potassium: 3.5 mmol/L (ref 3.5–5.1)

## 2022-08-01 LAB — URINE CULTURE: Culture: NO GROWTH

## 2022-08-01 LAB — GLUCOSE, CAPILLARY
Glucose-Capillary: 105 mg/dL — ABNORMAL HIGH (ref 70–99)
Glucose-Capillary: 100 mg/dL — ABNORMAL HIGH (ref 70–99)
Glucose-Capillary: 130 mg/dL — ABNORMAL HIGH (ref 70–99)
Glucose-Capillary: 108 mg/dL — ABNORMAL HIGH (ref 70–99)

## 2022-08-01 LAB — BASIC METABOLIC PANEL
Anion gap: 12 (ref 5–15)
BUN: <5 mg/dL — ABNORMAL LOW (ref 6–20)
CO2: 23 mmol/L (ref 22–32)
Calcium: 8.5 mg/dL — ABNORMAL LOW (ref 8.9–10.3)
Chloride: 104 mmol/L (ref 98–111)
Creatinine, Ser: 0.78 mg/dL (ref 0.44–1.00)
GFR, Estimated: >60 mL/min (ref >60)
Glucose, Bld: 102 mg/dL — ABNORMAL HIGH (ref 70–99)
Potassium: 3.9 mmol/L (ref 3.5–5.1)
Sodium: 139 mmol/L (ref 135–145)

## 2022-08-01 LAB — MAGNESIUM: Magnesium: 2.1 mg/dL (ref 1.7–2.4)

## 2022-08-01 LAB — RHEUMATOID FACTOR: Rheumatoid fact SerPl-aCnc: 10.8 IU/mL (ref <14.0)

## 2022-08-01 LAB — PHOSPHORUS
Phosphorus: <1 mg/dL — CL (ref 2.5–4.6)
Phosphorus: 2.5 mg/dL (ref 2.5–4.6)

## 2022-08-01 MED ORDER — THIAMINE HCL 100 MG PO TABS: 100.0000 mg | ORAL_TABLET | Freq: Every day | ORAL | 0 refills | Status: AC

## 2022-08-01 MED ORDER — POTASSIUM CHLORIDE CRYS ER 20 MEQ PO TBCR
40.0000 meq | EXTENDED_RELEASE_TABLET | ORAL | Status: AC
  Administered 2022-08-01 (×2): 40 meq via ORAL
  Filled 2022-08-01 (×2): qty 2

## 2022-08-01 MED ORDER — VITAMIN D (ERGOCALCIFEROL) 1.25 MG (50000 UNIT) PO CAPS: 50000.0000 [IU] | ORAL_CAPSULE | ORAL | 0 refills | Status: AC

## 2022-08-01 MED ORDER — ADULT MULTIVITAMIN W/MINERALS CH: 1.0000 | ORAL_TABLET | Freq: Every day | ORAL | Status: AC

## 2022-08-01 MED ORDER — TRAZODONE HCL 50 MG PO TABS: 50.0000 mg | ORAL_TABLET | Freq: Every evening | ORAL | Status: AC | PRN

## 2022-08-01 MED ORDER — SODIUM PHOSPHATES 45 MMOLE/15ML IV SOLN
45.0000 mmol | Freq: Once | INTRAVENOUS | Status: AC
  Administered 2022-08-01: 45 mmol via INTRAVENOUS
  Filled 2022-08-01: qty 15

## 2022-08-01 NOTE — Discharge Instructions (Signed)

## 2022-08-01 NOTE — Discharge Summary (Signed)
Physician Discharge Summary  Jeanette Elliott SWN:[id number] DOB: 22-Jan-1981  PCP: Haydee Salter, MD  Admitted from: Home Discharged to: Home  Admit date: 07/30/2022 Discharge date: 08/01/2022  Recommendations for Outpatient Follow-up:    Follow-up Information     Haydee Salter, MD. Schedule an appointment as soon as possible for a visit in 1 week(s).   Specialty: Family Medicine Why: To be seen with repeat labs (CBC, CMP, magnesium and phosphorus). Contact information: [place] [address] [phone]         [name], NP Follow up.   Specialty: Behavioral Health Why: Call for an appointment on Monday, 08/02/2022 to be seen as soon as possible.  Kindly take all of your behavioral health medications for review during this visit. Contact information: [address] [place] [address] [phone]                  Home Health: None    Equipment/Devices: None    Discharge Condition: Improved and stable.   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  Associated  decreased oral intake for 2 days.  1 day PTA, was only able to eat 1 cup of applesauce and fluids.  Day of presentation, fluids only.  Reports following a vegetarian diet at baseline and usually intake is normal but did not elaborate when asked further on admission.  Has some baseline pain in her extremities attributed to neuropathy for which she is on gabapentin and Lyrica.  No seizures, LOC and recent medication changes.    In the ED, CT A/P with hepatic steatosis, nodularity of the liver, no hydronephrosis, may be some esophagitis, "misty" mesentery which may be sequelae of prior processes such as pancreatitis.  Lab work showed severe metabolic acidosis with pH of 6.8, lactic acid >9, elevated LFTs, blood alcohol level 97, UDS negative, UA with 20 ketones.  PCCM admitted her to ICU.  After stabilization, care was transferred to the floor and [facility] service on 11/19.  Assessment and plan:  Severe metabolic acidosis/lactic acidosis: On admission labs, serum bicarbonate <7, anion gap not calculated.  Blood alcohol level in the 90s on presentation.  Patient has consistently denied alcohol use since approximately 8 months ago when she quit drinking.  Even prior to that she reports that she used to drink a couple of shots of vodka on the weekends.  She is unable to explain how her blood alcohol level is high.  She states that she was offered a call by a friend the day prior to admission and the friend also  notes that she does not drink and does not think that her drink was spiked with alcohol.  PCCM felt that this was likely due to nutrient deficiency related given poor intake and recent history but also wonder if there is a more chronic underlying nutrient deficiency versus starvation ketosis.  She follows a vegetarian diet.  Questionable history of pancreatitis with CT findings and abdominal pain.  She was afebrile.  UA was not consistent with UTI.  Chest x-ray without pneumonia.  CT A/P without convincing  infectious suggestion.  She was admitted to ICU.  Aggressively hydrated with IV fluids.  Then placed on bicarbonate drip.  She was also treated with high-dose IV thiamine.  She was extensively worked up as per results noted below.  Home behavioral health meds were temporarily held.  Her metabolic acidosis has resolved.  Overall etiology of this is unclear but per PCCM, concerning for alcoholic ketosis complicated by severe underlying nutritional deficiency.  Hyperkalemia/hypokalemia: Potassium peaked at 5.9.  Hyperkalemia resolved.  Subsequent hypokalemia, replaced, follow BMP later tonight prior to discharge to ensure normal potassium levels prior to discharge.  Magnesium normal.  Hypophosphatemia: Phosphorus <1.  Consulted pharmacy for IV replacement.  This is expected to take approximately 6 hours with follow-up labs 2 hours later.  Nocturnist TRH MD to follow these results and if stable, patient to be discharged home with close outpatient follow-up with repeat labs with PCP.  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  Continue thiamine and folate and multivitamin supplementations.  Continue alcohol abstinence counseled.  INR 1.2.  Hypocalcemia: Improved.  Mild leukocytosis: Likely reactive.  Resolved.  Glossitis/angular cheilitis/dermatitis: Suspect some form of nutritional deficiency.  Outpatient follow-up.  History of Sjogren's: Not on any specific medication for same.  Anxiety/depression/bipolar disorder: Patient reports that she follows up with outpatient psychiatry as noted above.  Reviewed her entire home medication list with her.  Apart from [place] which she reportedly says that she must be taking but does not appear to have these medications (indicate some form of issues with her debit card and hence the prescription is not going through for mail-in prescriptions), she confirms  that she takes all other medications listed below.  She has been advised to closely follow with her outpatient psychiatrist regarding med management.  Abdominal pain: Unclear etiology.  Resolved.  Tolerating diet.  Having BMs.  Macrocytic anemia: Hemoglobin dropped from 14-15 on admission to 10.3.  Much of this may be dilutional.  These numbers are stable compared to her hemoglobin in early October.  No overt bleeding reported.  Follow CBCs closely as outpatient.  Thrombocytopenia: Appears chronic and intermittent.  Had platelet count of 112 on 06/13/2022.  Close outpatient follow-up with repeat CBCs.  No bleeding reported.  Vitamin D deficiency: High-dose vitamin D supplements and follow-up vitamin D levels in 4 to 6 weeks.  Folate deficiency: Nutritional versus?  Ongoing alcohol intake although no reason not to believe patient stating that she is not currently consuming alcohol.  Folate supplementation and outpatient follow-up.  History of leg edema: Patient reports that she is on Lasix and Aldactone for this.  Currently without leg edema and appears clinically euvolemic.  No history of CHF.  2D echo shows normal LVEF and is essentially unremarkable.  Given her severe electrolyte abnormalities and current euvolemic status, held Lasix and Aldactone at discharge until close outpatient follow-up with her PCP.   Resulted labs: Lipase:  29 > 44, HS Troponin: 17 > 23, blood alcohol level 97, A1c 4.5 > 4.5, i-STAT beta-hCG <5, UDS negative.  Beta hydroxybutyrate 4.78.  CK 78.  Initial serum lactate >9.  VBG pH 6.819.  B12: 255.  Vitamin D, 25-hydroxy: 7.69.  Cortisol 49.9, folate 3.4, CRP 2.4, ESR 1, volatile's, blood (acetone, ethanol, isopropanol and methanol): Negative, salicylate <7.  Serum osmolality 300, urine osmolality 195.  Ferritin 119.  Pending results that need to be followed up as outpatient by PCP: Vitamin B1, B6, ANA, ANCA, RF, heavy metals profile, urine culture, methylmalonic  acid   Consultations: PCCM admitted patient initially  Procedures: None   Discharge Instructions  Discharge Instructions     Activity as tolerated - No restrictions   Complete by: As directed    Call MD for:  difficulty breathing, headache or visual disturbances   Complete by: As directed    Call MD for:  extreme fatigue   Complete by: As directed    Call MD for:  persistant dizziness or light-headedness   Complete by: As directed    Call MD for:  persistant nausea and vomiting   Complete by: As directed    Call MD for:  severe uncontrolled pain   Complete by: As directed    Call MD for:  temperature >100.4   Complete by: As directed    Diet - low sodium heart healthy   Complete by: As directed         Medication List     STOP taking these medications    Auvelity 45-105 MG Tbcr Generic drug: Dextromethorphan-buPROPion ER   dicyclomine 10 MG capsule Commonly known as: BENTYL   furosemide 20 MG tablet Commonly known as: LASIX   ibuprofen 200 MG tablet Commonly known as: ADVIL   naltrexone 50 MG tablet Commonly known as: DEPADE   potassium chloride SA 20 MEQ tablet Commonly known as: KLOR-CON M   spironolactone 50 MG tablet Commonly known as: ALDACTONE       TAKE these medications    albuterol 108 (90 Base) MCG/ACT inhaler Commonly known as: VENTOLIN HFA Inhale 2 puffs into the lungs every 6 (six) hours as needed (asthma attacks).   cariprazine 1.5 MG capsule Commonly known as: Vraylar Take 1 capsule (1.5 mg total) by mouth daily.   cholecalciferol 25 MCG (1000 UNIT) tablet Commonly known as: VITAMIN D3 Take 1,000 Units by mouth daily.   cyanocobalamin 1000 MCG/ML injection Commonly known as: VITAMIN B12 Inject 1,000 mcg into the muscle every 30 (thirty) days.   EPINEPHrine 0.3 mg/0.3 mL Soaj injection Commonly known as: EPI-PEN Inject 0.3 mg into the muscle once as needed for anaphylaxis.   escitalopram 20 MG tablet Commonly known  as: LEXAPRO Take 1 tablet (20 mg total) by mouth daily.   folic acid 1 MG tablet Commonly known as: FOLVITE TAKE 1 TABLET(1 MG) BY MOUTH DAILY What changed: See the new instructions.   gabapentin 600 MG tablet Commonly known as: NEURONTIN Take 600 mg by mouth 3 (three) times daily.   hydrOXYzine 25 MG tablet Commonly known as: ATARAX Take 1 tablet (25 mg total) by mouth 2 (two) times daily.   multivitamin with minerals Tabs tablet Take 1 tablet by mouth daily. Start taking on: August 02, 2022   pregabalin 25 MG capsule Commonly known as: LYRICA TAKE ONE CAPSULE BY MOUTH TWICE A DAY   SYSTANE FREE OP Apply 1 drop to eye daily as needed (dryness).   thiamine 100 MG  tablet Commonly known as: VITAMIN B1 Take 1 tablet (100 mg total) by mouth daily.   traZODone 50 MG tablet Commonly known as: DESYREL Take 1 tablet (50 mg total) by mouth at bedtime as needed for sleep. What changed: See the new instructions.   vitamin A 25000 UNIT capsule Take 25,000 Units by mouth daily.   Vitamin D (Ergocalciferol) 1.25 MG (50000 UNIT) Caps capsule Commonly known as: DRISDOL Take 1 capsule (50,000 Units total) by mouth once a week. Start taking on: August 07, 2022   ZOLMitriptan 2.5 MG tablet Commonly known as: Zomig Take 1 tablet (2.5 mg total) by mouth once for 1 dose. May repeat in 2 hours if headache persists or recurs.       Allergies  Allergen Reactions   Iodinated Contrast Media Itching   Latex Itching and Rash   Keflex [Cephalexin] Diarrhea and Nausea And Vomiting      Procedures/Studies: ECHOCARDIOGRAM COMPLETE  Result Date: 07/31/2022    ECHOCARDIOGRAM REPORT   Patient Name:   Jeanette Elliott Date of Exam: 07/31/2022 Medical Rec #:  [id number]     Height:       67.0 in Accession #:    [id number]    Weight:       161.8 lb Date of Birth:  04-Feb-1981    BSA:          1.848 m Patient Age:    74 years      BP:           92/69 mmHg Patient Gender: F             HR:            77 bpm. Exam Location:  Inpatient Procedure: 2D Echo Indications:    dyspnea  History:        Patient has no prior history of Echocardiogram examinations.  Sonographer:    Harvie Junior Referring Phys: [place]  1. Left ventricular ejection fraction, by estimation, is 60 to 65%. The left ventricle has normal function. The left ventricle has no regional wall motion abnormalities. Left ventricular diastolic parameters were normal.  2. Right ventricular systolic function is normal. The right ventricular size is normal. Tricuspid regurgitation signal is inadequate for assessing PA pressure.  3. The mitral valve is normal in structure. No evidence of mitral valve regurgitation. No evidence of mitral stenosis.  4. The aortic valve is tricuspid. Aortic valve regurgitation is not visualized. No aortic stenosis is present.  5. The inferior vena cava is normal in size with greater than 50% respiratory variability, suggesting right atrial pressure of 3 mmHg. Comparison(s): No prior Echocardiogram. FINDINGS  Left Ventricle: Left ventricular ejection fraction, by estimation, is 60 to 65%. The left ventricle has normal function. The left ventricle has no regional wall motion abnormalities. The left ventricular internal cavity size was normal in size. There is  no left ventricular hypertrophy. Left ventricular diastolic parameters were normal. Right Ventricle: The right ventricular size is normal. Right ventricular systolic function is normal. Tricuspid regurgitation signal is inadequate for assessing PA pressure. The tricuspid regurgitant velocity is 2.05 m/s, and with an assumed right atrial  pressure of 3 mmHg, the estimated right ventricular systolic pressure is 84.1 mmHg. Left Atrium: Left atrial size was normal in size. Right Atrium: Right atrial size was normal in size. Pericardium: There is no evidence of pericardial effusion. Mitral Valve: The mitral valve is normal in structure. No evidence  of mitral valve regurgitation.  No evidence of mitral valve stenosis. Tricuspid Valve: The tricuspid valve is normal in structure. Tricuspid valve regurgitation is trivial. No evidence of tricuspid stenosis. Aortic Valve: The aortic valve is tricuspid. Aortic valve regurgitation is not visualized. No aortic stenosis is present. Aortic valve mean gradient measures 3.0 mmHg. Aortic valve peak gradient measures 5.6 mmHg. Aortic valve area, by VTI measures 2.30 cm. Pulmonic Valve: The pulmonic valve was normal in structure. Pulmonic valve regurgitation is not visualized. No evidence of pulmonic stenosis. Aorta: The aortic root is normal in size and structure. Venous: The inferior vena cava is normal in size with greater than 50% respiratory variability, suggesting right atrial pressure of 3 mmHg. IAS/Shunts: No atrial level shunt detected by color flow Doppler.  LEFT VENTRICLE PLAX 2D LVIDd:         4.50 cm     Diastology LVIDs:         3.10 cm     LV e' medial:    10.40 cm/s LV PW:         0.70 cm     LV E/e' medial:  7.8 LV IVS:        0.60 cm     LV e' lateral:   13.20 cm/s LVOT diam:     2.10 cm     LV E/e' lateral: 6.1 LV SV:         57 LV SV Index:   31 LVOT Area:     3.46 cm  LV Volumes (MOD) LV vol d, MOD A2C: 90.5 ml LV vol d, MOD A4C: 87.8 ml LV vol s, MOD A2C: 31.8 ml LV vol s, MOD A4C: 36.4 ml LV SV MOD A2C:     58.7 ml LV SV MOD A4C:     87.8 ml LV SV MOD BP:      56.1 ml RIGHT VENTRICLE RV Basal diam:  3.10 cm RV Mid diam:    3.60 cm RV S prime:     10.20 cm/s TAPSE (M-mode): 1.7 cm LEFT ATRIUM           Index        RIGHT ATRIUM          Index LA diam:      3.10 cm 1.68 cm/m   RA Area:     8.76 cm LA Vol (A4C): 23.8 ml 12.88 ml/m  RA Volume:   15.40 ml 8.33 ml/m  AORTIC VALVE                    PULMONIC VALVE AV Area (Vmax):    2.57 cm     PV Vmax:       0.78 m/s AV Area (Vmean):   2.35 cm     PV Peak grad:  2.4 mmHg AV Area (VTI):     2.30 cm AV Vmax:           118.00 cm/s AV Vmean:           88.100 cm/s AV VTI:            0.249 m AV Peak Grad:      5.6 mmHg AV Mean Grad:      3.0 mmHg LVOT Vmax:         87.50 cm/s LVOT Vmean:        59.800 cm/s LVOT VTI:          0.165 m LVOT/AV VTI ratio: 0.66  AORTA Ao Root diam: 2.90 cm MITRAL VALVE  TRICUSPID VALVE MV Area (PHT): 3.74 cm    TR Peak grad:   16.8 mmHg MV Decel Time: 203 msec    TR Vmax:        205.00 cm/s MV E velocity: 80.60 cm/s MV A velocity: 70.30 cm/s  SHUNTS MV E/A ratio:  1.15        Systemic VTI:  0.16 m                            Systemic Diam: 2.10 cm Kirk Ruths MD Electronically signed by Kirk Ruths MD Signature Date/Time: 07/31/2022/3:34:14 PM    Final    CT ABDOMEN PELVIS WO CONTRAST  Result Date: 07/30/2022 CLINICAL DATA:  Back pain, vomiting EXAM: CT ABDOMEN AND PELVIS WITHOUT CONTRAST TECHNIQUE: Multidetector CT imaging of the abdomen and pelvis was performed following the standard protocol without IV contrast. RADIATION DOSE REDUCTION: This exam was performed according to the departmental dose-optimization program which includes automated exposure control, adjustment of the mA and/or kV according to patient size and/or use of iterative reconstruction technique. COMPARISON:  June 12, 2022 FINDINGS: Evaluation is limited by lack of IV contrast. Lower chest: No acute abnormality. Hepatobiliary: Hepatic steatosis and hepatomegaly. Cholelithiasis without ancillary evidence of acute cholecystitis. Mildly nodular appearance of the liver. Pancreas: No new peripancreatic fat stranding. Spleen: Unremarkable. Adrenals/Urinary Tract: Adrenal glands are unremarkable. Similar appearance of a RIGHT-sided at inferior pole renal calcification. No hydronephrosis. Bladder is moderately distended. Stomach/Bowel: No evidence of bowel obstruction. Circumferential bowel wall thickening of the distal esophagus. Appendix is normal. Resolution ileal wall thickening. Vascular/Lymphatic: Age advanced atherosclerotic  calcifications. No new suspicious lymphadenopathy. Reproductive: Uterus and bilateral adnexa are unremarkable. Other: Similar appearance of a mildly misty mesentery, likely sequela of prior pancreatitis or other inflammatory event. No free air or free fluid. Musculoskeletal: Remote rib fractures. IMPRESSION: 1. Circumferential bowel wall thickening of the distal esophagus could reflect esophagitis in the appropriate clinical setting. 2. Hepatic steatosis and hepatomegaly. Mildly nodular appearance of the liver could reflect underlying cirrhosis. 3. Cholelithiasis without ancillary evidence of acute cholecystitis. 4. Age advanced atherosclerotic calcifications. Electronically Signed   By: Valentino Saxon M.D.   On: 07/30/2022 14:43   DG Chest 2 View  Result Date: 07/30/2022 CLINICAL DATA:  Shortness of breath EXAM: CHEST - 2 VIEW COMPARISON:  06/12/2022 FINDINGS: The heart size and mediastinal contours are within normal limits. Both lungs are clear. The visualized skeletal structures are unremarkable. IMPRESSION: No active cardiopulmonary disease. Electronically Signed   By: Davina Poke D.O.   On: 07/30/2022 10:58      Subjective: Patient denies complaints and is asking to go home.  Tolerating diet without difficulty.  No dysphagia, nausea, vomiting, abdominal pain or heartburn.  No diarrhea.  Rest of history as noted above.  Discharge Exam:  Vitals:   08/01/22 0417 08/01/22 0500 08/01/22 0745 08/01/22 1208  BP: 92/72  101/73 100/71  Pulse: 78  80 95  Resp: _0 Temp: 98.7 F (37.1 C)  98.8 F (37.1 C) 98.7 F (37.1 C)  TempSrc: Oral  Oral Oral  SpO2: 99%  97% 97%  Weight:  75.4 kg    Height:        General: Pleasant young female, moderately built and nourished seen ambulating steadily and comfortably in the room by herself. Cardiovascular: S1 & S2 heard, RRR, S1/S2 +. No murmurs, rubs, gallops or clicks. No JVD or pedal edema. Respiratory: Clear  to auscultation without  wheezing, rhonchi or crackles. No increased work of breathing. Abdominal:  Non distended, non tender & soft. No organomegaly or masses appreciated. Normal bowel sounds heard. CNS: Alert and oriented. No focal deficits. Extremities: no edema, no cyanosis    The results of significant diagnostics from this hospitalization (including imaging, microbiology, ancillary and laboratory) are listed below for reference.     Microbiology: Recent Results (from the past 240 hour(s))  MRSA Next Gen by PCR, Nasal     Status: None   Collection Time: 07/30/22  8:34 PM   Specimen: Nasal Mucosa; Nasal Swab  Result Value Ref Range Status   MRSA by PCR Next Gen NOT DETECTED NOT DETECTED Final    Comment: (NOTE) The GeneXpert MRSA Assay (FDA approved for NASAL specimens only), is one component of a comprehensive MRSA colonization surveillance program. It is not intended to diagnose MRSA infection nor to guide or monitor treatment for MRSA infections. Test performance is not FDA approved in patients less than 74 years old. Performed at [facility], [place] [address], [place], [place] [address]      Labs: CBC: Recent Labs  Lab 07/30/22 1035 07/30/22 1632 07/31/22 0449  WBC 13.3*  --  8.4  NEUTROABS 9.8*  --   --   HGB 14.3 15.0 10.3*  HCT 44.2 44.0 29.2*  MCV 114.8*  --  104.7*  PLT 278  --  109*    Basic Metabolic Panel: Recent Labs  Lab 07/30/22 1035 07/30/22 1632 07/30/22 1910 07/31/22 0029 07/31/22 0449 07/31/22 0729 07/31/22 1638 07/31/22 2002 08/01/22 0036 08/01/22 0331 08/01/22 0810  NA 137 131* 131* 131* 136  --   --   --  135  --   --   K 5.7* 5.7* 5.9* 4.5 3.8   < > 4.0 3.7 3.5 3.0* 3.5  CL 99  --  98 101 104  --   --   --  104  --   --   CO2 <7*  --  <7* 11* 20*  --   --   --  20*  --   --   GLUCOSE 102*  --  274* 166* 105*  --   --   --  131*  --   --   BUN 12  --  _0 --   --   --  6  --   --   CREATININE 1.02*  --  1.10* 1.12* 0.94  --   --   --  0.86  --    --   CALCIUM 9.6  --  8.2* 8.5* 8.2*  --   --   --  8.3*  --   --   MG  --   --  1.8  --   --   --   --   --   --   --  2.1  PHOS  --   --  3.2  --   --   --   --   --   --   --  <1.0*   < > = values in this interval not displayed.    Liver Function Tests: Recent Labs  Lab 07/30/22 1035 08/01/22 0036  AST 116* 170*  ALT 50* 37  ALKPHOS 369* 294*  BILITOT 1.6* 1.4*  PROT 8.1 5.2*  ALBUMIN 4.5 3.0*    CBG: Recent Labs  Lab 07/31/22 1111 07/31/22 1609 07/31/22 2049 08/01/22 0743 08/01/22 1214  GLUCAP  131* 116* 123* 105* 100*    Hgb A1c Recent Labs    07/30/22 1035 07/31/22 0449  HGBA1C 4.5* 4.5*    Anemia work up Recent Labs    07/30/22 1910  VITAMINB12 255  FOLATE 3.4*  FERRITIN 119  TIBC 375  IRON 336*    Urinalysis    Component Value Date/Time   COLORURINE STRAW (A) 07/31/2022 0215   APPEARANCEUR CLEAR 07/31/2022 0215   LABSPEC 1.004 (L) 07/31/2022 0215   PHURINE 5.0 07/31/2022 0215   GLUCOSEU 50 (A) 07/31/2022 0215   HGBUR SMALL (A) 07/31/2022 0215   BILIRUBINUR NEGATIVE 07/31/2022 0215   BILIRUBINUR negative 04/07/2022 1050   KETONESUR 20 (A) 07/31/2022 0215   PROTEINUR NEGATIVE 07/31/2022 0215   UROBILINOGEN 0.2 04/07/2022 1050   NITRITE NEGATIVE 07/31/2022 0215   LEUKOCYTESUR NEGATIVE 07/31/2022 0215      Time coordinating discharge: 45 minutes  SIGNED:  Vernell Leep, MD,  FACP, [place], [place], [facility], [facility]   [facility] [place]     To contact the attending provider between 7A-7P or the covering provider during after hours 7P-7A, please log into the web site [email or web address] and access using universal [place] password for that web site. If you do not have the password, please call the hospital operator.

## 2022-08-02 ENCOUNTER — Telehealth: Payer: Self-pay

## 2022-08-02 ENCOUNTER — Other Ambulatory Visit: Payer: Self-pay | Admitting: Neurology

## 2022-08-02 LAB — ENA+DNA/DS+ANTICH+CENTRO+JO...
Anti JO-1: <0.2 AI (ref 0.0–0.9)
Centromere Ab Screen: <0.2 AI (ref 0.0–0.9)
Chromatin Ab SerPl-aCnc: <0.2 AI (ref 0.0–0.9)
ENA SM Ab Ser-aCnc: <0.2 AI (ref 0.0–0.9)
Ribonucleic Protein: 1.6 AI — ABNORMAL HIGH (ref 0.0–0.9)
SSA (Ro) (ENA) Antibody, IgG: <0.2 AI (ref 0.0–0.9)
SSB (La) (ENA) Antibody, IgG: <0.2 AI (ref 0.0–0.9)
Scleroderma (Scl-70) (ENA) Antibody, IgG: <0.2 AI (ref 0.0–0.9)
ds DNA Ab: 1 IU/mL (ref 0–9)

## 2022-08-02 LAB — ANA W/REFLEX IF POSITIVE: Anti Nuclear Antibody (ANA): POSITIVE — AB

## 2022-08-02 NOTE — Telephone Encounter (Signed)
Transition Care Management Follow-up Telephone Call Date of discharge and from where: 08/01/22 [facility]. Dx: Recurring UTI flank pain, lactic acidosis. How have you been since you were released from the hospital? I'm better now. Any questions or concerns? No  Items Reviewed: Did the pt receive and understand the discharge instructions provided? Yes  Medications obtained and verified? Yes  Other? No  Any new allergies since your discharge? Yes  Dietary orders reviewed? Yes Do you have support at home? Yes   Home Care and Equipment/Supplies: Were home health services ordered? not applicable If so, what is the name of the agency? N/a  Has the agency set up a time to come to the patient's home? not applicable Were any new equipment or medical supplies ordered?  No What is the name of the medical supply agency? N/a Were you able to get the supplies/equipment? not applicable Do you have any questions related to the use of the equipment or supplies? No  Functional Questionnaire: (I = Independent and D = Dependent) ADLs: I  Bathing/Dressing- I  Meal Prep- I  Eating- I  Maintaining continence- I  Transferring/Ambulation- I  Managing Meds- I  Follow up appointments reviewed:  PCP Hospital f/u appt confirmed? Yes Scheduled to see Dr. Gena Fray on 08/03/22 @ 3:20pm. [facility] f/u appt confirmed? No  Scheduled to see n/a on n/a @ n/a. Are transportation arrangements needed? No  If their condition worsens, is the pt aware to call PCP or go to the Emergency Dept.? Yes Was the patient provided with contact information for the PCP's office or ED? Yes Was to pt encouraged to call back with questions or concerns? Yes

## 2022-08-03 ENCOUNTER — Ambulatory Visit (INDEPENDENT_AMBULATORY_CARE_PROVIDER_SITE_OTHER): Payer: Commercial Managed Care - HMO | Admitting: Family Medicine

## 2022-08-03 ENCOUNTER — Encounter: Payer: Self-pay | Admitting: Family Medicine

## 2022-08-03 VITALS — BP 110/68 | HR 83 | Temp 97.4°F | Ht 67.0 in | Wt 163.0 lb

## 2022-08-03 DIAGNOSIS — K7030 Alcoholic cirrhosis of liver without ascites: Secondary | ICD-10-CM | POA: Diagnosis not present

## 2022-08-03 DIAGNOSIS — E8729 Other acidosis: Secondary | ICD-10-CM

## 2022-08-03 DIAGNOSIS — F418 Other specified anxiety disorders: Secondary | ICD-10-CM

## 2022-08-03 DIAGNOSIS — F1021 Alcohol dependence, in remission: Secondary | ICD-10-CM

## 2022-08-03 DIAGNOSIS — F4321 Adjustment disorder with depressed mood: Secondary | ICD-10-CM

## 2022-08-03 LAB — ANCA PROFILE
Anti-MPO Antibodies: <0.2 units (ref 0.0–0.9)
Anti-PR3 Antibodies: <0.2 units (ref 0.0–0.9)
Atypical P-ANCA titer: <1:20 {titer}
C-ANCA: <1:20 {titer}
P-ANCA: <1:20 {titer}

## 2022-08-03 LAB — VITAMIN B6: Vitamin B6: 2.7 ug/L — ABNORMAL LOW (ref 3.4–65.2)
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
## 2022-08-04 ENCOUNTER — Telehealth: Payer: Self-pay

## 2022-08-04 LAB — COMPREHENSIVE METABOLIC PANEL
ALT: 53 U/L — ABNORMAL HIGH (ref 0–35)
AST: 127 U/L — ABNORMAL HIGH (ref 0–37)
Albumin: 3.9 g/dL (ref 3.5–5.2)
Alkaline Phosphatase: 356 U/L — ABNORMAL HIGH (ref 39–117)
BUN: 6 mg/dL (ref 6–23)
CO2: 31 mEq/L (ref 19–32)
Calcium: 9.1 mg/dL (ref 8.4–10.5)
Chloride: 100 mEq/L (ref 96–112)
Creatinine, Ser: 0.62 mg/dL (ref 0.40–1.20)
GFR: 111.67 mL/min (ref >60.00)
Glucose, Bld: 97 mg/dL (ref 70–99)
Potassium: 4 mEq/L (ref 3.5–5.1)
Sodium: 137 mEq/L (ref 135–145)
Total Bilirubin: 1.5 mg/dL — ABNORMAL HIGH (ref 0.2–1.2)
Total Protein: 6.4 g/dL (ref 6.0–8.3)

## 2022-08-04 LAB — CBC
HCT: 34.9 % — ABNORMAL LOW (ref 36.0–46.0)
Hemoglobin: 12 g/dL (ref 12.0–15.0)
MCHC: 34.4 g/dL (ref 30.0–36.0)
MCV: 107.4 fl — ABNORMAL HIGH (ref 78.0–100.0)
Platelets: 111 10*3/uL — ABNORMAL LOW (ref 150.0–400.0)
RBC: 3.25 Mil/uL — ABNORMAL LOW (ref 3.87–5.11)
RDW: 14.7 % (ref 11.5–15.5)
WBC: 4.6 10*3/uL (ref 4.0–10.5)

## 2022-08-04 LAB — MAGNESIUM: Magnesium: 1.9 mg/dL (ref 1.5–2.5)

## 2022-08-04 LAB — VITAMIN B1: Vitamin B1 (Thiamine): 110.4 nmol/L (ref 66.5–200.0)

## 2022-08-04 LAB — PROTIME-INR
INR: 1
Prothrombin Time: 10.9 s (ref 9.0–11.5)

## 2022-08-04 LAB — PHOSPHORUS: Phosphorus: 1.5 mg/dL — ABNORMAL LOW (ref 2.3–4.6)

## 2022-08-04 MED ORDER — K PHOS MONO-SOD PHOS DI & MONO 155-852-130 MG PO TABS: 250.0000 mg | ORAL_TABLET | Freq: Three times a day (TID) | ORAL | 0 refills | Status: AC

## 2022-08-04 NOTE — Telephone Encounter (Signed)
Lft VM to rtn call regarding lab results.   Per Dr Gena Fray:  labs are improving except the phosphorus levels.  He will send in an prescription and wants for her to drink 2 glasses of 8 oz skim milk a day to help as well.  Dm/cma

## 2022-08-04 NOTE — Addendum Note (Signed)
Addended by: Haydee Salter on: 08/04/2022 06:17 PM   Modules accepted: Orders

## 2022-08-05 LAB — HEAVY METALS PROFILE, URINE
Arsenic (Total),U: NOT DETECTED ug/L (ref 0–9)
Creatinine(Crt),U: 0.13 g/L — ABNORMAL LOW (ref 0.30–3.00)
Lead, Rand Ur: NOT DETECTED ug/L (ref 0–49)
Mercury, Ur: NOT DETECTED ug/L (ref 0–19)

## 2022-08-06 ENCOUNTER — Encounter: Payer: Self-pay | Admitting: Family Medicine

## 2022-08-06 LAB — METHYLMALONIC ACID, SERUM: Methylmalonic Acid, Quantitative: 172 nmol/L (ref 0–378)

## 2022-08-09 ENCOUNTER — Telehealth: Payer: Self-pay | Admitting: Family Medicine

## 2022-08-09 NOTE — Telephone Encounter (Signed)
Pt stated she is returning your call

## 2022-08-11 NOTE — Telephone Encounter (Signed)
See other message.  Dm/cma

## 2022-08-11 NOTE — Telephone Encounter (Signed)
Left VM to rtn call. Dm/cma

## 2022-08-11 NOTE — Telephone Encounter (Signed)
Jeanette Elliott [phone] from [facility]. She stated pt case will be closed after the next call

## 2022-08-11 NOTE — Telephone Encounter (Signed)
Patient scheduled 08/16/22 @ 11:00 am.  Dm/cma

## 2022-08-12 NOTE — Telephone Encounter (Signed)
Spoke to Guy, she has tried to reach out to patient multiple times and with try one more time to talk to her about getting case management for her recent hospital stay.   Place form on providers desk along with copy of last OV note, med list to be faxed back to her tomorrow.  Dm/cma

## 2022-08-12 NOTE — Telephone Encounter (Signed)
Left VM to rtn call. Dm/cma

## 2022-08-12 NOTE — Telephone Encounter (Signed)
Left VM to rtn call to get information on more information on what is need on this treatment plan that was sent to [place].  Dm/cma

## 2022-08-13 NOTE — Telephone Encounter (Signed)
Case management form, OV note and med list faxed today. Dm/cma

## 2022-08-16 ENCOUNTER — Ambulatory Visit (INDEPENDENT_AMBULATORY_CARE_PROVIDER_SITE_OTHER): Payer: Commercial Managed Care - HMO | Admitting: Family Medicine

## 2022-08-16 ENCOUNTER — Encounter: Payer: Self-pay | Admitting: Family Medicine

## 2022-08-16 VITALS — BP 124/70 | HR 85 | Temp 97.7°F | Ht 67.0 in | Wt 161.4 lb

## 2022-08-16 DIAGNOSIS — R600 Localized edema: Secondary | ICD-10-CM | POA: Diagnosis not present

## 2022-08-16 DIAGNOSIS — K7030 Alcoholic cirrhosis of liver without ascites: Secondary | ICD-10-CM | POA: Diagnosis not present

## 2022-08-16 LAB — BASIC METABOLIC PANEL
BUN: 5 mg/dL — ABNORMAL LOW (ref 6–23)
CO2: 27 mEq/L (ref 19–32)
Calcium: 8.7 mg/dL (ref 8.4–10.5)
Chloride: 96 mEq/L (ref 96–112)
Creatinine, Ser: 0.6 mg/dL (ref 0.40–1.20)
GFR: 112.53 mL/min (ref >60.00)
Glucose, Bld: 88 mg/dL (ref 70–99)
Potassium: 3.7 mEq/L (ref 3.5–5.1)
Sodium: 136 mEq/L (ref 135–145)

## 2022-08-16 LAB — PHOSPHORUS: Phosphorus: 3.6 mg/dL (ref 2.3–4.6)

## 2022-08-16 MED ORDER — FUROSEMIDE 20 MG PO TABS: 20.0000 mg | ORAL_TABLET | Freq: Every day | ORAL | 3 refills | Status: AC

## 2022-08-16 NOTE — Progress Notes (Signed)
[facility] [address] [place] [address] Dept: [phone] Dept Fax: [phone]  Office Visit  Subjective:    Patient ID: Jeanette Elliott, female    DOB: June 20, 1981, 41 y.o..   MRN: [id number]  Chief Complaint  Patient presents with   Follow-up    F/u and blood work today.  Has HA today. Took Ibuprofen with little relief.  LT leg swollen.      History of Present Illness:  Patient is in today to reassess her status over the past week, following up from recent admission due to lactic acidosis as a complication of her cirrhosis. Jeanette Elliott does note that last week, she passed out one morning while trying to get up to go to work. She thinks this may have occurred when getting up to stand after she dressed. She thinks she was unconscious for quite a bit, as she ended up being late for work. He job has decided to fire her from her current position. Jeanette Elliott notes she is trying to eat and drink  consistently. She was noted last week to have a low phosphorous level. She is taking the K Phos I prescribed. Her diuretics were stopped during her last hospitalization. She is now noting increasing swelling of both lower legs.  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   Suicidal ideation 12/17/2021   MDD (major depressive disorder), recurrent, severe, with psychosis ([place]) 12/17/2021   Goiter 12/09/2021   Cholelithiasis 09/14/2021   Peripheral vascular disease ([place]) 09/09/2021   Osteopenia determined by x-ray 09/09/2021   Hip impingement syndrome, left  06/18/2021   Sacroiliac joint dysfunction of right side 06/18/2021   B12 deficiency 05/06/2021   Normocytic anemia 05/06/2021   Duodenal ulcer 05/05/2021   Asthma 05/05/2021   Hymenoptera allergy 05/05/2021   Alcohol dependence in remission ([place]) 03/04/2021   Hypophosphatemia    Anxiety    Cirrhosis ([place]) 02/02/2021   S/P thoracentesis    Closed fracture of rib of left side with routine healing    Fall    Dysplasia of cervix, low grade (CIN 1) 09/22/2016   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   Sjogren's syndrome ([place]) 07/20/2016   Past Surgical History:  Procedure Laterality Date   IR PARACENTESIS  02/03/2021   IR THORACENTESIS ASP PLEURAL SPACE W/IMG GUIDE  01/16/2021   ORIF FINGER / THUMB FRACTURE Left    Thumb   SUBMANDIBULAR GLAND EXCISION     TONSILLECTOMY AND ADENOIDECTOMY     Family History  Problem Relation Age of Onset   Arthritis Mother    Heart disease Father    Cancer Father        Prostate   Arthritis Father    Heart disease Maternal Grandmother    Stroke Maternal Grandmother    Depression Maternal Grandmother    Alzheimer's disease Maternal Grandfather    Heart disease Paternal Grandfather    Heart disease Paternal Uncle    Colon cancer Neg Hx    Esophageal cancer Neg Hx    Outpatient Medications Prior to Visit  Medication Sig Dispense Refill   albuterol (VENTOLIN HFA) 108 (  90 Base) MCG/ACT inhaler Inhale 2 puffs into the lungs every 6 (six) hours as needed (asthma attacks). 8 g 1   Beta Carotene (VITAMIN A) 25000 UNIT capsule Take 25,000 Units by mouth daily.     cariprazine (VRAYLAR) 1.5 MG capsule Take 1 capsule (1.5 mg total) by mouth daily. 30 capsule 3   cyanocobalamin (,VITAMIN B-12,) 1000 MCG/ML injection Inject 1,000 mcg into the muscle every 30 (thirty) days.     EPINEPHrine 0.3 mg/0.3 mL IJ SOAJ injection Inject 0.3 mg into the muscle once as needed for anaphylaxis. 1 each 0   escitalopram (LEXAPRO) 20 MG tablet Take 1 tablet  (20 mg total) by mouth daily. 90 tablet 2   folic acid (FOLVITE) 1 MG tablet TAKE 1 TABLET(1 MG) BY MOUTH DAILY (Patient taking differently: Take 1 mg by mouth daily.) 90 tablet 1   gabapentin (NEURONTIN) 600 MG tablet Take 600 mg by mouth 3 (three) times daily.     hydrOXYzine (ATARAX) 25 MG tablet Take 1 tablet (25 mg total) by mouth 2 (two) times daily. 60 tablet 1   Multiple Vitamin (MULTIVITAMIN WITH MINERALS) TABS tablet Take 1 tablet by mouth daily.     Polyethyl Glycol-Propyl Glycol (SYSTANE FREE OP) Apply 1 drop to eye daily as needed (dryness).     pregabalin (LYRICA) 25 MG capsule TAKE ONE CAPSULE BY MOUTH TWICE A DAY 60 capsule 11   thiamine (VITAMIN B1) 100 MG tablet Take 1 tablet (100 mg total) by mouth daily. 30 tablet 0   traZODone (DESYREL) 50 MG tablet Take 1 tablet (50 mg total) by mouth at bedtime as needed for sleep.     Vitamin D, Ergocalciferol, (DRISDOL) 1.25 MG (50000 UNIT) CAPS capsule Take 1 capsule (50,000 Units total) by mouth once a week. 4 capsule 0   ZOLMitriptan (ZOMIG) 2.5 MG tablet Take 1 tablet (2.5 mg total) by mouth once for 1 dose. May repeat in 2 hours if headache persists or recurs. 20 tablet 3   cholecalciferol (VITAMIN D3) 25 MCG (1000 UNIT) tablet Take 1,000 Units by mouth daily. (Patient not taking: Reported on 08/16/2022)     No facility-administered medications prior to visit.   Allergies  Allergen Reactions   Iodinated Contrast Media Itching   Latex Itching and Rash   Keflex [Cephalexin] Diarrhea and Nausea And Vomiting     Objective:   Today's Vitals   08/16/22 1104  BP: 124/70  Pulse: 85  Temp: 97.7 F (36.5 C)  TempSrc: Temporal  SpO2: 99%  Weight: 161 lb 6.4 oz (73.2 kg)  Height: 5' 7"  (1.702 m)   Body mass index is 25.28 kg/m.   General: Well developed, well nourished. No acute distress. Lungs: Clear to auscultation bilaterally. No wheezing, rales or rhonchi. CV: RRR without murmurs or rubs. Pulses 2+ bilaterally. Abdomen:  Soft. Mild, diffuse tenderness.tender. Bowel sounds positive, normal pitch and   frequency. No hepatosplenomegaly. No rebound or guarding. Extremities: 2+ edema noted bilaterally. Psych: Alert and oriented. Normal mood and affect.  Health Maintenance Due  Topic Date Due   PAP SMEAR-Modifier  Never done   Lab Results Last CBC Lab Results  Component Value Date   WBC 4.6 08/03/2022   HGB 12.0 08/03/2022   HCT 34.9 (L) 08/03/2022   MCV 107.4 (H) 08/03/2022   MCH 36.9 (H) 07/31/2022   RDW 14.7 08/03/2022   PLT 111.0 (L) 12/75/1700   Last metabolic panel Lab Results  Component Value Date   GLUCOSE 97 08/03/2022  NA 137 08/03/2022   K 4.0 08/03/2022   CL 100 08/03/2022   CO2 31 08/03/2022   BUN 6 08/03/2022   CREATININE 0.62 08/03/2022   GFRNONAA >60 08/01/2022   CALCIUM 9.1 08/03/2022   PHOS 1.5 (L) 08/03/2022   PROT 6.4 08/03/2022   ALBUMIN 3.9 08/03/2022   LABGLOB 3.3 05/20/2021   BILITOT 1.5 (H) 08/03/2022   ALKPHOS 356 (H) 08/03/2022   AST 127 (H) 08/03/2022   ALT 53 (H) 08/03/2022   ANIONGAP 12 08/01/2022   Component Ref Range & Units 13 d ago (08/03/22) 2 wk ago (08/01/22) 2 wk ago (07/30/22) 1 yr ago (05/05/21) 1 yr ago (02/26/21) 1 yr ago (02/26/21)  Phosphorus 2.3 - 4.6 mg/dL 1.5 Low  2.5 R, CM 3.2 R, CM 5.9 High  2.7 R 2.3 Low  R    MELD= 8 Child-Pugh Score= 5 (Class A)    Assessment & Plan:   1. Alcoholic cirrhosis of liver without ascites ([place]) Despite the recent increase in liver enzymes, the hepatic function remains good. Jeanette Elliott is low risk for mortality related to her cirrhosis at present. It is anticipated that she may have periodic decompensation of her cirrhosis. Strongly recommend ongoing abstinence.  - Basic metabolic panel  2. Hypophosphatemia The phosphorous was back to being low last week. She is now on K Phos. We will recheck her phosphorous level today.  - Phosphorus  3. Pedal edema I will cautiously restart Jeanette Elliott on a loop  diuretic.  - furosemide (LASIX) 20 MG tablet; Take 1 tablet (20 mg total) by mouth daily.  Dispense: 30 tablet; Refill: 3   Return in about 4 weeks (around 09/13/2022) for Reassessment.   Haydee Salter, MD

## 2022-08-27 IMAGING — US US ABDOMEN LIMITED RUQ/ASCITES
1 series · 13 of 25 positions shown · non-contrast
Comparison: None.

CLINICAL DATA: Upper abdominal pain

EXAM:
ULTRASOUND ABDOMEN LIMITED RIGHT UPPER QUADRANT

[Series 1: us abdomen limited ruq/ascites · 13 of 34 slices shown]
[im 1/34]
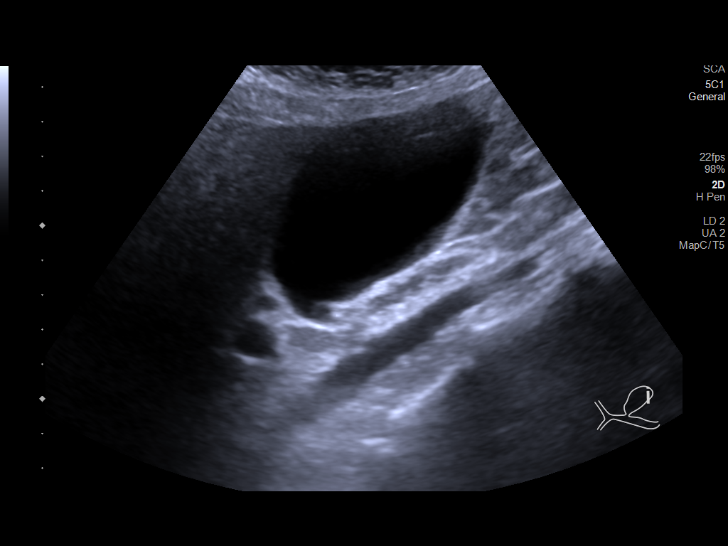
[im 3/34]
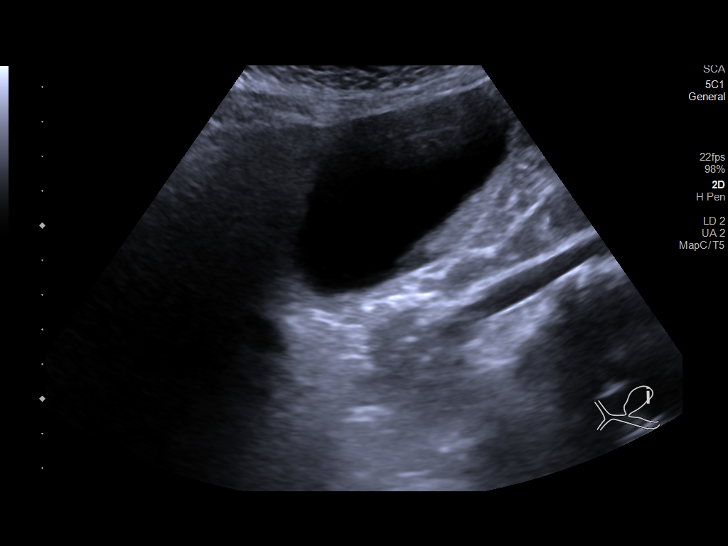
[im 6/34]
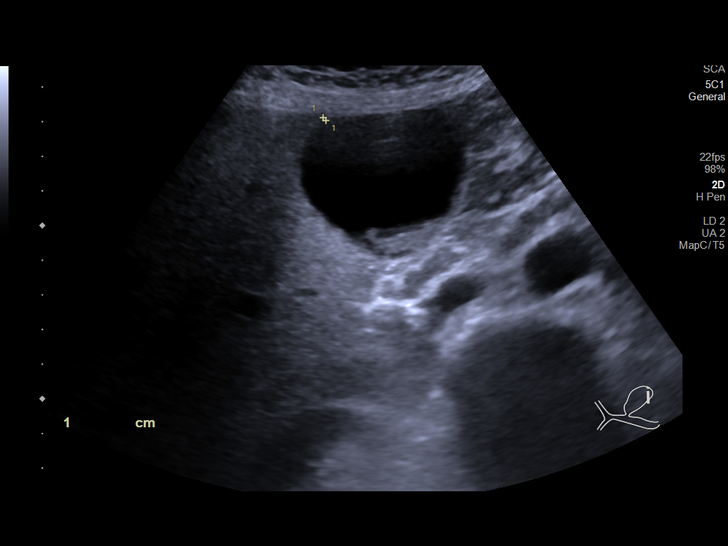
[im 9/34]
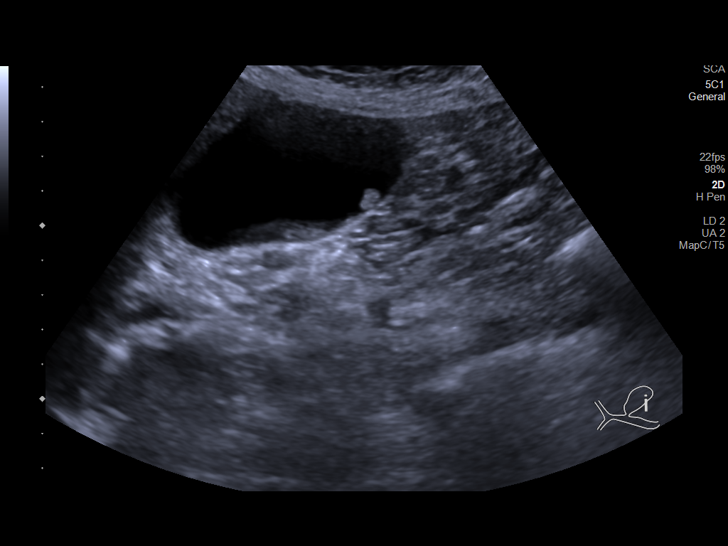
[im 12/34]
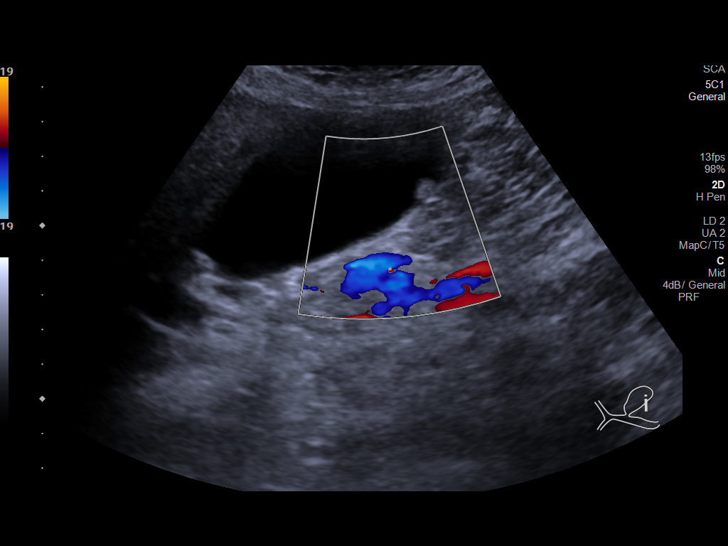
[im 14/34]
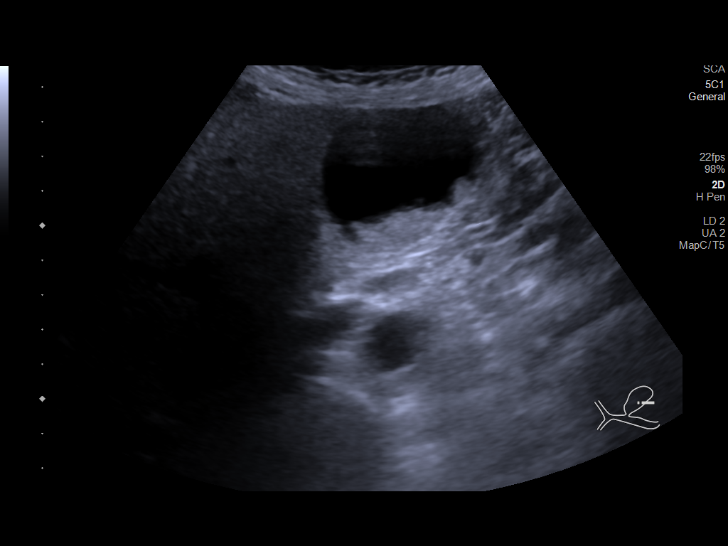
[im 17/34]
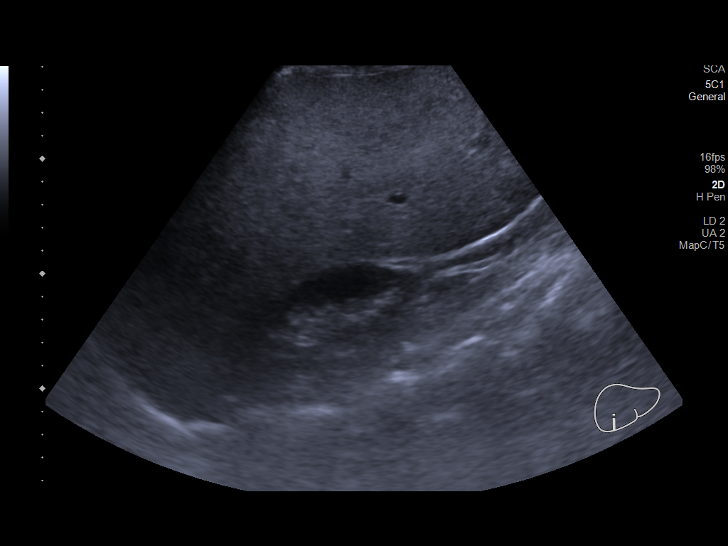
[im 20/34]
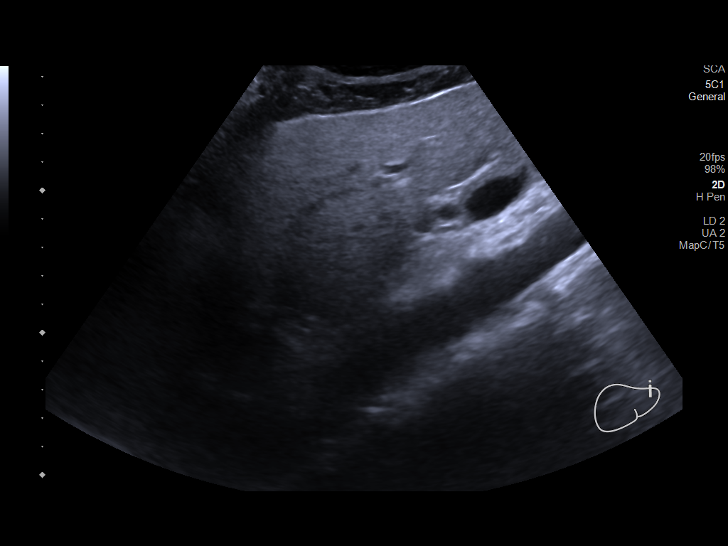
[im 23/34]
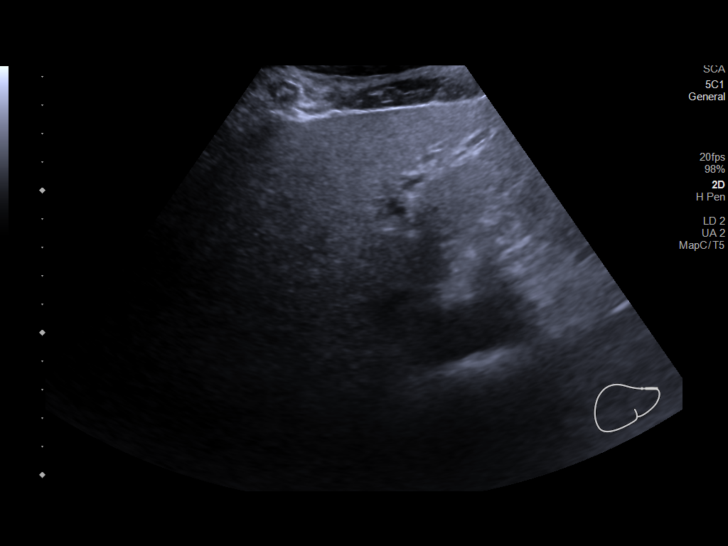
[im 25/34]
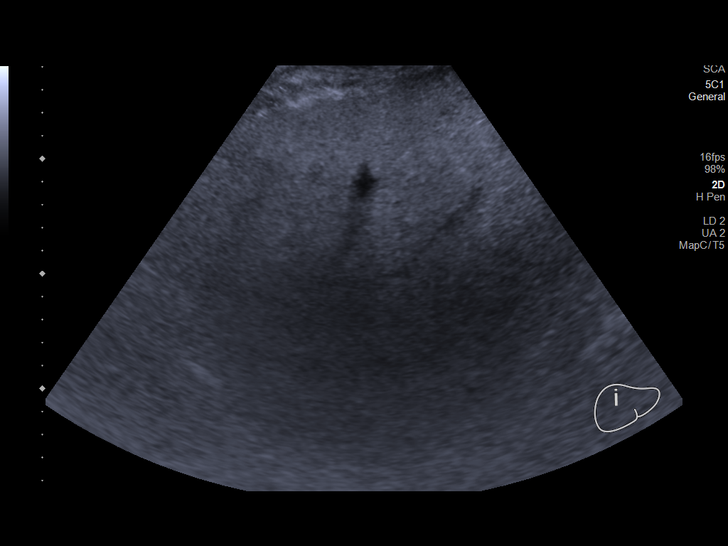
[im 28/34]
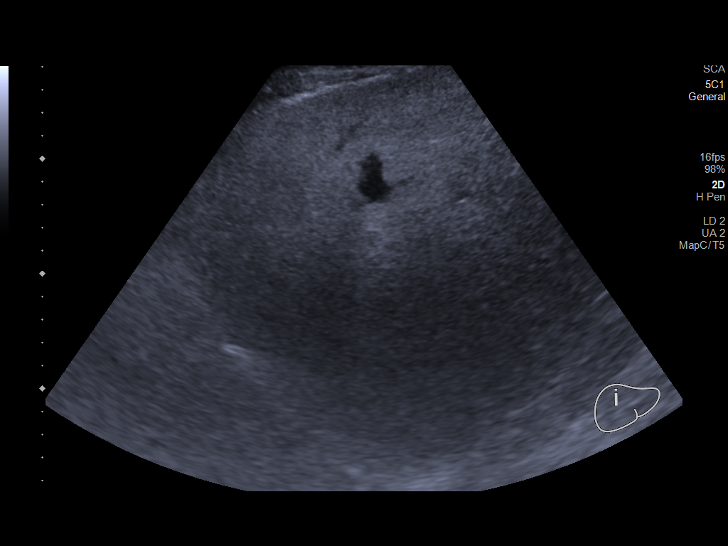
[im 31/34]
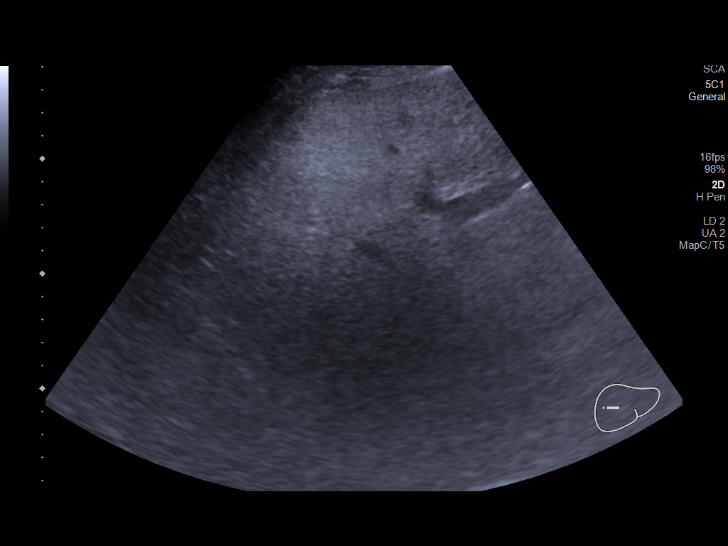
[im 34/34]
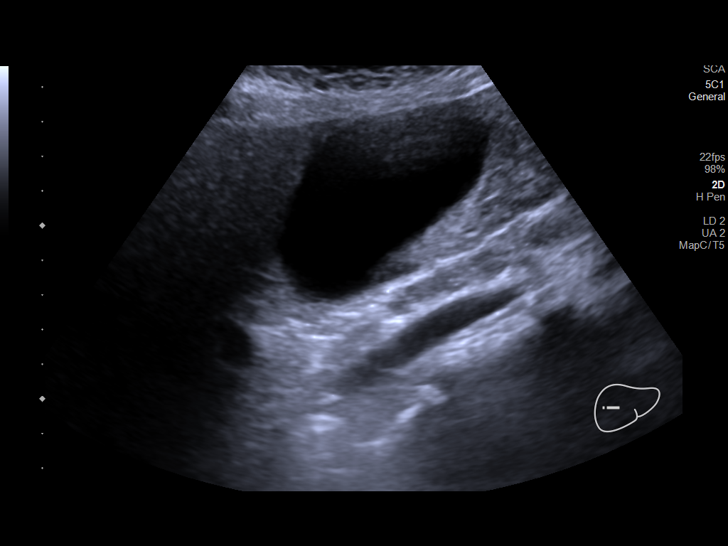

[13 of 25 positions shown; findings below may reference images not displayed]

FINDINGS: Gallbladder:

There is sludge in the gallbladder. There is a 6 mm echogenic focus
in the gallbladder which neither moves nor shadows, a presumed
polyp. There are no echogenic foci in the gallbladder which move and
shadow as is expected with cholelithiasis. There is no gallbladder
wall thickening or pericholecystic fluid. No sonographic Murphy sign
noted by sonographer.

Common bile duct:

Diameter: 2 mm. No intrahepatic or extrahepatic biliary duct
dilatation.

Liver:

No focal lesion identified. Liver echogenicity is increased
diffusely. Portal vein is patent on color Doppler imaging with
normal direction of blood flow towards the liver.

Other: None.
IMPRESSION: 1. A 6 mm apparent polyp within the gallbladder. Per consensus
guidelines, a polyp of this size does not warrant additional imaging
surveillance. There is sludge in the gallbladder. No evident
gallstones. No gallbladder wall thickening or pericholecystic fluid.

2. Diffuse increase in liver echogenicity, a finding indicative of
hepatic steatosis. No focal liver lesions evident on this study. It
must be cautioned that the sensitivity of ultrasound for detection
of focal liver lesions is diminished in this circumstance.

## 2022-08-30 ENCOUNTER — Ambulatory Visit: Payer: Commercial Managed Care - HMO | Admitting: Psychiatry

## 2022-09-13 DEATH — deceased

## 2022-09-21 ENCOUNTER — Telehealth: Payer: Self-pay | Admitting: Family Medicine

## 2022-09-21 ENCOUNTER — Ambulatory Visit: Payer: Commercial Managed Care - HMO | Admitting: Behavioral Health

## 2022-09-21 ENCOUNTER — Ambulatory Visit: Payer: Commercial Managed Care - HMO | Admitting: Family Medicine

## 2022-09-21 NOTE — Telephone Encounter (Signed)
Pt was a no show 1/09 for OV with Dr. Gena Fray. This is her second (11/2021)

## 2022-09-25 IMAGING — US IR ABDOMEN US LIMITED
1 series · 3 of 3 positions shown · non-contrast
Comparison: CT abdomen pelvis from 01/15/2021

CLINICAL DATA: 39-year-old female with jaundice, weight loss,
fatigue and minimal ascites on recent CT abdomen pelvis.

EXAM:
LIMITED ABDOMEN ULTRASOUND FOR ASCITES
TECHNIQUE: Limited ultrasound survey for ascites was performed in all four
abdominal quadrants.

[Series 1: ir (id) (id)/(id)/(id) ir · 3 of 3 slices shown]
[im 1/3]
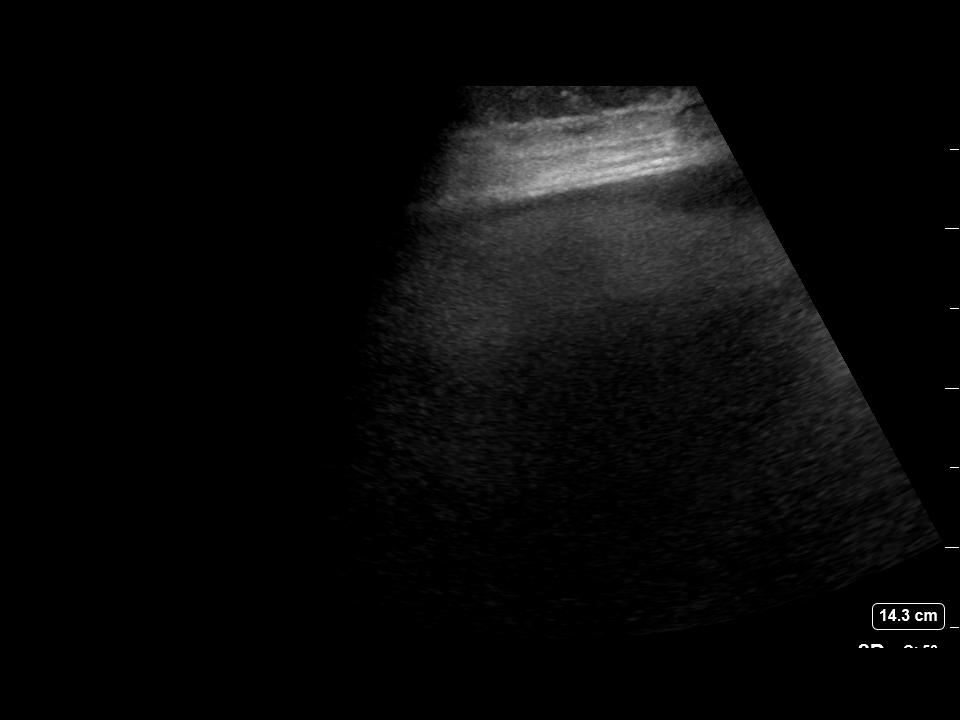
[im 2/3]
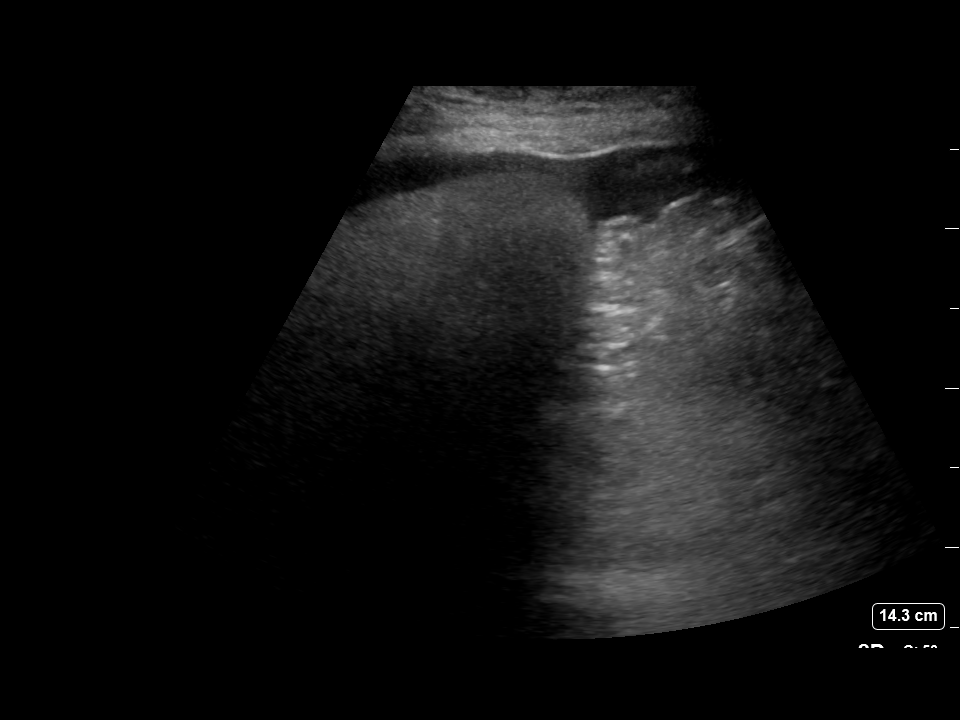
[im 3/3]
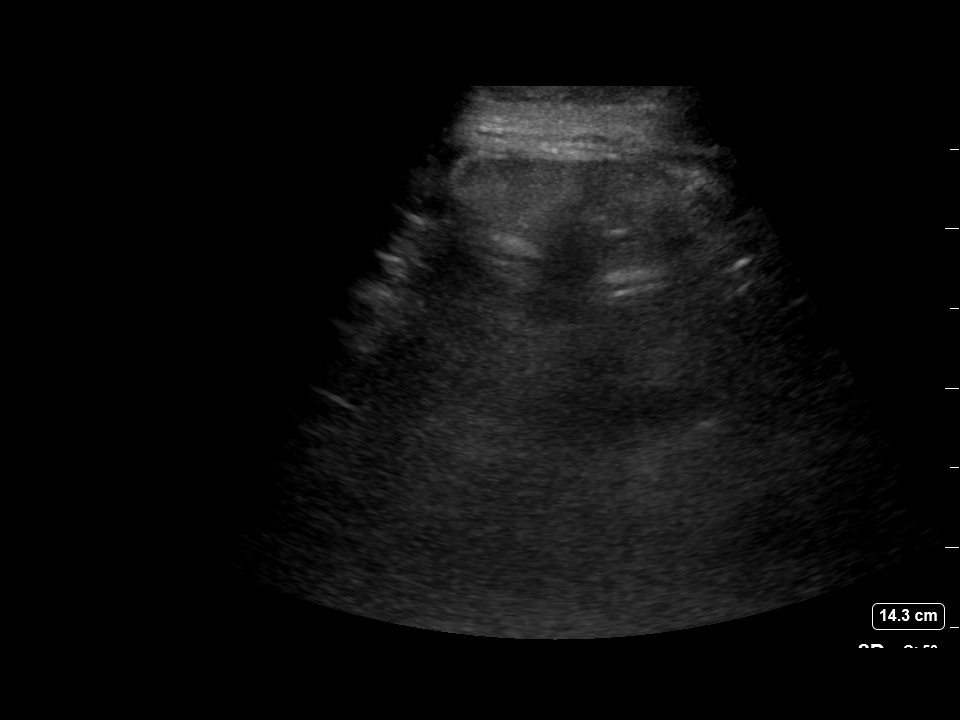

[3 of 3 positions shown; findings below may reference images not displayed]

FINDINGS: Trace perihepatic ascites.  No safe window for paracentesis.
IMPRESSION: Trace perihepatic ascites, no safe window for paracentesis.

## 2022-09-28 ENCOUNTER — Encounter: Payer: Self-pay | Admitting: Family Medicine

## 2022-09-28 NOTE — Telephone Encounter (Signed)
2nd no show, fee generated, final warning letter sent 

## 2022-09-30 NOTE — Telephone Encounter (Signed)
Pts mother, Olegario Shearer, picked up her daughters mail and called to notify us that the patient passed away. They found her in her home on Sep 17, 2022 and they do not have a cause of death at this time.

## 2022-10-13 IMAGING — US IR PARACENTESIS
1 series · 5 of 5 positions shown · non-contrast
Comparison: none

INDICATION: Patient with history of Mayandi disease, anemia, fatty liver,
elevated liver function tests, ascites; request received for
diagnostic and therapeutic paracentesis up to 5 liters.

[Series 1: ir (id) (id)/(id)/(id) ir · 5 of 5 slices shown]
[im 1/5]
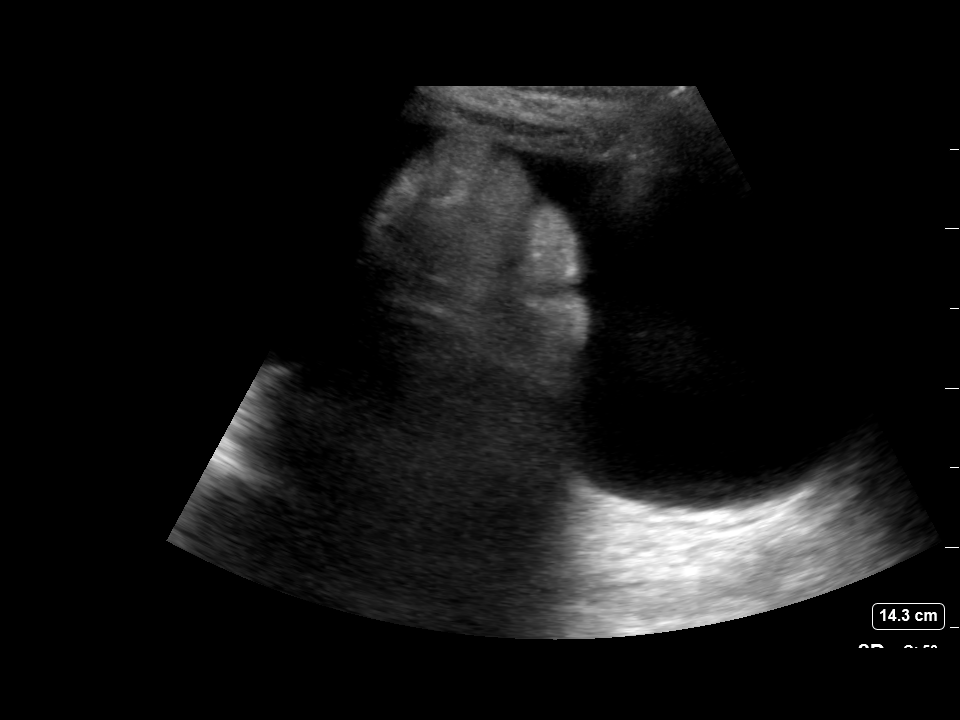
[im 2/5]
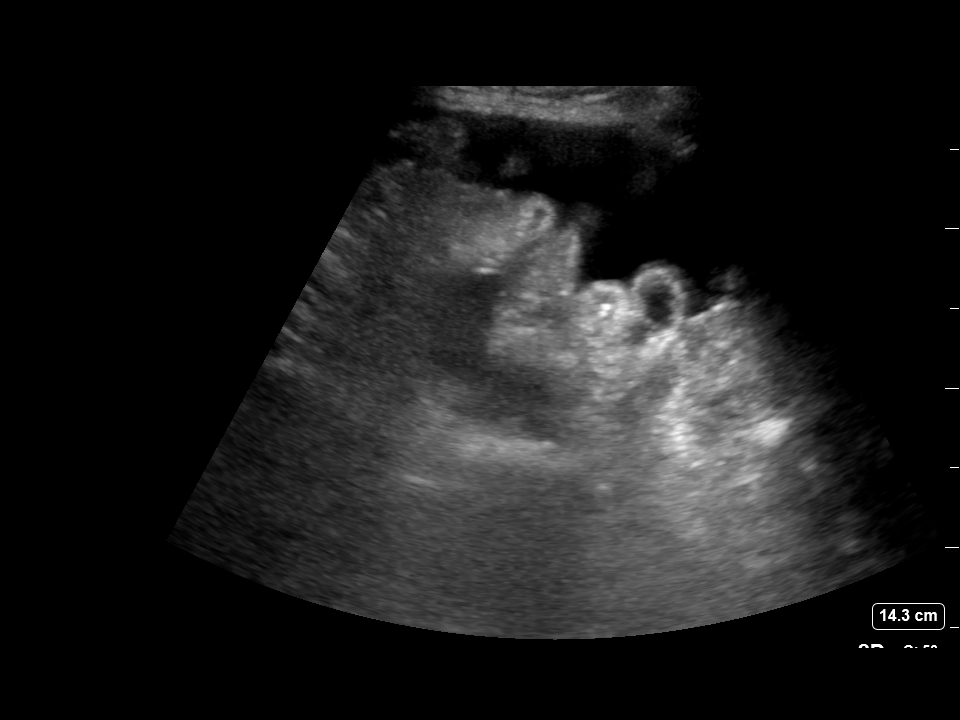
[im 3/5]
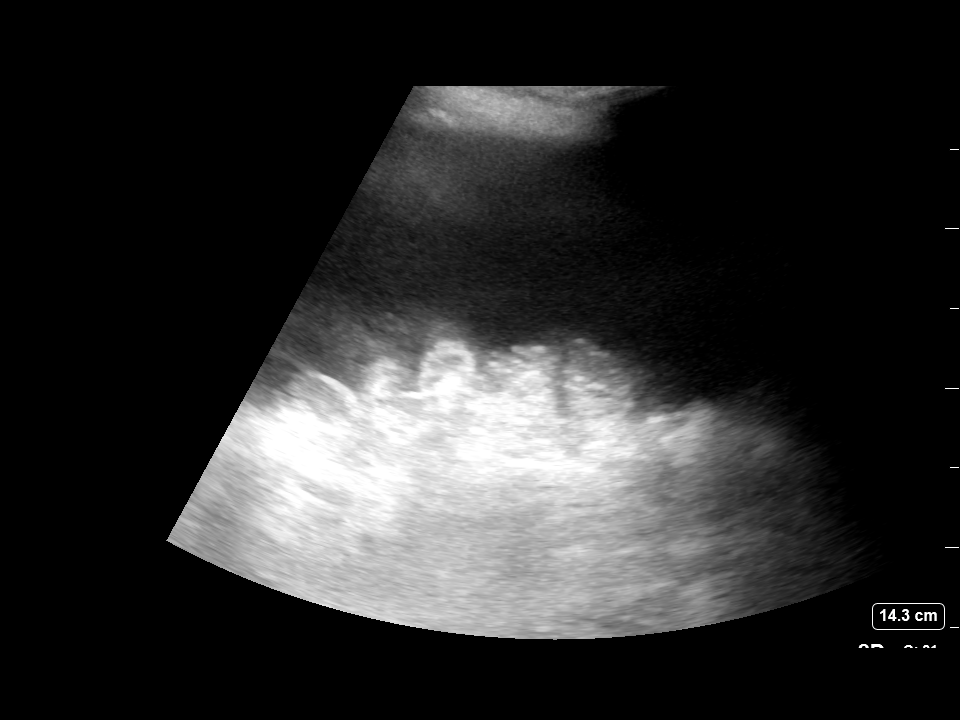
[im 4/5]
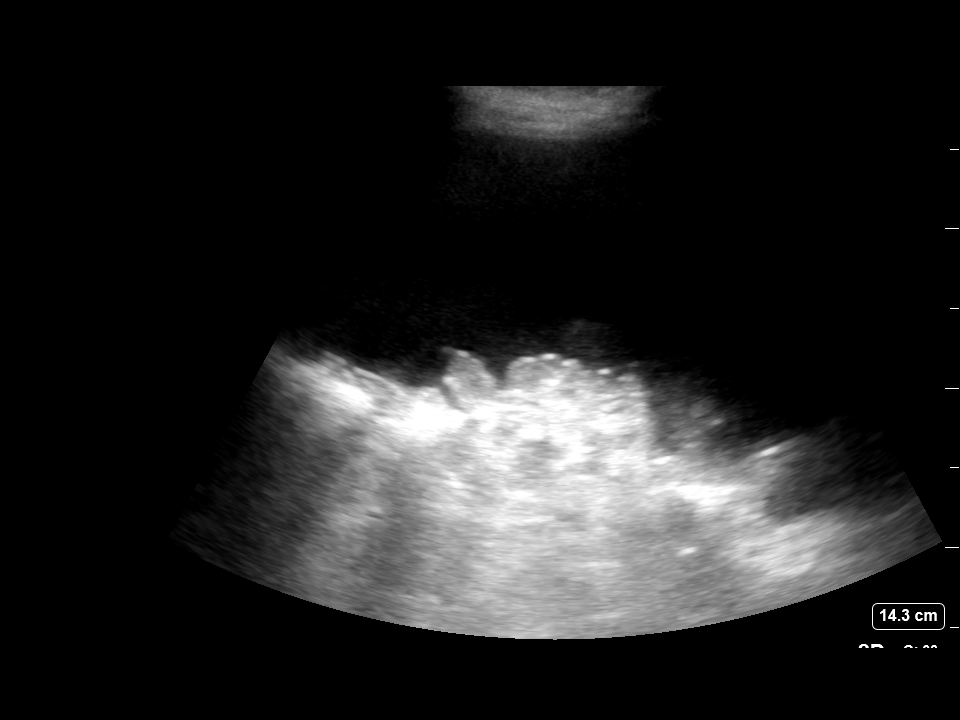
[im 5/5]
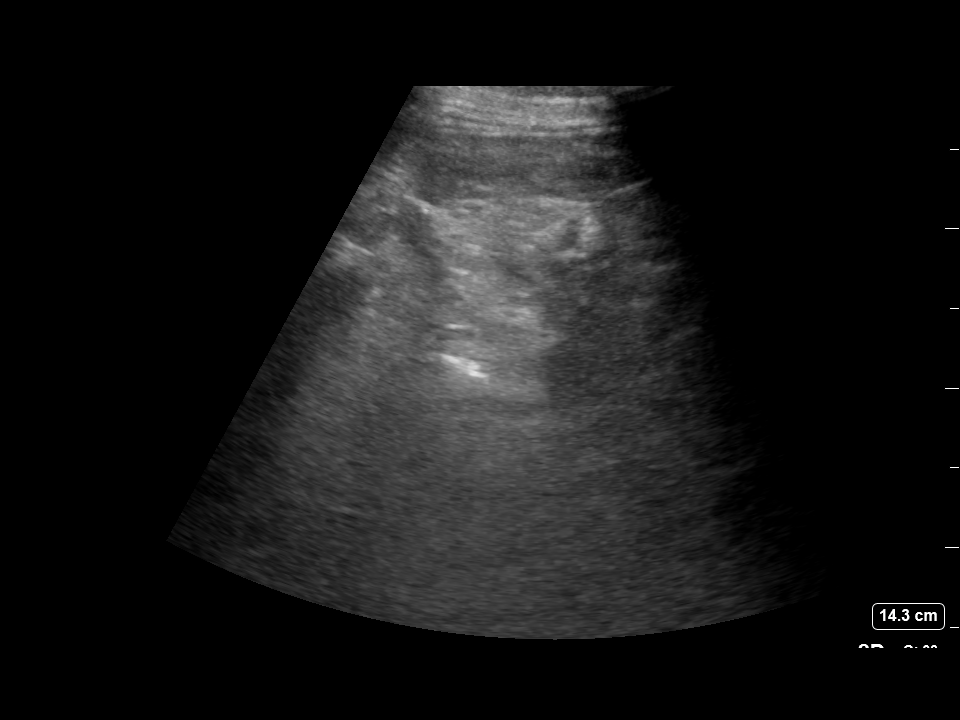

[5 of 5 positions shown; findings below may reference images not displayed]

EXAM:
ULTRASOUND GUIDED DIAGNOSTIC AND THERAPEUTIC PARACENTESIS

MEDICATIONS:
1% lidocaine to skin and subcutaneous tissue

COMPLICATIONS:
None immediate.

PROCEDURE:
Informed written consent was obtained from the patient after a
discussion of the risks, benefits and alternatives to treatment. A
timeout was performed prior to the initiation of the procedure.

Initial ultrasound scanning demonstrates a small to moderate amount
of ascites within the left mid to lower abdominal quadrant. The left
mid to lower abdomen was prepped and draped in the usual sterile
fashion. 1% lidocaine was used for local anesthesia.

Following this, a 19 gauge, 7-cm, Yueh catheter was introduced. An
ultrasound image was saved for documentation purposes. The
paracentesis was performed. The catheter was removed and a dressing
was applied. The patient tolerated the procedure well without
immediate post procedural complication.
FINDINGS: A total of approximately 2.4 liters of hazy, amber fluid was
removed. Samples were sent to the laboratory as requested by the
clinical team.
IMPRESSION: Successful ultrasound-guided diagnostic and therapeutic paracentesis
yielding 2.4 liters of peritoneal fluid.
